# Patient Record
Sex: Male | Born: 1945 | Race: White | Hispanic: No | Marital: Married | Smoking: Former smoker
Health system: Southern US, Community
[De-identification: ages and names within clinical notes are randomized; demographics above are authoritative.]

## PROBLEM LIST (undated history)

## (undated) DIAGNOSIS — Z960 Presence of urogenital implants: Secondary | ICD-10-CM

## (undated) DIAGNOSIS — Z87442 Personal history of urinary calculi: Secondary | ICD-10-CM

## (undated) DIAGNOSIS — Z9289 Personal history of other medical treatment: Secondary | ICD-10-CM

## (undated) DIAGNOSIS — N320 Bladder-neck obstruction: Secondary | ICD-10-CM

## (undated) DIAGNOSIS — I2510 Atherosclerotic heart disease of native coronary artery without angina pectoris: Secondary | ICD-10-CM

## (undated) DIAGNOSIS — Z87448 Personal history of other diseases of urinary system: Secondary | ICD-10-CM

## (undated) DIAGNOSIS — G2581 Restless legs syndrome: Secondary | ICD-10-CM

## (undated) DIAGNOSIS — M1990 Unspecified osteoarthritis, unspecified site: Secondary | ICD-10-CM

## (undated) DIAGNOSIS — N183 Chronic kidney disease, stage 3 unspecified: Secondary | ICD-10-CM

## (undated) DIAGNOSIS — I341 Nonrheumatic mitral (valve) prolapse: Secondary | ICD-10-CM

## (undated) DIAGNOSIS — Z973 Presence of spectacles and contact lenses: Secondary | ICD-10-CM

## (undated) DIAGNOSIS — F32A Depression, unspecified: Secondary | ICD-10-CM

## (undated) DIAGNOSIS — Z87898 Personal history of other specified conditions: Secondary | ICD-10-CM

## (undated) DIAGNOSIS — I700 Atherosclerosis of aorta: Secondary | ICD-10-CM

## (undated) DIAGNOSIS — Z9889 Other specified postprocedural states: Secondary | ICD-10-CM

## (undated) DIAGNOSIS — G250 Essential tremor: Secondary | ICD-10-CM

## (undated) DIAGNOSIS — R911 Solitary pulmonary nodule: Secondary | ICD-10-CM

## (undated) DIAGNOSIS — K649 Unspecified hemorrhoids: Secondary | ICD-10-CM

## (undated) DIAGNOSIS — Z974 Presence of external hearing-aid: Secondary | ICD-10-CM

## (undated) DIAGNOSIS — N400 Enlarged prostate without lower urinary tract symptoms: Secondary | ICD-10-CM

## (undated) DIAGNOSIS — K219 Gastro-esophageal reflux disease without esophagitis: Secondary | ICD-10-CM

## (undated) DIAGNOSIS — I10 Essential (primary) hypertension: Secondary | ICD-10-CM

## (undated) DIAGNOSIS — I38 Endocarditis, valve unspecified: Secondary | ICD-10-CM

## (undated) DIAGNOSIS — Z978 Presence of other specified devices: Secondary | ICD-10-CM

## (undated) DIAGNOSIS — N1330 Unspecified hydronephrosis: Secondary | ICD-10-CM

## (undated) DIAGNOSIS — E559 Vitamin D deficiency, unspecified: Secondary | ICD-10-CM

## (undated) DIAGNOSIS — N529 Male erectile dysfunction, unspecified: Secondary | ICD-10-CM

## (undated) DIAGNOSIS — Z85820 Personal history of malignant melanoma of skin: Secondary | ICD-10-CM

## (undated) DIAGNOSIS — F329 Major depressive disorder, single episode, unspecified: Secondary | ICD-10-CM

## (undated) DIAGNOSIS — F419 Anxiety disorder, unspecified: Secondary | ICD-10-CM

## (undated) DIAGNOSIS — M5416 Radiculopathy, lumbar region: Secondary | ICD-10-CM

## (undated) DIAGNOSIS — R5383 Other fatigue: Secondary | ICD-10-CM

## (undated) HISTORY — DX: Essential (primary) hypertension: I10

## (undated) HISTORY — DX: Depression, unspecified: F32.A

## (undated) HISTORY — DX: Other fatigue: R53.83

## (undated) HISTORY — DX: Radiculopathy, lumbar region: M54.16

## (undated) HISTORY — DX: Vitamin D deficiency, unspecified: E55.9

## (undated) HISTORY — PX: TONSILLECTOMY: SUR1361

## (undated) HISTORY — DX: Endocarditis, valve unspecified: I38

## (undated) HISTORY — DX: Restless legs syndrome: G25.81

## (undated) HISTORY — DX: Major depressive disorder, single episode, unspecified: F32.9

## (undated) HISTORY — DX: Unspecified hemorrhoids: K64.9

## (undated) HISTORY — PX: INGUINAL HERNIA REPAIR: SUR1180

## (undated) HISTORY — PX: FINGER SURGERY: SHX640

## (undated) HISTORY — DX: Male erectile dysfunction, unspecified: N52.9

---

## 1995-04-06 DIAGNOSIS — Z87898 Personal history of other specified conditions: Secondary | ICD-10-CM

## 1995-04-06 HISTORY — DX: Personal history of other specified conditions: Z87.898

## 1995-04-06 HISTORY — PX: TRANSPHENOIDAL PITUITARY RESECTION: SHX2572

## 1997-07-10 ENCOUNTER — Ambulatory Visit (HOSPITAL_COMMUNITY): Admission: RE | Admit: 1997-07-10 | Discharge: 1997-07-10 | Payer: Self-pay | Admitting: Neurosurgery

## 1997-09-23 ENCOUNTER — Encounter: Admission: RE | Admit: 1997-09-23 | Discharge: 1997-09-23 | Payer: Self-pay | Admitting: *Deleted

## 1997-09-23 ENCOUNTER — Emergency Department (HOSPITAL_COMMUNITY): Admission: EM | Admit: 1997-09-23 | Discharge: 1997-09-23 | Payer: Self-pay

## 1998-12-10 ENCOUNTER — Ambulatory Visit (HOSPITAL_COMMUNITY): Admission: RE | Admit: 1998-12-10 | Discharge: 1998-12-10 | Payer: Self-pay | Admitting: Neurosurgery

## 1998-12-10 ENCOUNTER — Encounter: Payer: Self-pay | Admitting: Neurosurgery

## 2001-01-01 ENCOUNTER — Emergency Department (HOSPITAL_COMMUNITY): Admission: EM | Admit: 2001-01-01 | Discharge: 2001-01-01 | Payer: Self-pay | Admitting: Emergency Medicine

## 2001-01-01 ENCOUNTER — Encounter: Payer: Self-pay | Admitting: Emergency Medicine

## 2001-01-09 ENCOUNTER — Ambulatory Visit (HOSPITAL_BASED_OUTPATIENT_CLINIC_OR_DEPARTMENT_OTHER): Admission: RE | Admit: 2001-01-09 | Discharge: 2001-01-09 | Payer: Self-pay | Admitting: Orthopedic Surgery

## 2013-05-10 ENCOUNTER — Other Ambulatory Visit: Payer: Self-pay | Admitting: Nurse Practitioner

## 2013-05-10 ENCOUNTER — Ambulatory Visit
Admission: RE | Admit: 2013-05-10 | Discharge: 2013-05-10 | Disposition: A | Discharge status: Home / Self Care | Payer: PRIVATE HEALTH INSURANCE | Source: Ambulatory Visit | Attending: Nurse Practitioner | Admitting: Nurse Practitioner

## 2013-05-10 DIAGNOSIS — J069 Acute upper respiratory infection, unspecified: Secondary | ICD-10-CM

## 2013-05-11 ENCOUNTER — Other Ambulatory Visit: Payer: Self-pay | Admitting: Internal Medicine

## 2013-05-11 DIAGNOSIS — R911 Solitary pulmonary nodule: Secondary | ICD-10-CM

## 2013-05-12 ENCOUNTER — Encounter: Payer: Self-pay | Admitting: *Deleted

## 2013-05-12 DIAGNOSIS — G2581 Restless legs syndrome: Secondary | ICD-10-CM

## 2013-05-12 DIAGNOSIS — G4700 Insomnia, unspecified: Secondary | ICD-10-CM | POA: Insufficient documentation

## 2013-05-12 DIAGNOSIS — E782 Mixed hyperlipidemia: Secondary | ICD-10-CM | POA: Insufficient documentation

## 2013-05-12 DIAGNOSIS — N400 Enlarged prostate without lower urinary tract symptoms: Secondary | ICD-10-CM | POA: Insufficient documentation

## 2013-05-12 DIAGNOSIS — F32A Depression, unspecified: Secondary | ICD-10-CM | POA: Insufficient documentation

## 2013-05-12 DIAGNOSIS — F329 Major depressive disorder, single episode, unspecified: Secondary | ICD-10-CM | POA: Insufficient documentation

## 2013-05-17 ENCOUNTER — Ambulatory Visit
Admission: RE | Admit: 2013-05-17 | Discharge: 2013-05-17 | Disposition: A | Discharge status: Home / Self Care | Payer: PRIVATE HEALTH INSURANCE | Source: Ambulatory Visit | Attending: Internal Medicine | Admitting: Internal Medicine

## 2013-05-17 DIAGNOSIS — R911 Solitary pulmonary nodule: Secondary | ICD-10-CM

## 2013-05-17 MED ORDER — IOHEXOL 300 MG/ML  SOLN
75.0000 mL | Freq: Once | INTRAMUSCULAR | Status: AC | PRN
  Administered 2013-05-17: 75 mL via INTRAVENOUS

## 2015-04-22 DIAGNOSIS — L814 Other melanin hyperpigmentation: Secondary | ICD-10-CM | POA: Diagnosis not present

## 2015-04-22 DIAGNOSIS — L859 Epidermal thickening, unspecified: Secondary | ICD-10-CM | POA: Diagnosis not present

## 2015-04-22 DIAGNOSIS — D485 Neoplasm of uncertain behavior of skin: Secondary | ICD-10-CM | POA: Diagnosis not present

## 2015-04-22 DIAGNOSIS — L821 Other seborrheic keratosis: Secondary | ICD-10-CM | POA: Diagnosis not present

## 2015-04-22 DIAGNOSIS — T07 Unspecified multiple injuries: Secondary | ICD-10-CM | POA: Diagnosis not present

## 2015-04-22 DIAGNOSIS — Z85820 Personal history of malignant melanoma of skin: Secondary | ICD-10-CM | POA: Diagnosis not present

## 2015-05-27 DIAGNOSIS — L905 Scar conditions and fibrosis of skin: Secondary | ICD-10-CM | POA: Diagnosis not present

## 2015-05-27 DIAGNOSIS — D485 Neoplasm of uncertain behavior of skin: Secondary | ICD-10-CM | POA: Diagnosis not present

## 2015-06-20 DIAGNOSIS — M7022 Olecranon bursitis, left elbow: Secondary | ICD-10-CM | POA: Diagnosis not present

## 2015-06-20 DIAGNOSIS — K648 Other hemorrhoids: Secondary | ICD-10-CM | POA: Diagnosis not present

## 2015-06-20 DIAGNOSIS — K625 Hemorrhage of anus and rectum: Secondary | ICD-10-CM | POA: Diagnosis not present

## 2015-07-18 DIAGNOSIS — L980 Pyogenic granuloma: Secondary | ICD-10-CM | POA: Diagnosis not present

## 2015-07-18 DIAGNOSIS — D485 Neoplasm of uncertain behavior of skin: Secondary | ICD-10-CM | POA: Diagnosis not present

## 2015-08-21 DIAGNOSIS — M25522 Pain in left elbow: Secondary | ICD-10-CM | POA: Diagnosis not present

## 2015-10-06 DIAGNOSIS — I10 Essential (primary) hypertension: Secondary | ICD-10-CM | POA: Diagnosis not present

## 2015-10-06 DIAGNOSIS — E559 Vitamin D deficiency, unspecified: Secondary | ICD-10-CM | POA: Diagnosis not present

## 2015-10-06 DIAGNOSIS — F324 Major depressive disorder, single episode, in partial remission: Secondary | ICD-10-CM | POA: Diagnosis not present

## 2015-10-06 DIAGNOSIS — Z125 Encounter for screening for malignant neoplasm of prostate: Secondary | ICD-10-CM | POA: Diagnosis not present

## 2015-10-06 DIAGNOSIS — N400 Enlarged prostate without lower urinary tract symptoms: Secondary | ICD-10-CM | POA: Diagnosis not present

## 2015-10-06 DIAGNOSIS — Z79899 Other long term (current) drug therapy: Secondary | ICD-10-CM | POA: Diagnosis not present

## 2015-10-06 DIAGNOSIS — D81818 Other biotin-dependent carboxylase deficiency: Secondary | ICD-10-CM | POA: Diagnosis not present

## 2015-10-06 DIAGNOSIS — E782 Mixed hyperlipidemia: Secondary | ICD-10-CM | POA: Diagnosis not present

## 2015-10-06 DIAGNOSIS — Z0001 Encounter for general adult medical examination with abnormal findings: Secondary | ICD-10-CM | POA: Diagnosis not present

## 2015-10-06 DIAGNOSIS — N5201 Erectile dysfunction due to arterial insufficiency: Secondary | ICD-10-CM | POA: Diagnosis not present

## 2015-10-06 DIAGNOSIS — Z1389 Encounter for screening for other disorder: Secondary | ICD-10-CM | POA: Diagnosis not present

## 2015-10-16 DIAGNOSIS — H52223 Regular astigmatism, bilateral: Secondary | ICD-10-CM | POA: Diagnosis not present

## 2015-10-28 DIAGNOSIS — M7022 Olecranon bursitis, left elbow: Secondary | ICD-10-CM | POA: Diagnosis not present

## 2015-10-28 DIAGNOSIS — M659 Synovitis and tenosynovitis, unspecified: Secondary | ICD-10-CM | POA: Diagnosis not present

## 2015-10-28 DIAGNOSIS — D2112 Benign neoplasm of connective and other soft tissue of left upper limb, including shoulder: Secondary | ICD-10-CM | POA: Diagnosis not present

## 2015-10-28 DIAGNOSIS — M6588 Other synovitis and tenosynovitis, other site: Secondary | ICD-10-CM | POA: Diagnosis not present

## 2015-10-28 DIAGNOSIS — R2232 Localized swelling, mass and lump, left upper limb: Secondary | ICD-10-CM | POA: Diagnosis not present

## 2015-10-29 DIAGNOSIS — L905 Scar conditions and fibrosis of skin: Secondary | ICD-10-CM | POA: Diagnosis not present

## 2015-10-29 DIAGNOSIS — Z85820 Personal history of malignant melanoma of skin: Secondary | ICD-10-CM | POA: Diagnosis not present

## 2015-11-10 DIAGNOSIS — R2232 Localized swelling, mass and lump, left upper limb: Secondary | ICD-10-CM | POA: Diagnosis not present

## 2015-11-10 DIAGNOSIS — M7022 Olecranon bursitis, left elbow: Secondary | ICD-10-CM | POA: Diagnosis not present

## 2015-11-19 DIAGNOSIS — M7022 Olecranon bursitis, left elbow: Secondary | ICD-10-CM | POA: Diagnosis not present

## 2015-12-17 DIAGNOSIS — M7022 Olecranon bursitis, left elbow: Secondary | ICD-10-CM | POA: Diagnosis not present

## 2016-01-08 DIAGNOSIS — Z23 Encounter for immunization: Secondary | ICD-10-CM | POA: Diagnosis not present

## 2016-01-27 DIAGNOSIS — L281 Prurigo nodularis: Secondary | ICD-10-CM | POA: Diagnosis not present

## 2016-01-27 DIAGNOSIS — D1801 Hemangioma of skin and subcutaneous tissue: Secondary | ICD-10-CM | POA: Diagnosis not present

## 2016-01-27 DIAGNOSIS — L814 Other melanin hyperpigmentation: Secondary | ICD-10-CM | POA: Diagnosis not present

## 2016-01-27 DIAGNOSIS — D485 Neoplasm of uncertain behavior of skin: Secondary | ICD-10-CM | POA: Diagnosis not present

## 2016-01-27 DIAGNOSIS — L821 Other seborrheic keratosis: Secondary | ICD-10-CM | POA: Diagnosis not present

## 2016-01-27 DIAGNOSIS — L570 Actinic keratosis: Secondary | ICD-10-CM | POA: Diagnosis not present

## 2016-01-27 DIAGNOSIS — Z85820 Personal history of malignant melanoma of skin: Secondary | ICD-10-CM | POA: Diagnosis not present

## 2016-04-28 DIAGNOSIS — D1801 Hemangioma of skin and subcutaneous tissue: Secondary | ICD-10-CM | POA: Diagnosis not present

## 2016-04-28 DIAGNOSIS — L821 Other seborrheic keratosis: Secondary | ICD-10-CM | POA: Diagnosis not present

## 2016-04-28 DIAGNOSIS — L814 Other melanin hyperpigmentation: Secondary | ICD-10-CM | POA: Diagnosis not present

## 2016-04-28 DIAGNOSIS — Z85820 Personal history of malignant melanoma of skin: Secondary | ICD-10-CM | POA: Diagnosis not present

## 2016-05-26 ENCOUNTER — Other Ambulatory Visit: Payer: Self-pay | Admitting: Internal Medicine

## 2016-05-26 DIAGNOSIS — R911 Solitary pulmonary nodule: Secondary | ICD-10-CM

## 2016-05-26 DIAGNOSIS — R42 Dizziness and giddiness: Secondary | ICD-10-CM | POA: Diagnosis not present

## 2016-05-26 DIAGNOSIS — R739 Hyperglycemia, unspecified: Secondary | ICD-10-CM | POA: Diagnosis not present

## 2016-06-03 ENCOUNTER — Ambulatory Visit
Admission: RE | Admit: 2016-06-03 | Discharge: 2016-06-03 | Disposition: A | Discharge status: Home / Self Care | Payer: PPO | Source: Ambulatory Visit | Attending: Internal Medicine | Admitting: Internal Medicine

## 2016-06-03 DIAGNOSIS — R911 Solitary pulmonary nodule: Secondary | ICD-10-CM

## 2016-10-27 DIAGNOSIS — Z85820 Personal history of malignant melanoma of skin: Secondary | ICD-10-CM | POA: Diagnosis not present

## 2016-10-27 DIAGNOSIS — D1801 Hemangioma of skin and subcutaneous tissue: Secondary | ICD-10-CM | POA: Diagnosis not present

## 2016-10-27 DIAGNOSIS — D225 Melanocytic nevi of trunk: Secondary | ICD-10-CM | POA: Diagnosis not present

## 2016-10-27 DIAGNOSIS — L821 Other seborrheic keratosis: Secondary | ICD-10-CM | POA: Diagnosis not present

## 2016-10-27 DIAGNOSIS — L814 Other melanin hyperpigmentation: Secondary | ICD-10-CM | POA: Diagnosis not present

## 2016-11-03 DIAGNOSIS — E559 Vitamin D deficiency, unspecified: Secondary | ICD-10-CM | POA: Diagnosis not present

## 2016-11-03 DIAGNOSIS — R911 Solitary pulmonary nodule: Secondary | ICD-10-CM | POA: Diagnosis not present

## 2016-11-03 DIAGNOSIS — Z79899 Other long term (current) drug therapy: Secondary | ICD-10-CM | POA: Diagnosis not present

## 2016-11-03 DIAGNOSIS — E782 Mixed hyperlipidemia: Secondary | ICD-10-CM | POA: Diagnosis not present

## 2016-11-03 DIAGNOSIS — F329 Major depressive disorder, single episode, unspecified: Secondary | ICD-10-CM | POA: Diagnosis not present

## 2016-11-03 DIAGNOSIS — J309 Allergic rhinitis, unspecified: Secondary | ICD-10-CM | POA: Diagnosis not present

## 2016-11-03 DIAGNOSIS — G2581 Restless legs syndrome: Secondary | ICD-10-CM | POA: Diagnosis not present

## 2016-11-03 DIAGNOSIS — N400 Enlarged prostate without lower urinary tract symptoms: Secondary | ICD-10-CM | POA: Diagnosis not present

## 2016-11-03 DIAGNOSIS — D81818 Other biotin-dependent carboxylase deficiency: Secondary | ICD-10-CM | POA: Diagnosis not present

## 2016-11-03 DIAGNOSIS — I10 Essential (primary) hypertension: Secondary | ICD-10-CM | POA: Diagnosis not present

## 2016-11-03 DIAGNOSIS — F324 Major depressive disorder, single episode, in partial remission: Secondary | ICD-10-CM | POA: Diagnosis not present

## 2016-11-03 DIAGNOSIS — R739 Hyperglycemia, unspecified: Secondary | ICD-10-CM | POA: Diagnosis not present

## 2016-11-03 DIAGNOSIS — F419 Anxiety disorder, unspecified: Secondary | ICD-10-CM | POA: Diagnosis not present

## 2016-11-03 DIAGNOSIS — Z0000 Encounter for general adult medical examination without abnormal findings: Secondary | ICD-10-CM | POA: Diagnosis not present

## 2016-11-03 DIAGNOSIS — N5201 Erectile dysfunction due to arterial insufficiency: Secondary | ICD-10-CM | POA: Diagnosis not present

## 2016-11-03 DIAGNOSIS — Z1389 Encounter for screening for other disorder: Secondary | ICD-10-CM | POA: Diagnosis not present

## 2016-11-03 DIAGNOSIS — B009 Herpesviral infection, unspecified: Secondary | ICD-10-CM | POA: Diagnosis not present

## 2016-11-16 DIAGNOSIS — H903 Sensorineural hearing loss, bilateral: Secondary | ICD-10-CM | POA: Diagnosis not present

## 2016-11-30 DIAGNOSIS — H903 Sensorineural hearing loss, bilateral: Secondary | ICD-10-CM | POA: Diagnosis not present

## 2016-12-13 DIAGNOSIS — F324 Major depressive disorder, single episode, in partial remission: Secondary | ICD-10-CM | POA: Diagnosis not present

## 2016-12-13 DIAGNOSIS — F419 Anxiety disorder, unspecified: Secondary | ICD-10-CM | POA: Diagnosis not present

## 2016-12-16 DIAGNOSIS — Z23 Encounter for immunization: Secondary | ICD-10-CM | POA: Diagnosis not present

## 2017-02-19 DIAGNOSIS — R51 Headache: Secondary | ICD-10-CM | POA: Diagnosis not present

## 2017-02-19 DIAGNOSIS — R399 Unspecified symptoms and signs involving the genitourinary system: Secondary | ICD-10-CM | POA: Diagnosis not present

## 2017-02-19 DIAGNOSIS — R350 Frequency of micturition: Secondary | ICD-10-CM | POA: Diagnosis not present

## 2017-02-19 DIAGNOSIS — I10 Essential (primary) hypertension: Secondary | ICD-10-CM | POA: Diagnosis not present

## 2017-02-21 DIAGNOSIS — I38 Endocarditis, valve unspecified: Secondary | ICD-10-CM | POA: Diagnosis not present

## 2017-02-21 DIAGNOSIS — I10 Essential (primary) hypertension: Secondary | ICD-10-CM | POA: Diagnosis not present

## 2017-03-09 DIAGNOSIS — R011 Cardiac murmur, unspecified: Secondary | ICD-10-CM | POA: Diagnosis not present

## 2017-03-10 HISTORY — PX: TRANSTHORACIC ECHOCARDIOGRAM: SHX275

## 2017-03-21 DIAGNOSIS — N401 Enlarged prostate with lower urinary tract symptoms: Secondary | ICD-10-CM | POA: Diagnosis not present

## 2017-03-21 DIAGNOSIS — I10 Essential (primary) hypertension: Secondary | ICD-10-CM | POA: Diagnosis not present

## 2017-03-21 DIAGNOSIS — I38 Endocarditis, valve unspecified: Secondary | ICD-10-CM | POA: Diagnosis not present

## 2017-04-27 DIAGNOSIS — R140 Abdominal distension (gaseous): Secondary | ICD-10-CM | POA: Diagnosis not present

## 2017-04-27 DIAGNOSIS — I38 Endocarditis, valve unspecified: Secondary | ICD-10-CM | POA: Diagnosis not present

## 2017-04-27 DIAGNOSIS — N401 Enlarged prostate with lower urinary tract symptoms: Secondary | ICD-10-CM | POA: Diagnosis not present

## 2017-04-27 DIAGNOSIS — I10 Essential (primary) hypertension: Secondary | ICD-10-CM | POA: Diagnosis not present

## 2017-04-29 DIAGNOSIS — I10 Essential (primary) hypertension: Secondary | ICD-10-CM | POA: Diagnosis not present

## 2017-04-29 DIAGNOSIS — N183 Chronic kidney disease, stage 3 (moderate): Secondary | ICD-10-CM | POA: Diagnosis not present

## 2017-05-02 DIAGNOSIS — L814 Other melanin hyperpigmentation: Secondary | ICD-10-CM | POA: Diagnosis not present

## 2017-05-02 DIAGNOSIS — Z85820 Personal history of malignant melanoma of skin: Secondary | ICD-10-CM | POA: Diagnosis not present

## 2017-05-02 DIAGNOSIS — D1801 Hemangioma of skin and subcutaneous tissue: Secondary | ICD-10-CM | POA: Diagnosis not present

## 2017-05-02 DIAGNOSIS — L821 Other seborrheic keratosis: Secondary | ICD-10-CM | POA: Diagnosis not present

## 2017-05-12 ENCOUNTER — Ambulatory Visit: Payer: PPO | Admitting: Cardiology

## 2017-05-12 ENCOUNTER — Encounter: Payer: Self-pay | Admitting: Cardiology

## 2017-05-12 ENCOUNTER — Encounter (INDEPENDENT_AMBULATORY_CARE_PROVIDER_SITE_OTHER): Payer: Self-pay

## 2017-05-12 VITALS — BP 138/86 | HR 58 | Ht 69.0 in | Wt 194.2 lb

## 2017-05-12 DIAGNOSIS — I340 Nonrheumatic mitral (valve) insufficiency: Secondary | ICD-10-CM | POA: Diagnosis not present

## 2017-05-12 DIAGNOSIS — I700 Atherosclerosis of aorta: Secondary | ICD-10-CM

## 2017-05-12 DIAGNOSIS — N183 Chronic kidney disease, stage 3 unspecified: Secondary | ICD-10-CM

## 2017-05-12 DIAGNOSIS — R0602 Shortness of breath: Secondary | ICD-10-CM | POA: Diagnosis not present

## 2017-05-12 DIAGNOSIS — I341 Nonrheumatic mitral (valve) prolapse: Secondary | ICD-10-CM | POA: Diagnosis not present

## 2017-05-12 DIAGNOSIS — I2510 Atherosclerotic heart disease of native coronary artery without angina pectoris: Secondary | ICD-10-CM | POA: Diagnosis not present

## 2017-05-12 NOTE — Patient Instructions (Signed)
Medication Instructions:  The current medical regimen is effective;  continue present plan and medications.  Testing/Procedures: Your physician has requested that you have an exercise tolerance test. For further information please visit [email or web address]. Please also follow instruction sheet, as given.  Your physician has requested that you have an echocardiogram in 1 yr. Echocardiography is a painless test that uses sound waves to create images of your heart. It provides your doctor with information about the size and shape of your heart and how well your heart's chambers and valves are working. This procedure takes approximately one hour. There are no restrictions for this procedure.  Follow-Up: Follow up in 6 months with Truitt Merle, NP.  You will receive a letter in the mail 2 months before you are due.  Please call us when you receive this letter to schedule your follow up appointment.  Follow up in 1 year with Dr. Marlou Porch.  You will receive a letter in the mail 2 months before you are due.  Please call us when you receive this letter to schedule your follow up appointment.  If you need a refill on your cardiac medications before your next appointment, please call your pharmacy.  Thank you for choosing [facility]!!

## 2017-05-12 NOTE — Progress Notes (Signed)
Cardiology Office Note:    Date:  05/12/2017   ID:  Lucas Hardy, DOB 1945-09-06, MRN [id number]  PCP:  Josetta Huddle, MD  Cardiologist:  No primary care provider on file.   Referring MD: Josetta Huddle, MD     History of Present Illness:    Lucas Hardy is a 72 y.o. male here for the evaluation of abnormal echocardiogram at the request of Dr. Inda Merlin. CT scan from 06/03/16 demonstrated scattered coronary artery calcification, signs of atherosclerosis.  On physical exam by Dr. Inda Merlin, diastolic murmur was appreciated.  He had a transthoracic echocardiogram performed at [facility] cardiology which demonstrated normal left ventricular cavity size, mild concentric hypertrophy of the left ventricle, EF 60-65% with grade 1 diastolic dysfunction.  Left atrial cavity was mildly dilated, mild thickening of the mitral valve was noted with bileaflet prolapse, moderate.  Mild tricuspid regurgitation.  No evidence of pulmonary hypertension noted.  Unfortunately, since the echocardiogram was performed outside of our facility, I cannot see the images to review personally.  I was able to review the report as above.  His main symptoms seem to be fatigue and indigestion.  Sometimes when resting, he will awaken himself gasping but this is quite rare.  He does experience some dyspnea on exertion but is categorized as mild.  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  He states that he still gets up on ladders and I encouraged him to outsource this work.  He will occasionally have dizziness, when walking the dog for instance he came in one time holding onto the walls went to the bathroom and threw up, vertiginous-like symptoms.  He also smoked for about 10 years.  Recently creatinine increased on valsartan HCT and he was switched back to lisinopril HCT.  Creatinine increased to 1.  9 6.  Blood  pressure under good control.  His GFR dropped from the 70s down to the 30s.  New  Has a history of pituitary tumor resection 1997, depression, ADD, hypertension, BPH, mixed hyperlipidemia.  His father died of prostate cancer. He has had some statin intolerances with Lipitor causing myalgias.  He is now tolerating Crestor.  Past Medical History:  Diagnosis Date  . Arthritis of hand, degenerative   . Bursitis of elbow   . Cerumen impaction   . Depression   . Diastolic murmur   . Dysplastic nevus of trunk   . ED (erectile dysfunction)   . Enlarged prostate   . 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 Fatigue   . Hemorrhoids   . Herpes simplex with complication   . Hypercholesteremia   . Hyperlipidemia   . Hypertension   . Insomnia   . Lightheaded   . Lung nodule   . Memory loss   . Myalgia   . Myositis   . On long term drug therapy   . Pain in joint, hand   . Paresthesias   . Prostatism   . Radiculopathy of lumbar region   . Rectal bleeding   . Restless legs syndrome (RLS)   . Seborrheic keratoses, inflamed   . Sinusitis, acute   . Vitamin D deficiency     History reviewed. No pertinent surgical history.  Current Medications: Current Meds  Medication Sig  . AMLODIPINE BESYLATE PO Take 5 mg by mouth daily.  Marland Kitchen  aspirin 81 MG tablet Take 81 mg by mouth daily.  . Cholecalciferol (VITAMIN D3) 5000 units CAPS Take by mouth 2 (two) times a week.  . Coenzyme Q10 10 MG capsule Take 10 mg by mouth daily.  Marland Kitchen FLUoxetine (PROZAC) 20 MG capsule Take 20 mg by mouth daily.  Marland Kitchen lisinopril-hydrochlorothiazide (PRINZIDE,ZESTORETIC) 10-12.5 MG per tablet Take 1 tablet by mouth daily.  . rosuvastatin (CRESTOR) 20 MG tablet Take 20 mg by mouth daily.  . sildenafil (VIAGRA) 100 MG tablet Take 100 mg by mouth daily as needed for erectile dysfunction.  . tamsulosin (FLOMAX) 0.4 MG CAPS capsule Take 0.4 mg by mouth.     Allergies:   Effexor [venlafaxine]    Social History   Socioeconomic History  . Marital status: Divorced    Spouse name: None  . Number of children: None  . Years of education: None  . Highest education level: None  Social Needs  . Financial resource strain: None  . Food insecurity - worry: None  . Food insecurity - inability: None  . Transportation needs - medical: None  . Transportation needs - non-medical: None  Occupational History  . None  Tobacco Use  . Smoking status: Former Smoker    Last attempt to quit: 04/11/1978    Years since quitting: 39.1  . Smokeless tobacco: Never Used  Substance and Sexual Activity  . Alcohol use: None  . Drug use: None  . Sexual activity: None  Other Topics Concern  . None  Social History Narrative  . None     Family History: The patient's no early family history of CAD.  Interestingly, 1 of his granddaughters has been diagnosed with long QT syndrome.  ROS:   Please see the history of present illness.     All other systems reviewed and are negative.  EKGs/Labs/Other Studies Reviewed:    The following studies were reviewed today: Echo report noted.  March 10, 2017 showing mitral valve prolapse, normal EF, grade 1 diastolic dysfunction, moderate mitral regurgitation  EKG:  EKG is ordered today.  The ekg ordered today demonstrates 05/12/17 sinus bradycardia 54 otherwise unremarkable personally reviewed.  No evidence of prolonged QT.  QTC 398 ms.  Recent Labs: No results found for requested labs within last 8760 hours.  Recent Lipid Panel No results found for: CHOL, TRIG, HDL, CHOLHDL, VLDL, LDLCALC, LDLDIRECT   Total cholesterol 167, HDL 52, LDL 96, triglycerides 94, hemoglobin A1c 5.9, hemoglobin 13.5, creatinine 1.7 down from 1.96, potassium 4.6, ALT 21, TSH 1.6  Physical Exam:    VS:  BP 138/86   Pulse (!) 58   Ht 5\' 9"  (1.753 m)   Wt 194 lb 3.2 oz (88.1 kg)   SpO2 98%   BMI 28.68 kg/m     Wt Readings from Last 3 Encounters:  05/12/17 194 lb 3.2 oz (88.1  kg)     GEN:  Well nourished, well developed in no acute distress HEENT: Normal NECK: No JVD; No carotid bruits LYMPHATICS: No lymphadenopathy CARDIAC: RRR, 1/6 holosystolic murmur apex, no rubs, gallops RESPIRATORY:  Clear to auscultation without rales, wheezing or rhonchi  ABDOMEN: Soft, non-tender, non-distended MUSCULOSKELETAL:  No edema; No deformity  SKIN: Warm and dry NEUROLOGIC:  Alert and oriented x 3 PSYCHIATRIC:  Normal affect   ASSESSMENT:    1. Non-rheumatic mitral regurgitation   2. Aortic atherosclerosis ([place])   3. Coronary artery calcification seen on CT scan   4. Mitral valve prolapse   5. CKD (chronic kidney  disease) stage 3, GFR 30-59 ml/min (HCC)   6. Shortness of breath    PLAN:    In order of problems listed above:  Moderate mitral regurgitation with bileaflet prolapse - March 10, 2017 echocardiogram as above.  Performed at [facility] cardiology.  He is not having any significant symptoms.  Recommendation is to repeat echocardiogram in 1 year given the moderate severity.  We will set this up.  If he does begin to have any worsening shortness of breath, he will let me know.  Coronary artery calcifications -Scattered on CT scan from 06/03/2016.  Aggressive secondary prevention given his coronary atherosclerosis.  I will check an exercise treadmill test to make sure there were no high risk features suggestive of flow-limiting disease.  Aortic atherosclerosis -Aggressive secondary prevention.  Blood pressure control, diet, exercise, lipid management.  He has had myalgias with atorvastatin in the past.  Currently on Crestor 20 mg once a day.  GERD/indigestion -Has been prescribed omeprazole.  He could try holding his aspirin to see if this helps.  Occasional dizziness -Some symptoms sound vertiginous, inner ear like.  It is not constant.  Fatigue - Continue to encourage exercise.  He is a member of [employer].    Medication Adjustments/Labs and  Tests Ordered: Current medicines are reviewed at length with the patient today.  Concerns regarding medicines are outlined above.  Orders Placed This Encounter  Procedures  . Exercise Tolerance Test  . EKG 12-Lead  . ECHOCARDIOGRAM COMPLETE   No orders of the defined types were placed in this encounter.   Signed, Candee Furbish, MD  05/12/2017 10:04 AM    [facility]

## 2017-05-16 ENCOUNTER — Telehealth: Payer: Self-pay

## 2017-05-16 NOTE — Telephone Encounter (Signed)
Called patient to let him know his cardiac clearance will be delayed until he completes his stress test and we receive those results. He verbalized understanding.

## 2017-05-16 NOTE — Telephone Encounter (Signed)
   [facility] Pre-operative Risk Assessment    Request for surgical clearance:  1. What type of surgery is being performed? Removal of spinal lamina  2. When is this surgery scheduled? June 15, 2017   3. Are there any medications that need to be held prior to surgery and how long? Aspirin 63m 7 days prior  4. Practice name and name of physician performing surgery? [facility]  Lucas CroftMD   5. What is your office phone and fax number? Ph [phone] Fax [phone] [name]  Please fax most recent office note, lab work, ekg if available   6. Anesthesia type (None, local, MAC, general) ? General   MTod Persia2/02/2018, 1:10 PM  _________________________________________________________________   (provider comments below)

## 2017-05-16 NOTE — Telephone Encounter (Signed)
   Pt recently seen by Dr. Marlou Porch 05/12/17 and he was ordered to get a ETT to r/o coronary ischemia. This is scheduled for later this month 05/26/17. Cardiac Clearance will be delayed until stress test is completed. If normal/ low risk stress test, then he can be cleared.   Will route to Preop Callback Pool to notify pt that he will need to complete stress test first before he can be cleared.   Please notify pt.

## 2017-05-26 ENCOUNTER — Ambulatory Visit (INDEPENDENT_AMBULATORY_CARE_PROVIDER_SITE_OTHER): Payer: PPO

## 2017-05-26 ENCOUNTER — Encounter: Payer: Self-pay | Admitting: Cardiology

## 2017-05-26 ENCOUNTER — Ambulatory Visit (INDEPENDENT_AMBULATORY_CARE_PROVIDER_SITE_OTHER): Payer: PPO | Admitting: Cardiology

## 2017-05-26 VITALS — BP 120/86 | HR 64 | Ht 69.0 in | Wt 189.1 lb

## 2017-05-26 DIAGNOSIS — R0602 Shortness of breath: Secondary | ICD-10-CM

## 2017-05-26 DIAGNOSIS — R42 Dizziness and giddiness: Secondary | ICD-10-CM

## 2017-05-26 DIAGNOSIS — I2510 Atherosclerotic heart disease of native coronary artery without angina pectoris: Secondary | ICD-10-CM

## 2017-05-26 DIAGNOSIS — Z01812 Encounter for preprocedural laboratory examination: Secondary | ICD-10-CM | POA: Diagnosis not present

## 2017-05-26 DIAGNOSIS — I700 Atherosclerosis of aorta: Secondary | ICD-10-CM | POA: Diagnosis not present

## 2017-05-26 DIAGNOSIS — I341 Nonrheumatic mitral (valve) prolapse: Secondary | ICD-10-CM

## 2017-05-26 DIAGNOSIS — N184 Chronic kidney disease, stage 4 (severe): Secondary | ICD-10-CM

## 2017-05-26 DIAGNOSIS — R9439 Abnormal result of other cardiovascular function study: Secondary | ICD-10-CM | POA: Diagnosis not present

## 2017-05-26 LAB — BASIC METABOLIC PANEL
BUN/Creatinine Ratio: 13 (ref 10–24)
BUN: 32 mg/dL — ABNORMAL HIGH (ref 8–27)
CALCIUM: 9.8 mg/dL (ref 8.6–10.2)
CO2: 23 mmol/L (ref 20–29)
Chloride: 103 mmol/L (ref 96–106)
Creatinine, Ser: 2.48 mg/dL — ABNORMAL HIGH (ref 0.76–1.27)
GFR calc Af Amer: 29 mL/min/{1.73_m2} — ABNORMAL LOW (ref >59)
GFR, EST NON AFRICAN AMERICAN: 25 mL/min/{1.73_m2} — AB (ref >59)
Glucose: 98 mg/dL (ref 65–99)
POTASSIUM: 4.9 mmol/L (ref 3.5–5.2)
Sodium: 141 mmol/L (ref 134–144)

## 2017-05-26 LAB — CBC
HEMATOCRIT: 31.2 % — AB (ref 37.5–51.0)
Hemoglobin: 10.9 g/dL — ABNORMAL LOW (ref 13.0–17.7)
MCH: 30.7 pg (ref 26.6–33.0)
MCHC: 34.9 g/dL (ref 31.5–35.7)
MCV: 88 fL (ref 79–97)
PLATELETS: 232 10*3/uL (ref 150–379)
RBC: 3.55 x10E6/uL — ABNORMAL LOW (ref 4.14–5.80)
RDW: 13.7 % (ref 12.3–15.4)
WBC: 7.2 10*3/uL (ref 3.4–10.8)

## 2017-05-26 LAB — EXERCISE TOLERANCE TEST
CHL CUP MPHR: 150 {beats}/min
CHL CUP RESTING HR STRESS: 56 {beats}/min
Estimated workload: 9.1 METS
Exercise duration (min): 7 min
Exercise duration (sec): 23 s
Peak HR: 133 {beats}/min
Percent HR: 88 %
RPE: 15

## 2017-05-26 LAB — PROTIME-INR
INR: 1.1 (ref 0.8–1.2)
Prothrombin Time: 11 s (ref 9.1–12.0)

## 2017-05-26 MED ORDER — METOPROLOL SUCCINATE ER 25 MG PO TB24: 25.0000 mg | ORAL_TABLET | Freq: Every day | ORAL | 11 refills | Status: DC

## 2017-05-26 NOTE — Progress Notes (Addendum)
Cardiology Office Note:    Date:  05/26/2017   ID:  Lucas Hardy, DOB December 11, 1945, MRN [id number]  PCP:  Josetta Huddle, MD  Cardiologist:  No primary care provider on file.   Referring MD: Josetta Huddle, MD     History of Present Illness:    Lucas Hardy is a 72 y.o. male here for the evaluation of abnormal echocardiogram at the request of Dr. Inda Merlin. CT scan from 06/03/16 demonstrated scattered coronary artery calcification, signs of atherosclerosis.  On physical exam by Dr. Inda Merlin, diastolic murmur was appreciated.  He had a transthoracic echocardiogram performed at [facility] cardiology which demonstrated normal left ventricular cavity size, mild concentric hypertrophy of the left ventricle, EF 60-65% with grade 1 diastolic dysfunction.  Left atrial cavity was mildly dilated, mild thickening of the mitral valve was noted with bileaflet prolapse, moderate.  Mild tricuspid regurgitation.  No evidence of pulmonary hypertension noted.  Unfortunately, since the echocardiogram was performed outside of our facility, I cannot see the images to review personally.  I was able to review the report as above.  His main symptoms seem to be fatigue and indigestion.  Sometimes when resting, he will awaken himself gasping but this is quite rare.  He does experience some dyspnea on exertion but is categorized as mild.  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  He states that he still gets up on ladders and I encouraged him to outsource this work.  He will occasionally have dizziness, when walking the dog for instance he came in one time holding onto the walls went to the bathroom and threw up, vertiginous-like symptoms.  He also smoked for about 10 years.  Recently creatinine increased on valsartan HCT and he was switched back to lisinopril HCT.  Creatinine increased to 1.  9 6.  Blood  pressure under good control.  His GFR dropped from the 70s down to the 30s.  New  Has a history of pituitary tumor resection 1997, depression, ADD, hypertension, BPH, mixed hyperlipidemia.  His father died of prostate cancer. He has had some statin intolerances with Lipitor causing myalgias.  He is now tolerating Crestor.  05/26/17 - Abnormal ETT, ST depression noted. No chest pain. We discussed with his wife and set up cath.   Past Medical History:  Diagnosis Date  . Arthritis of hand, degenerative   . Bursitis of elbow   . Cerumen impaction   . Depression   . Diastolic murmur   . Dysplastic nevus of trunk   . ED (erectile dysfunction)   . Enlarged prostate   . 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 Fatigue   . Hemorrhoids   . Herpes simplex with complication   . Hypercholesteremia   . Hyperlipidemia   . Hypertension   . Insomnia   . Lightheaded   . Lung nodule   . Memory loss   . Myalgia   . Myositis   . On long term drug therapy   . Pain in joint, hand   . Paresthesias   . Prostatism   . Radiculopathy of lumbar region   . Rectal bleeding   . Restless legs syndrome (RLS)   . Seborrheic keratoses, inflamed   . Sinusitis, acute   . Vitamin D deficiency     No past surgical history on file.  Current Medications: No outpatient medications have been marked as taking for the 05/26/17 encounter (Office Visit) with Jerline Pain, MD.     Allergies:   Effexor [venlafaxine]   Social History   Socioeconomic History  . Marital status: Divorced    Spouse name: None  . Number of children: None  . Years of education: None  . Highest education level: None  Social Needs  . Financial resource strain: None  . Food insecurity - worry: None  . Food insecurity - inability: None  . Transportation needs - medical: None  . Transportation needs - non-medical: None  Occupational History  . None  Tobacco Use  . Smoking status: Former Smoker      Last attempt to quit: 04/11/1978    Years since quitting: 39.1  . Smokeless tobacco: Never Used  Substance and Sexual Activity  . Alcohol use: None  . Drug use: None  . Sexual activity: None  Other Topics Concern  . None  Social History Narrative  . None     Family History: The patient's no early family history of CAD.  Interestingly, 1 of his granddaughters has been diagnosed with long QT syndrome.  ROS:   Please see the history of present illness.     All other systems reviewed and are negative.  EKGs/Labs/Other Studies Reviewed:    The following studies were reviewed today: Echo report noted.  March 10, 2017 showing mitral valve prolapse, normal EF, grade 1 diastolic dysfunction, moderate mitral regurgitation  EKG:  EKG is ordered today.  The ekg ordered today demonstrates 05/12/17 sinus bradycardia 54 otherwise unremarkable personally reviewed.  No evidence of prolonged QT.  QTC 398 ms.  Recent Labs: No results found for requested labs within last 8760 hours.  Recent Lipid Panel No results found for: CHOL, TRIG, HDL, CHOLHDL, VLDL, LDLCALC, LDLDIRECT   Total cholesterol 167, HDL 52, LDL 96, triglycerides 94, hemoglobin A1c 5.9, hemoglobin 13.5, creatinine 1.7 down from 1.96, potassium 4.6, ALT 21, TSH 1.6  Physical Exam:    VS:  BP 120/86   Pulse 64   Ht 5\' 9"  (1.753 m)   Wt 189 lb 1.9 oz (85.8 kg)   BMI 27.93 kg/m     Wt Readings from Last 3 Encounters:  05/26/17 189 lb 1.9 oz (85.8 kg)  05/12/17 194 lb 3.2 oz (88.1 kg)     GEN: Well nourished, well developed, in no acute distress  HEENT: normal  Neck: no JVD, carotid bruits, or masses Cardiac: RRR; 1/6 S murmurs, no rubs, or gallops,no edema  Respiratory:  clear to auscultation bilaterally, normal work of breathing GI: soft, nontender, nondistended, + BS MS: no deformity or atrophy  Skin: warm and dry, no rash Neuro:  Alert and Oriented x 3, Strength and sensation are intact Psych: euthymic mood, full  affect   ASSESSMENT:    1. Pre-operative laboratory examination   2. Abnormal cardiovascular stress test   3. Dizziness   4. Aortic atherosclerosis ([place])   5. Coronary artery calcification seen on CT scan   6. Mitral valve prolapse    PLAN:    In order of problems listed above:  Moderate mitral regurgitation with bileaflet prolapse - March 10, 2017 echocardiogram as above.  Performed at [facility] cardiology.  He is not having any significant symptoms.  Recommendation is to repeat echocardiogram in 1 year given the moderate severity.  We will set this up.  If he does begin to have any worsening shortness of breath,  he will let me know. Awaiting results.   Coronary artery calcifications/abnormal exercise treadmill test -Scattered on CT scan from 06/03/2016.  Aggressive secondary prevention given his coronary atherosclerosis.  Exercise treadmill test abnormal ST depression. Checking cath.  Risks and benefits discussed including stroke, heart attack, renal impairment, bleeding.  He is willing to proceed.  Radial artery approach.  CKD 4 -Creatinine seems to continue to remain elevated, currently 2.4-1.9.  After receiving these labs, I have decided to discontinue his lisinopril hydrochlorothiazide.  We have added Toprol 25 mg once a day.  He will continue with his amlodipine.  Other agents to consider include hydralazine.  We will go ahead and admit him the day prior to cardiac catheterization for hydration.  Aortic atherosclerosis -Aggressive secondary prevention.  Blood pressure control, diet, exercise, lipid management.  He has had myalgias with atorvastatin in the past.  Currently on Crestor 20 mg once a day.  No changes  GERD/indigestion -Has been prescribed omeprazole.  He could try holding his aspirin to see if this helps.  No changes.  Interestingly he has had some morning nausea, sometimes ends up vomiting.  Occasional dizziness -Some symptoms sound vertiginous, inner ear like.  It  is not constant.   Medication Adjustments/Labs and Tests Ordered: Current medicines are reviewed at length with the patient today.  Concerns regarding medicines are outlined above.  Orders Placed This Encounter  Procedures  . CBC  . Basic metabolic panel  . Protime-INR   No orders of the defined types were placed in this encounter.   Signed, Candee Furbish, MD  05/26/2017 12:11 PM    [facility]

## 2017-05-26 NOTE — Patient Instructions (Addendum)
Medication Instructions:  The current medical regimen is effective;  continue present plan and medications.  Labwork: Please have blood work today (CBC, BMP and PT/INR)  Testing/Procedures: Your physician has requested that you have a cardiac catheterization. Cardiac catheterization is used to diagnose and/or treat various heart conditions. Doctors may recommend this procedure for a number of different reasons. The most common reason is to evaluate chest pain. Chest pain can be a symptom of coronary artery disease (CAD), and cardiac catheterization can show whether plaque is narrowing or blocking your heart's arteries. This procedure is also used to evaluate the valves, as well as measure the blood flow and oxygen levels in different parts of your heart. For further information please visit HugeFiesta.tn. Please follow instruction sheet, as given.  Follow-Up: Follow up after your cardiac cath.  If you need a refill on your cardiac medications before your next appointment, please call your pharmacy.  Thank you for choosing [facility]!!      [place] OFFICE [address] [place] [address] Dept: [phone] Loc: [phone]  Lucas Hardy  05/26/2017  You are scheduled for a cardiac cath on Tuesday, February 26. 2019 with Dr. Peter Martinique.  1. Please arrive at the [facility] (Main Entrance A) at [facility]: [address] [place], [place] [address] at 10 AM (two hours before your procedure to ensure your preparation). Free valet parking service is available.   Special note: Every effort is made to have your procedure done on time. Please understand that emergencies sometimes delay scheduled procedures.  2. Diet: Nothing to eat or drink after midnight other than sips of water with your medications.  3. Labs: Please have blood work today.  4. Medication  instructions in preparation for your procedure:     You may take your medications as listed with sips of water.  Please make sure to take your ASA. 5. Plan for one night stay--bring personal belongings. 6. Bring a current list of your medications and current insurance cards. 7. You MUST have a responsible person to drive you home. 8. Someone MUST be with you the first 24 hours after you arrive home or your discharge will be delayed. 9. Please wear clothes that are easy to get on and off and wear slip-on shoes.  Thank you for allowing [place] to care for you!   -- [facility]

## 2017-05-26 NOTE — Addendum Note (Signed)
Addended by: Shellia Cleverly on: 05/26/2017 05:52 PM   Modules accepted: Orders

## 2017-05-27 ENCOUNTER — Telehealth: Payer: Self-pay | Admitting: Cardiovascular Disease

## 2017-05-27 NOTE — Telephone Encounter (Signed)
Lucas Hardy is calling because Lucas Hardy States he did not hear the times you told him for his appt  For the procedure . Please call

## 2017-05-27 NOTE — Telephone Encounter (Signed)
Called and spoke with wife.   Pt is very HOH, [place] and was unsure who called him to give instructions about his upcoming admission prior to cardiac cath.  I called and spoke with Robin in Pt Placement and she has not called.   Called back and spoke with wife who is aware they will received a call on Monday when the bed assignment has been made and is ready.  She is aware to pack a bag to stay over night and he will need to report to admitting when called.  They will c/b if further questions.  She thanked me for the call.

## 2017-05-27 NOTE — Telephone Encounter (Signed)
   Pt had stress test yesterday which was abnormal. He has been set up for [facility] with Dr. Martinique on 05/31/17. Clearance remains on hold at this time. We will make final decision post cath. Obviously if he requires coronary PCI + stenting, surgery will need to be delayed until he has completed recommended duration of DAPT. If no indication for PCI / significant CAD findings, then we can clear for surgery.  Lyda Jester, PA-C

## 2017-05-30 ENCOUNTER — Other Ambulatory Visit: Payer: Self-pay

## 2017-05-30 ENCOUNTER — Observation Stay (HOSPITAL_COMMUNITY)
Admission: RE | Admit: 2017-05-30 | Discharge: 2017-05-31 | Disposition: A | Discharge status: Home / Self Care | Payer: PPO | Source: Ambulatory Visit | Attending: Interventional Cardiology | Admitting: Interventional Cardiology

## 2017-05-30 ENCOUNTER — Encounter (HOSPITAL_COMMUNITY): Payer: Self-pay | Admitting: General Practice

## 2017-05-30 DIAGNOSIS — Z87891 Personal history of nicotine dependence: Secondary | ICD-10-CM | POA: Insufficient documentation

## 2017-05-30 DIAGNOSIS — R079 Chest pain, unspecified: Principal | ICD-10-CM | POA: Insufficient documentation

## 2017-05-30 DIAGNOSIS — I700 Atherosclerosis of aorta: Secondary | ICD-10-CM | POA: Insufficient documentation

## 2017-05-30 DIAGNOSIS — I129 Hypertensive chronic kidney disease with stage 1 through stage 4 chronic kidney disease, or unspecified chronic kidney disease: Secondary | ICD-10-CM | POA: Diagnosis not present

## 2017-05-30 DIAGNOSIS — N400 Enlarged prostate without lower urinary tract symptoms: Secondary | ICD-10-CM | POA: Insufficient documentation

## 2017-05-30 DIAGNOSIS — I2510 Atherosclerotic heart disease of native coronary artery without angina pectoris: Secondary | ICD-10-CM | POA: Insufficient documentation

## 2017-05-30 DIAGNOSIS — R9431 Abnormal electrocardiogram [ECG] [EKG]: Secondary | ICD-10-CM | POA: Diagnosis present

## 2017-05-30 DIAGNOSIS — I340 Nonrheumatic mitral (valve) insufficiency: Secondary | ICD-10-CM | POA: Insufficient documentation

## 2017-05-30 DIAGNOSIS — Z7982 Long term (current) use of aspirin: Secondary | ICD-10-CM | POA: Diagnosis not present

## 2017-05-30 DIAGNOSIS — N183 Chronic kidney disease, stage 3 (moderate): Secondary | ICD-10-CM | POA: Diagnosis not present

## 2017-05-30 DIAGNOSIS — R9439 Abnormal result of other cardiovascular function study: Secondary | ICD-10-CM | POA: Diagnosis present

## 2017-05-30 DIAGNOSIS — Z8042 Family history of malignant neoplasm of prostate: Secondary | ICD-10-CM | POA: Diagnosis not present

## 2017-05-30 DIAGNOSIS — N184 Chronic kidney disease, stage 4 (severe): Secondary | ICD-10-CM | POA: Insufficient documentation

## 2017-05-30 DIAGNOSIS — I341 Nonrheumatic mitral (valve) prolapse: Secondary | ICD-10-CM | POA: Insufficient documentation

## 2017-05-30 DIAGNOSIS — Z79899 Other long term (current) drug therapy: Secondary | ICD-10-CM | POA: Insufficient documentation

## 2017-05-30 DIAGNOSIS — I2584 Coronary atherosclerosis due to calcified coronary lesion: Secondary | ICD-10-CM | POA: Diagnosis not present

## 2017-05-30 DIAGNOSIS — F329 Major depressive disorder, single episode, unspecified: Secondary | ICD-10-CM | POA: Insufficient documentation

## 2017-05-30 DIAGNOSIS — E782 Mixed hyperlipidemia: Secondary | ICD-10-CM | POA: Insufficient documentation

## 2017-05-30 HISTORY — DX: Anxiety disorder, unspecified: F41.9

## 2017-05-30 HISTORY — DX: Gastro-esophageal reflux disease without esophagitis: K21.9

## 2017-05-30 LAB — CBC
HCT: 29 % — ABNORMAL LOW (ref 39.0–52.0)
HEMOGLOBIN: 9.9 g/dL — AB (ref 13.0–17.0)
MCH: 31 pg (ref 26.0–34.0)
MCHC: 34.1 g/dL (ref 30.0–36.0)
MCV: 90.9 fL (ref 78.0–100.0)
Platelets: 203 10*3/uL (ref 150–400)
RBC: 3.19 MIL/uL — ABNORMAL LOW (ref 4.22–5.81)
RDW: 13.4 % (ref 11.5–15.5)
WBC: 7.5 10*3/uL (ref 4.0–10.5)

## 2017-05-30 LAB — BASIC METABOLIC PANEL
ANION GAP: 12 (ref 5–15)
BUN: 36 mg/dL — ABNORMAL HIGH (ref 6–20)
CALCIUM: 8.8 mg/dL — AB (ref 8.9–10.3)
CHLORIDE: 103 mmol/L (ref 101–111)
CO2: 23 mmol/L (ref 22–32)
Creatinine, Ser: 2.33 mg/dL — ABNORMAL HIGH (ref 0.61–1.24)
GFR calc non Af Amer: 27 mL/min — ABNORMAL LOW (ref >60)
GFR, EST AFRICAN AMERICAN: 31 mL/min — AB (ref >60)
Glucose, Bld: 98 mg/dL (ref 65–99)
Potassium: 4.3 mmol/L (ref 3.5–5.1)
SODIUM: 138 mmol/L (ref 135–145)

## 2017-05-30 MED ORDER — HEPARIN SODIUM (PORCINE) 5000 UNIT/ML IJ SOLN
5000.0000 [IU] | Freq: Three times a day (TID) | INTRAMUSCULAR | Status: DC
  Administered 2017-05-30 – 2017-05-31 (×3): 5000 [IU] via SUBCUTANEOUS
  Filled 2017-05-30 (×3): qty 1

## 2017-05-30 MED ORDER — ACETAMINOPHEN 325 MG PO TABS: 650.0000 mg | ORAL_TABLET | ORAL | Status: DC | PRN

## 2017-05-30 MED ORDER — ASPIRIN EC 81 MG PO TBEC
81.0000 mg | DELAYED_RELEASE_TABLET | Freq: Every day | ORAL | Status: DC
  Administered 2017-05-30: 81 mg via ORAL
  Filled 2017-05-30: qty 1

## 2017-05-30 MED ORDER — ROSUVASTATIN CALCIUM 20 MG PO TABS
20.0000 mg | ORAL_TABLET | Freq: Every day | ORAL | Status: DC
  Administered 2017-05-31: 20 mg via ORAL
  Filled 2017-05-30 (×2): qty 1

## 2017-05-30 MED ORDER — ASPIRIN 81 MG PO CHEW
81.0000 mg | CHEWABLE_TABLET | ORAL | Status: AC
  Administered 2017-05-31: 81 mg via ORAL
  Filled 2017-05-30: qty 1

## 2017-05-30 MED ORDER — AMLODIPINE BESYLATE 5 MG PO TABS
5.0000 mg | ORAL_TABLET | Freq: Every day | ORAL | Status: DC
  Administered 2017-05-31: 5 mg via ORAL
  Filled 2017-05-30: qty 1

## 2017-05-30 MED ORDER — NITROGLYCERIN 0.4 MG SL SUBL: 0.4000 mg | SUBLINGUAL_TABLET | SUBLINGUAL | Status: DC | PRN

## 2017-05-30 MED ORDER — METOPROLOL SUCCINATE ER 25 MG PO TB24
25.0000 mg | ORAL_TABLET | Freq: Every day | ORAL | Status: DC
  Filled 2017-05-30: qty 1

## 2017-05-30 MED ORDER — ZOLPIDEM TARTRATE 5 MG PO TABS
5.0000 mg | ORAL_TABLET | Freq: Once | ORAL | Status: AC
  Administered 2017-05-31: 5 mg via ORAL
  Filled 2017-05-30: qty 1

## 2017-05-30 MED ORDER — SODIUM CHLORIDE 0.9 % IV SOLN: 250.0000 mL | INTRAVENOUS | Status: DC | PRN

## 2017-05-30 MED ORDER — TAMSULOSIN HCL 0.4 MG PO CAPS
0.4000 mg | ORAL_CAPSULE | Freq: Every day | ORAL | Status: DC
  Administered 2017-05-30 – 2017-05-31 (×2): 0.4 mg via ORAL
  Filled 2017-05-30 (×2): qty 1

## 2017-05-30 MED ORDER — ONDANSETRON HCL 4 MG/2ML IJ SOLN: 4.0000 mg | Freq: Four times a day (QID) | INTRAMUSCULAR | Status: DC | PRN

## 2017-05-30 MED ORDER — SODIUM CHLORIDE 0.9% FLUSH: 3.0000 mL | Freq: Two times a day (BID) | INTRAVENOUS | Status: DC

## 2017-05-30 MED ORDER — SODIUM CHLORIDE 0.9 % IV SOLN
INTRAVENOUS | Status: DC
  Administered 2017-05-30: 75 mL/h via INTRAVENOUS
  Administered 2017-05-31: 02:00:00 via INTRAVENOUS

## 2017-05-30 MED ORDER — SODIUM CHLORIDE 0.9% FLUSH: 3.0000 mL | INTRAVENOUS | Status: DC | PRN

## 2017-05-30 MED ORDER — FLUOXETINE HCL 20 MG PO CAPS
20.0000 mg | ORAL_CAPSULE | Freq: Every day | ORAL | Status: DC
  Administered 2017-05-31: 20 mg via ORAL
  Filled 2017-05-30: qty 1

## 2017-05-30 NOTE — H&P (Addendum)
H&P:   Date:  05/30/2017   ID:  Lucas Hardy, DOB 05/01/1945, MRN [id number]  PCP:  Josetta Huddle, MD  Cardiologist:  No primary care provider on file.   Referring MD: No ref. provider found     History of Present Illness:    Lucas Hardy is a 72 y.o. male here for the evaluation of abnormal echocardiogram at the request of Dr. Inda Merlin. CT scan from 06/03/16 demonstrated scattered coronary artery calcification, signs of atherosclerosis.  On physical exam by Dr. Inda Merlin, diastolic murmur was appreciated.  He had a transthoracic echocardiogram performed at [facility] cardiology which demonstrated normal left ventricular cavity size, mild concentric hypertrophy of the left ventricle, EF 60-65% with grade 1 diastolic dysfunction.  Left atrial cavity was mildly dilated, mild thickening of the mitral valve was noted with bileaflet prolapse, moderate.  Mild tricuspid regurgitation.  No evidence of pulmonary hypertension noted.  Unfortunately, since the echocardiogram was performed outside of our facility, I cannot see the images to review personally.  I was able to review the report as above.  His main symptoms seem to be fatigue and indigestion.  Sometimes when resting, he will awaken himself gasping but this is quite rare.  He does experience some dyspnea on exertion but is categorized as mild.  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  He states that he still gets up on ladders and I encouraged him to outsource this work.  He will occasionally have dizziness, when walking the dog for instance he came in one time holding onto the walls went to the bathroom and threw up, vertiginous-like symptoms.  He also smoked for about 10 years.  Recently creatinine increased on valsartan HCT and he was switched back to lisinopril HCT.  Creatinine increased to 1.96.  Blood pressure under  good control.  His GFR dropped from the 70s down to the 30s.   Has a history of pituitary tumor resection 1997, depression, ADD, hypertension, BPH, mixed hyperlipidemia.  His father died of prostate cancer. He has had some statin intolerances with Lipitor causing myalgias.  He is now tolerating Crestor.  05/26/17 - Abnormal ETT, ST depression noted. No chest pain. We discussed with his wife and set up cath.   Past Medical History:  Diagnosis Date  . Arthritis of hand, degenerative   . Bursitis of elbow   . Cerumen impaction   . Depression   . Diastolic murmur   . Dysplastic nevus of trunk   . ED (erectile dysfunction)   . Enlarged prostate   . 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 Fatigue   . Hemorrhoids   . Herpes simplex with complication   . Hypercholesteremia   . Hyperlipidemia   . Hypertension   . Insomnia   . Lightheaded   . Lung nodule   . Memory loss   . Myalgia   . Myositis   . On long term drug therapy   . Pain in joint, hand   . Paresthesias   . Prostatism   . Radiculopathy of lumbar region   . Rectal bleeding   . Restless legs syndrome (RLS)   . Seborrheic keratoses, inflamed   . Sinusitis, acute   . Vitamin D deficiency     No past surgical history on file.  Current Medications: Current Meds  Medication Sig  . amLODipine (NORVASC) 5 MG tablet Take 5 mg by mouth daily.  Marland Kitchen aspirin 81 MG tablet Take 81 mg by mouth daily.  . Cholecalciferol 2000 units CAPS Take 2,000 Units by mouth daily.  . Coenzyme Q10 10 MG capsule Take 10 mg by mouth daily.  Marland Kitchen FLUoxetine (PROZAC) 20 MG capsule Take 20 mg by mouth daily.  . Multiple Vitamin (MULTIVITAMIN WITH MINERALS) TABS tablet Take 1 tablet by mouth daily.  . rosuvastatin (CRESTOR) 20 MG tablet Take 20 mg by mouth daily.  . sildenafil (VIAGRA) 100 MG tablet Take 100 mg by mouth daily as needed for erectile dysfunction.  . tamsulosin (FLOMAX) 0.4 MG CAPS capsule Take 0.4 mg by  mouth daily.   . [DISCONTINUED] lisinopril-hydrochlorothiazide (PRINZIDE,ZESTORETIC) 10-12.5 MG per tablet Take 1 tablet by mouth daily.     Allergies:   Effexor [venlafaxine]   Social History   Socioeconomic History  . Marital status: Married    Spouse name: Not on file  . Number of children: Not on file  . Years of education: Not on file  . Highest education level: Not on file  Social Needs  . Financial resource strain: Not on file  . Food insecurity - worry: Not on file  . Food insecurity - inability: Not on file  . Transportation needs - medical: Not on file  . Transportation needs - non-medical: Not on file  Occupational History  . Not on file  Tobacco Use  . Smoking status: Former Smoker    Last attempt to quit: 04/11/1978    Years since quitting: 39.1  . Smokeless tobacco: Never Used  Substance and Sexual Activity  . Alcohol use: Not on file  . Drug use: Not on file  . Sexual activity: Not on file  Other Topics Concern  . Not on file  Social History Narrative  . Not on file     Family History: The patient's no early family history of CAD.  Interestingly, 1 of his granddaughters has been diagnosed with long QT syndrome.  ROS:   Please see the history of present illness.     All other systems reviewed and are negative.  EKGs/Labs/Other Studies Reviewed:    The following studies were reviewed today: Echo report noted.  March 10, 2017 showing mitral valve prolapse, normal EF, grade 1 diastolic dysfunction, moderate mitral regurgitation  EKG:  EKG 05/26/17.  The ekg ordered today demonstrates 05/12/17 sinus bradycardia 54 otherwise unremarkable personally reviewed.  No evidence of prolonged QT.  QTC 398 ms.  Recent Labs: 05/26/2017: BUN 32; Creatinine, Ser 2.48; Hemoglobin 10.9; Platelets 232; Potassium 4.9; Sodium 141  Recent Lipid Panel No results found for: CHOL, TRIG, HDL, CHOLHDL, VLDL, LDLCALC, LDLDIRECT   Total cholesterol 167, HDL 52, LDL 96,  triglycerides 94, hemoglobin A1c 5.9, hemoglobin 13.5, creatinine 1.7 down from 1.96, potassium 4.6, ALT 21, TSH 1.6  Physical Exam:    VS:  BP 136/69 (BP Location: Left Arm)   Pulse (!) 54   Temp 98.7 F (37.1 C) (Oral)   Resp 18   Ht 5\' 10"  (1.778 m)   Wt 190 lb 12.8 oz (86.5 kg)   SpO2 98%   BMI 27.38 kg/m     Wt Readings from Last 3 Encounters:  05/30/17 190 lb 12.8 oz (86.5 kg)  05/26/17 189 lb 1.9 oz (85.8 kg)  05/12/17 194 lb 3.2 oz (88.1 kg)     GEN: Well nourished, well developed, in no acute distress  HEENT: normal  Neck: no JVD, carotid bruits, or masses Cardiac: RRR; 1/6 S murmurs, no rubs, or gallops,no edema  Respiratory:  clear to auscultation bilaterally, normal work of breathing GI: soft, nontender, nondistended, + BS MS: no deformity or atrophy  Skin: warm and dry, no rash Neuro:  Alert and Oriented x 3, Strength and sensation are intact Psych: euthymic mood, full affect   ASSESSMENT:    1. Pre-operative laboratory examination   2. Abnormal cardiovascular stress test   3. Dizziness   4. Aortic atherosclerosis ([place])   5. Coronary artery calcification seen on CT scan   6. Mitral valve prolapse     PLAN:    In order of problems listed above:  Moderate mitral regurgitation with bileaflet prolapse - March 10, 2017 echocardiogram as above.  Performed at [facility].  He is not having any significant symptoms.  Recommendation is to repeat echocardiogram in 1 year given the moderate severity.  We will set this up.  If he does begin to have any worsening shortness of breath, he will let me know. Awaiting results.   Coronary artery calcifications/abnormal exercise treadmill test -Scattered on CT scan from 06/03/2016.  Aggressive secondary prevention given his coronary atherosclerosis.  Exercise treadmill test abnormal ST depression. Checking cath.  Risks and benefits discussed including stroke, heart attack, renal impairment, bleeding.  He is willing  to proceed.  Radial artery approach.  CKD 4 -Creatinine seems to continue to remain elevated, currently 2.4-1.9.  After receiving these labs, I have decided to discontinue his lisinopril hydrochlorothiazide.  We have added Toprol 25 mg once a day.  He will continue with his amlodipine.  Other agents to consider include hydralazine.  We will go ahead and admit him the day prior to cardiac catheterization for hydration.  Aortic atherosclerosis -Aggressive secondary prevention.  Blood pressure control, diet, exercise, lipid management.  He has had myalgias with atorvastatin in the past.  Currently on Crestor 20 mg once a day.  No changes  GERD/indigestion -Has been prescribed omeprazole.  He could try holding his aspirin to see if this helps.  No changes.  Interestingly he has had some morning nausea, sometimes ends up vomiting.  Occasional dizziness -Some symptoms sound vertiginous, inner ear like.  It is not constant.  Signed, Candee Furbish, MD 05/26/17 12:11pm [facility]  Addendum:  Mr. Catoe has been direct admitted for pre-hydration IVFs for cath in the am. No chest pain admission. Labs on 05/26/17 showed Cr 2.48. He was instructed to stop his ACEi and placed on BB therapy instead.  -- check BMET now and in the am -- start IVFs 75cc/hr now. Cath orders placed. Normal EF on echo from 12/18.  -- The patient understands that risks included but are not limited to stroke (1 in 1000), death (1 in 1000), kidney failure [usually temporary] (1 in 500), bleeding (1 in 200), allergic reaction [possibly serious] (1 in 200).   SignedReino Bellis, NP-C 05/30/2017, 3:45 PM Pager: [phone]  I have examined the patient and reviewed assessment and plan and discussed with patient.  Agree with above as stated.  Patient with CRI here for pre-hydration in the setting of cath.  Check labs in AM.  All questions about cath answered.   Larae Grooms

## 2017-05-31 ENCOUNTER — Encounter (HOSPITAL_COMMUNITY): Payer: Self-pay | Admitting: Cardiology

## 2017-05-31 ENCOUNTER — Ambulatory Visit (HOSPITAL_COMMUNITY)
Admission: RE | Disposition: A | Discharge status: Home / Self Care | Payer: Self-pay | Source: Ambulatory Visit | Attending: Interventional Cardiology

## 2017-05-31 DIAGNOSIS — R079 Chest pain, unspecified: Secondary | ICD-10-CM

## 2017-05-31 HISTORY — PX: LEFT HEART CATH AND CORONARY ANGIOGRAPHY: CATH118249

## 2017-05-31 LAB — CBC
HCT: 28.9 % — ABNORMAL LOW (ref 39.0–52.0)
Hemoglobin: 9.7 g/dL — ABNORMAL LOW (ref 13.0–17.0)
MCH: 30.2 pg (ref 26.0–34.0)
MCHC: 33.6 g/dL (ref 30.0–36.0)
MCV: 90 fL (ref 78.0–100.0)
PLATELETS: 200 10*3/uL (ref 150–400)
RBC: 3.21 MIL/uL — ABNORMAL LOW (ref 4.22–5.81)
RDW: 13.1 % (ref 11.5–15.5)
WBC: 6.9 10*3/uL (ref 4.0–10.5)

## 2017-05-31 LAB — BASIC METABOLIC PANEL
Anion gap: 10 (ref 5–15)
BUN: 37 mg/dL — ABNORMAL HIGH (ref 6–20)
CALCIUM: 8.8 mg/dL — AB (ref 8.9–10.3)
CO2: 21 mmol/L — AB (ref 22–32)
CREATININE: 2.24 mg/dL — AB (ref 0.61–1.24)
Chloride: 110 mmol/L (ref 101–111)
GFR calc Af Amer: 32 mL/min — ABNORMAL LOW (ref >60)
GFR calc non Af Amer: 28 mL/min — ABNORMAL LOW (ref >60)
GLUCOSE: 96 mg/dL (ref 65–99)
Potassium: 4 mmol/L (ref 3.5–5.1)
Sodium: 141 mmol/L (ref 135–145)

## 2017-05-31 SURGERY — LEFT HEART CATH AND CORONARY ANGIOGRAPHY
Anesthesia: LOCAL

## 2017-05-31 MED ORDER — IOPAMIDOL (ISOVUE-370) INJECTION 76%
INTRAVENOUS | Status: DC | PRN
  Administered 2017-05-31: 30 mL via INTRA_ARTERIAL

## 2017-05-31 MED ORDER — MIDAZOLAM HCL 2 MG/2ML IJ SOLN
INTRAMUSCULAR | Status: AC
  Filled 2017-05-31: qty 2

## 2017-05-31 MED ORDER — MIDAZOLAM HCL 2 MG/2ML IJ SOLN
INTRAMUSCULAR | Status: DC | PRN
  Administered 2017-05-31: 1 mg via INTRAVENOUS

## 2017-05-31 MED ORDER — SODIUM CHLORIDE 0.9% FLUSH: 3.0000 mL | Freq: Two times a day (BID) | INTRAVENOUS | Status: DC

## 2017-05-31 MED ORDER — SODIUM CHLORIDE 0.9 % IV SOLN: 250.0000 mL | INTRAVENOUS | Status: DC | PRN

## 2017-05-31 MED ORDER — IOPAMIDOL (ISOVUE-370) INJECTION 76%
INTRAVENOUS | Status: AC
  Filled 2017-05-31: qty 100

## 2017-05-31 MED ORDER — LIDOCAINE HCL 1 % IJ SOLN
INTRAMUSCULAR | Status: AC
  Filled 2017-05-31: qty 20

## 2017-05-31 MED ORDER — LIDOCAINE HCL (PF) 1 % IJ SOLN
INTRAMUSCULAR | Status: DC | PRN
  Administered 2017-05-31: 2 mL via SUBCUTANEOUS

## 2017-05-31 MED ORDER — FENTANYL CITRATE (PF) 100 MCG/2ML IJ SOLN
INTRAMUSCULAR | Status: AC
  Filled 2017-05-31: qty 2

## 2017-05-31 MED ORDER — SODIUM CHLORIDE 0.9% FLUSH: 3.0000 mL | INTRAVENOUS | Status: DC | PRN

## 2017-05-31 MED ORDER — HEPARIN SODIUM (PORCINE) 1000 UNIT/ML IJ SOLN
INTRAMUSCULAR | Status: DC | PRN
  Administered 2017-05-31: 4500 [IU] via INTRAVENOUS

## 2017-05-31 MED ORDER — HEPARIN (PORCINE) IN NACL 2-0.9 UNIT/ML-% IJ SOLN
INTRAMUSCULAR | Status: AC | PRN
  Administered 2017-05-31 (×2): 500 mL

## 2017-05-31 MED ORDER — HEPARIN (PORCINE) IN NACL 2-0.9 UNIT/ML-% IJ SOLN
INTRAMUSCULAR | Status: AC
  Filled 2017-05-31: qty 1000

## 2017-05-31 MED ORDER — FENTANYL CITRATE (PF) 100 MCG/2ML IJ SOLN
INTRAMUSCULAR | Status: DC | PRN
  Administered 2017-05-31: 25 ug via INTRAVENOUS

## 2017-05-31 MED ORDER — HEPARIN SODIUM (PORCINE) 1000 UNIT/ML IJ SOLN
INTRAMUSCULAR | Status: AC
  Filled 2017-05-31: qty 1

## 2017-05-31 MED ORDER — SODIUM CHLORIDE 0.9 % IV SOLN
INTRAVENOUS | Status: AC
  Administered 2017-05-31: 125 mL/h via INTRAVENOUS

## 2017-05-31 MED ORDER — HEPARIN (PORCINE) IN NACL 2-0.9 UNIT/ML-% IJ SOLN
INTRAMUSCULAR | Status: DC | PRN
  Administered 2017-05-31: 10 mL via INTRA_ARTERIAL

## 2017-05-31 MED ORDER — VERAPAMIL HCL 2.5 MG/ML IV SOLN
INTRAVENOUS | Status: AC
  Filled 2017-05-31: qty 2

## 2017-05-31 SURGICAL SUPPLY — 9 items
CATH 5FR JL3.5 JR4 ANG PIG MP (CATHETERS) ×2 IMPLANT
DEVICE RAD COMP TR BAND LRG (VASCULAR PRODUCTS) ×2 IMPLANT
GLIDESHEATH SLEND SS 6F .021 (SHEATH) ×2 IMPLANT
GUIDEWIRE INQWIRE 1.5J.035X260 (WIRE) ×1 IMPLANT
INQWIRE 1.5J .035X260CM (WIRE) ×2
KIT HEART LEFT (KITS) ×2 IMPLANT
PACK CARDIAC CATHETERIZATION (CUSTOM PROCEDURE TRAY) ×2 IMPLANT
TRANSDUCER W/STOPCOCK (MISCELLANEOUS) ×2 IMPLANT
TUBING CIL FLEX 10 FLL-RA (TUBING) ×2 IMPLANT

## 2017-05-31 NOTE — Progress Notes (Signed)
Pt was asking to make him sleep tonight. MD was notified and ordered Ambien 5mg  PO one time. Will continue to monitor pt.

## 2017-05-31 NOTE — Telephone Encounter (Signed)
   Primary Cardiologist:Mark Marlou Porch, MD  Patient underwent stress testing and subsequently, Cardiac Catheterization.   Please address perioperative risk recommendations at follow up Jun 15, 2017.  Richardson Dopp, PA-C  05/31/2017, 2:12 PM

## 2017-05-31 NOTE — H&P (View-Only) (Signed)
Progress Note  Patient Name: Lucas Hardy Date of Encounter: 05/31/2017  Primary Cardiologist: No primary care provider on file.   Subjective   No chest pain.  Awaiting cath.  Inpatient Medications    Scheduled Meds: . amLODipine  5 mg Oral Daily  . aspirin EC  81 mg Oral Daily  . FLUoxetine  20 mg Oral Daily  . heparin  5,000 Units Subcutaneous Q8H  . metoprolol succinate  25 mg Oral Daily  . rosuvastatin  20 mg Oral q1800  . sodium chloride flush  3 mL Intravenous Q12H  . tamsulosin  0.4 mg Oral Daily   Continuous Infusions: . sodium chloride    . sodium chloride 75 mL/hr at 05/31/17 0147   PRN Meds: sodium chloride, acetaminophen, nitroGLYCERIN, ondansetron (ZOFRAN) IV, sodium chloride flush   Vital Signs    Vitals:   05/31/17 0411 05/31/17 0849 05/31/17 0850 05/31/17 1158  BP: (!) 177/74 (!) 162/71  (!) 160/79  Pulse: (!) 53  (!) 51 (!) 53  Resp: 18   16  Temp: 97.9 F (36.6 C)   98.6 F (37 C)  TempSrc: Oral   Oral  SpO2: 98%   100%  Weight: 190 lb 3.2 oz (86.3 kg)     Height:        Intake/Output Summary (Last 24 hours) at 05/31/2017 1241 Last data filed at 05/31/2017 0600 Gross per 24 hour  Intake 1260 ml  Output -  Net 1260 ml   Filed Weights   05/30/17 1454 05/31/17 0411  Weight: 190 lb 12.8 oz (86.5 kg) 190 lb 3.2 oz (86.3 kg)    Telemetry    NSR - Personally Reviewed  ECG      Physical Exam   GEN: No acute distress.   Neck: No JVD Cardiac: RRR, no murmurs, rubs, or gallops.  Respiratory: Clear to auscultation bilaterally. GI: Soft, nontender, non-distended  MS: No edema; No deformity. Neuro:  Nonfocal  Psych: Normal affect   Labs    Chemistry Recent Labs  Lab 05/26/17 1146 05/30/17 1602 05/31/17 0500  NA 141 138 141  K 4.9 4.3 4.0  CL 103 103 110  CO2 23 23 21*  GLUCOSE 98 98 96  BUN 32* 36* 37*  CREATININE 2.48* 2.33* 2.24*  CALCIUM 9.8 8.8* 8.8*  GFRNONAA 25* 27* 28*  GFRAA 29* 31* 32*  ANIONGAP  --  12 10      Hematology Recent Labs  Lab 05/26/17 1146 05/30/17 1602 05/31/17 0500  WBC 7.2 7.5 6.9  RBC 3.55* 3.19* 3.21*  HGB 10.9* 9.9* 9.7*  HCT 31.2* 29.0* 28.9*  MCV 88 90.9 90.0  MCH 30.7 31.0 30.2  MCHC 34.9 34.1 33.6  RDW 13.7 13.4 13.1  PLT 232 203 200    Cardiac EnzymesNo results for input(s): TROPONINI in the last 168 hours. No results for input(s): TROPIPOC in the last 168 hours.   BNPNo results for input(s): BNP, PROBNP in the last 168 hours.   DDimer No results for input(s): DDIMER in the last 168 hours.   Radiology    No results found.  Cardiac Studies     Patient Profile     72 y.o. male with mitral regurgitation and coronary calcium, CRI  Assessment & Plan    1) Being hydrated.  Cath today.    2) CRI: Cr slightly better today.  3) Mitral regurg: No CHF  For questions or updates, please contact [place] Please consult [email or web address] for contact info  under Cardiology/STEMI.      Signed, Larae Grooms, MD  05/31/2017, 12:41 PM

## 2017-05-31 NOTE — Progress Notes (Signed)
Received post heart cath, Right radial TR band with 13cc air per report, level 0. Will monitor accordingly.

## 2017-05-31 NOTE — Progress Notes (Signed)
Discharge to home , wife at bedside. Discharge instructions and follow up appointments, care post radial cath discussed with wife. Right radial site, dressing CDI, level 0.

## 2017-05-31 NOTE — Progress Notes (Signed)
Progress Note  Patient Name: Lucas Hardy Date of Encounter: 05/31/2017  Primary Cardiologist: No primary care provider on file.   Subjective   No chest pain.  Awaiting cath.  Inpatient Medications    Scheduled Meds: . amLODipine  5 mg Oral Daily  . aspirin EC  81 mg Oral Daily  . FLUoxetine  20 mg Oral Daily  . heparin  5,000 Units Subcutaneous Q8H  . metoprolol succinate  25 mg Oral Daily  . rosuvastatin  20 mg Oral q1800  . sodium chloride flush  3 mL Intravenous Q12H  . tamsulosin  0.4 mg Oral Daily   Continuous Infusions: . sodium chloride    . sodium chloride 75 mL/hr at 05/31/17 0147   PRN Meds: sodium chloride, acetaminophen, nitroGLYCERIN, ondansetron (ZOFRAN) IV, sodium chloride flush   Vital Signs    Vitals:   05/31/17 0411 05/31/17 0849 05/31/17 0850 05/31/17 1158  BP: (!) 177/74 (!) 162/71  (!) 160/79  Pulse: (!) 53  (!) 51 (!) 53  Resp: 18   16  Temp: 97.9 F (36.6 C)   98.6 F (37 C)  TempSrc: Oral   Oral  SpO2: 98%   100%  Weight: 190 lb 3.2 oz (86.3 kg)     Height:        Intake/Output Summary (Last 24 hours) at 05/31/2017 1241 Last data filed at 05/31/2017 0600 Gross per 24 hour  Intake 1260 ml  Output -  Net 1260 ml   Filed Weights   05/30/17 1454 05/31/17 0411  Weight: 190 lb 12.8 oz (86.5 kg) 190 lb 3.2 oz (86.3 kg)    Telemetry    NSR - Personally Reviewed  ECG      Physical Exam   GEN: No acute distress.   Neck: No JVD Cardiac: RRR, no murmurs, rubs, or gallops.  Respiratory: Clear to auscultation bilaterally. GI: Soft, nontender, non-distended  MS: No edema; No deformity. Neuro:  Nonfocal  Psych: Normal affect   Labs    Chemistry Recent Labs  Lab 05/26/17 1146 05/30/17 1602 05/31/17 0500  NA 141 138 141  K 4.9 4.3 4.0  CL 103 103 110  CO2 23 23 21*  GLUCOSE 98 98 96  BUN 32* 36* 37*  CREATININE 2.48* 2.33* 2.24*  CALCIUM 9.8 8.8* 8.8*  GFRNONAA 25* 27* 28*  GFRAA 29* 31* 32*  ANIONGAP  --  12 10      Hematology Recent Labs  Lab 05/26/17 1146 05/30/17 1602 05/31/17 0500  WBC 7.2 7.5 6.9  RBC 3.55* 3.19* 3.21*  HGB 10.9* 9.9* 9.7*  HCT 31.2* 29.0* 28.9*  MCV 88 90.9 90.0  MCH 30.7 31.0 30.2  MCHC 34.9 34.1 33.6  RDW 13.7 13.4 13.1  PLT 232 203 200    Cardiac EnzymesNo results for input(s): TROPONINI in the last 168 hours. No results for input(s): TROPIPOC in the last 168 hours.   BNPNo results for input(s): BNP, PROBNP in the last 168 hours.   DDimer No results for input(s): DDIMER in the last 168 hours.   Radiology    No results found.  Cardiac Studies     Patient Profile     72 y.o. male with mitral regurgitation and coronary calcium, CRI  Assessment & Plan    1) Being hydrated.  Cath today.    2) CRI: Cr slightly better today.  3) Mitral regurg: No CHF  For questions or updates, please contact [place] Please consult [email or web address] for contact info  under Cardiology/STEMI.      Signed, Larae Grooms, MD  05/31/2017, 12:41 PM

## 2017-05-31 NOTE — Interval H&P Note (Signed)
History and Physical Interval Note:  05/31/2017 1:28 PM  Lucas Hardy  has presented today for surgery, with the diagnosis of Abnormal stress test  The various methods of treatment have been discussed with the patient and family. After consideration of risks, benefits and other options for treatment, the patient has consented to  Procedure(s): LEFT HEART CATH AND CORONARY ANGIOGRAPHY (N/A) as a surgical intervention .  The patient's history has been reviewed, patient examined, no change in status, stable for surgery.  I have reviewed the patient's chart and labs.  Questions were answered to the patient's satisfaction.    Cath Lab Visit (complete for each Cath Lab visit)  Clinical Evaluation Leading to the Procedure:   ACS: No.  Non-ACS:    Anginal Classification: CCS II  Anti-ischemic medical therapy: Maximal Therapy (2 or more classes of medications)  Non-Invasive Test Results: Intermediate-risk stress test findings: cardiac mortality 1-3%/year  Prior CABG: No previous CABG       Collier [facility]ina [facility] 05/31/2017 1:28 PM

## 2017-05-31 NOTE — Discharge Summary (Addendum)
Discharge Summary    Patient ID: Lucas Hardy,  MRN: [id number], DOB/AGE: January 12, 1946 72 y.o.  Admit date: 05/30/2017 Discharge date: 06/01/2017  Primary Care Provider: Josetta Huddle Primary Cardiologist: Dr. Marlou Porch   Discharge Diagnoses    Active Problems:   Chest pain   Allergies Allergies  Allergen Reactions  . Effexor [Venlafaxine]     Anxiety     Diagnostic Studies/Procedures    Cath: 05/31/17  Conclusion     Dist RCA lesion is 25% stenosed.  Post Atrio lesion is 30% stenosed.  LV end diastolic pressure is normal.   1. Minor nonobstructive CAD 2. Low LVEDP  Plan: medical management. Will hydrate for 4 hours and plan DC this evening.   _____________   History of Present Illness     72 yo male with PMH of HTN, HL, CKD III, aortic atherosclerosis and GERD who presented to the office on 05/26/17 with symptoms of fatigue and indigestion. Sometimes when resting, he would awaken himself gasping but this is quite rare.  He did experience some dyspnea on exertion but is categorized as mild.  He will occasionally have dizziness, when walking the dog for instance he came in one time holding onto the walls went to the bathroom and threw up, vertiginous-like symptoms.  He also smoked for about 10 years. Given his ongoing symptoms he was sent for a ETT which was abnormal. Ultimately planned for cardiac cath. Cr was noted at 2.5 and he was admitted for pre-hydration.  Hospital Course     Underwent cardiac cath noted above with mild nonobstructive CAD and low LVEDP. No complications noted post cath. He was hydrated for 4 hours post cath. Of noted Cr 2.2 prior to cath and only 30cc of contrast used. Radial cath site stable. Instructions/Precautions given prior to discharge.   Jenne Pane Clarin was seen by Dr. Irish Lack and determined stable for discharge home. Follow up in the office has been arranged. Medications are listed below.  _____________  Discharge Vitals Blood  pressure 134/69, pulse (!) 51, temperature 98.6 F (37 C), temperature source Oral, resp. rate 18, height 5\' 10"  (1.778 m), weight 190 lb 3.2 oz (86.3 kg), SpO2 100 %.  Filed Weights   05/30/17 1454 05/31/17 0411  Weight: 190 lb 12.8 oz (86.5 kg) 190 lb 3.2 oz (86.3 kg)    Labs & Radiologic Studies    CBC Recent Labs    05/30/17 1602 05/31/17 0500  WBC 7.5 6.9  HGB 9.9* 9.7*  HCT 29.0* 28.9*  MCV 90.9 90.0  PLT 203 976   Basic Metabolic Panel Recent Labs    05/30/17 1602 05/31/17 0500  NA 138 141  K 4.3 4.0  CL 103 110  CO2 23 21*  GLUCOSE 98 96  BUN 36* 37*  CREATININE 2.33* 2.24*  CALCIUM 8.8* 8.8*   Liver Function Tests No results for input(s): AST, ALT, ALKPHOS, BILITOT, PROT, ALBUMIN in the last 72 hours. No results for input(s): LIPASE, AMYLASE in the last 72 hours. Cardiac Enzymes No results for input(s): CKTOTAL, CKMB, CKMBINDEX, TROPONINI in the last 72 hours. BNP Invalid input(s): POCBNP D-Dimer No results for input(s): DDIMER in the last 72 hours. Hemoglobin A1C No results for input(s): HGBA1C in the last 72 hours. Fasting Lipid Panel No results for input(s): CHOL, HDL, LDLCALC, TRIG, CHOLHDL, LDLDIRECT in the last 72 hours. Thyroid Function Tests No results for input(s): TSH, T4TOTAL, T3FREE, THYROIDAB in the last 72 hours.  Invalid input(s): FREET3 _____________  No results found. Disposition   Pt is being discharged home today in good condition.  Follow-up Plans & Appointments    Follow-up Information    Isaiah Serge, NP Follow up on 06/15/2017.   Specialties:  Cardiology, Radiology Why:  at 3:30pm for your follow up appt.  Contact information: [address] [place] [address] [phone]        Josetta Huddle, MD. Go on 06/07/2017.   Specialty:  Internal Medicine Why:  @10 :45am Contact information: [address] [place] [address] [phone]          Discharge Instructions    Call MD  for:  redness, tenderness, or signs of infection (pain, swelling, redness, odor or green/yellow discharge around incision site)   Complete by:  As directed    Diet - low sodium heart healthy   Complete by:  As directed    Discharge instructions   Complete by:  As directed    Radial Site Care Refer to this sheet in the next few weeks. These instructions provide you with information on caring for yourself after your procedure. Your caregiver may also give you more specific instructions. Your treatment has been planned according to current medical practices, but problems sometimes occur. Call your caregiver if you have any problems or questions after your procedure. HOME CARE INSTRUCTIONS You may shower the day after the procedure.Remove the bandage (dressing) and gently wash the site with plain soap and water.Gently pat the site dry.  Do not apply powder or lotion to the site.  Do not submerge the affected site in water for 3 to 5 days.  Inspect the site at least twice daily.  Do not flex or bend the affected arm for 24 hours.  No lifting over 5 pounds (2.3 kg) for 5 days after your procedure.  Do not drive home if you are discharged the same day of the procedure. Have someone else drive you.  You may drive 24 hours after the procedure unless otherwise instructed by your caregiver.  What to expect: Any bruising will usually fade within 1 to 2 weeks.  Blood that collects in the tissue (hematoma) may be painful to the touch. It should usually decrease in size and tenderness within 1 to 2 weeks.  SEEK IMMEDIATE MEDICAL CARE IF: You have unusual pain at the radial site.  You have redness, warmth, swelling, or pain at the radial site.  You have drainage (other than a small amount of blood on the dressing).  You have chills.  You have a fever or persistent symptoms for more than 72 hours.  You have a fever and your symptoms suddenly get worse.  Your arm becomes pale, cool, tingly, or numb.  You  have heavy bleeding from the site. Hold pressure on the site.   Increase activity slowly   Complete by:  As directed       Discharge Medications     Medication List    TAKE these medications   amLODipine 5 MG tablet Commonly known as:  NORVASC Take 5 mg by mouth daily.   aspirin 81 MG tablet Take 81 mg by mouth daily.   Cholecalciferol 2000 units Caps Take 2,000 Units by mouth daily.   Coenzyme Q10 10 MG capsule Take 10 mg by mouth daily.   FLUoxetine 20 MG capsule Commonly known as:  PROZAC Take 20 mg by mouth daily.   metoprolol succinate 25 MG 24 hr tablet Commonly known as:  TOPROL-XL Take 1 tablet (25 mg total) by mouth daily.   multivitamin with minerals Tabs tablet Take 1 tablet by mouth daily.   rosuvastatin 20 MG tablet Commonly known as:  CRESTOR Take 20 mg by mouth daily.   sildenafil 100 MG tablet Commonly known as:  VIAGRA Take 100 mg by mouth daily as needed for erectile dysfunction.   tamsulosin 0.4 MG Caps capsule Commonly known as:  FLOMAX Take 0.4 mg by mouth daily.       Outstanding Labs/Studies   N/a   Duration of Discharge Encounter   Greater than 30 minutes including physician time.  Signed, Reino Bellis NP-C 06/01/2017, 7:28 AM   I have examined the patient and reviewed assessment and plan and discussed with patient.  Agree with above as stated.  S/p cath.  Minimal CAD. Low amount of contrast used in the setting of renal insufficiency.  Patient hydrated post procedure.  F/u in the office.   Larae Grooms

## 2017-05-31 NOTE — Care Management Obs Status (Signed)
[facility] NOTIFICATION   Patient Details  Name: Lucas Hardy MRN: [id number] Date of Birth: 1946/02/09   Medicare Observation Status Notification Given:  Yes    Carles Collet, RN 05/31/2017, 9:16 AM

## 2017-05-31 NOTE — Progress Notes (Signed)
Right radial Cath site, level 0.

## 2017-06-01 MED FILL — Lidocaine HCl Local Inj 1%: INTRAMUSCULAR | Qty: 20 | Status: AC

## 2017-06-01 MED FILL — Heparin Sodium (Porcine) 2 Unit/ML in Sodium Chloride 0.9%: INTRAMUSCULAR | Qty: 1000 | Status: AC

## 2017-06-01 NOTE — Telephone Encounter (Signed)
Reassuring cath.  Lucas Furbish, MD

## 2017-06-02 ENCOUNTER — Other Ambulatory Visit: Payer: Self-pay

## 2017-06-02 NOTE — Patient Outreach (Signed)
[facility]) Care Management  06/02/2017  Lucas Hardy 1945-04-26 [id number]   Referral received. No outreach warranted at this time. Transition of Care  will be completed by primary care provider office who will refer to [facility] care management if needed.  Plan: RN CM will close case and notify care management assistant of case status.  Jone Baseman, RN, MSN [facility] Care Management Care Management Coordinator Direct Line [phone] Toll Free: [phone]  Fax: [phone]

## 2017-06-07 ENCOUNTER — Other Ambulatory Visit: Payer: Self-pay | Admitting: Internal Medicine

## 2017-06-07 DIAGNOSIS — N289 Disorder of kidney and ureter, unspecified: Secondary | ICD-10-CM | POA: Diagnosis not present

## 2017-06-07 DIAGNOSIS — D649 Anemia, unspecified: Secondary | ICD-10-CM | POA: Diagnosis not present

## 2017-06-07 DIAGNOSIS — R1013 Epigastric pain: Secondary | ICD-10-CM | POA: Diagnosis not present

## 2017-06-07 DIAGNOSIS — N183 Chronic kidney disease, stage 3 (moderate): Secondary | ICD-10-CM | POA: Diagnosis not present

## 2017-06-09 ENCOUNTER — Ambulatory Visit
Admission: RE | Admit: 2017-06-09 | Discharge: 2017-06-09 | Disposition: A | Discharge status: Home / Self Care | Payer: PPO | Source: Ambulatory Visit | Attending: Internal Medicine | Admitting: Internal Medicine

## 2017-06-09 DIAGNOSIS — N1330 Unspecified hydronephrosis: Secondary | ICD-10-CM | POA: Diagnosis not present

## 2017-06-09 DIAGNOSIS — N289 Disorder of kidney and ureter, unspecified: Secondary | ICD-10-CM

## 2017-06-10 DIAGNOSIS — R338 Other retention of urine: Secondary | ICD-10-CM | POA: Diagnosis not present

## 2017-06-10 DIAGNOSIS — N401 Enlarged prostate with lower urinary tract symptoms: Secondary | ICD-10-CM | POA: Diagnosis not present

## 2017-06-10 DIAGNOSIS — N130 Hydronephrosis with ureteropelvic junction obstruction: Secondary | ICD-10-CM | POA: Diagnosis not present

## 2017-06-10 DIAGNOSIS — N138 Other obstructive and reflux uropathy: Secondary | ICD-10-CM | POA: Diagnosis not present

## 2017-06-10 DIAGNOSIS — N170 Acute kidney failure with tubular necrosis: Secondary | ICD-10-CM | POA: Diagnosis not present

## 2017-06-15 ENCOUNTER — Encounter: Payer: Self-pay | Admitting: Physician Assistant

## 2017-06-15 ENCOUNTER — Ambulatory Visit (INDEPENDENT_AMBULATORY_CARE_PROVIDER_SITE_OTHER): Payer: PPO | Admitting: Physician Assistant

## 2017-06-15 VITALS — BP 120/62 | HR 64 | Ht 70.0 in | Wt 187.0 lb

## 2017-06-15 DIAGNOSIS — R42 Dizziness and giddiness: Secondary | ICD-10-CM

## 2017-06-15 DIAGNOSIS — I10 Essential (primary) hypertension: Secondary | ICD-10-CM

## 2017-06-15 DIAGNOSIS — E782 Mixed hyperlipidemia: Secondary | ICD-10-CM

## 2017-06-15 DIAGNOSIS — I2510 Atherosclerotic heart disease of native coronary artery without angina pectoris: Secondary | ICD-10-CM

## 2017-06-15 MED ORDER — METOPROLOL SUCCINATE ER 25 MG PO TB24: 12.5000 mg | ORAL_TABLET | Freq: Every day | ORAL | 5 refills | Status: DC

## 2017-06-15 NOTE — Progress Notes (Addendum)
Cardiology Office Note:    Date:  06/15/2017   ID:  Lucas Hardy, DOB [birth year], MRN [id number]  PCP:  Josetta Huddle, MD  Cardiologist:  Candee Furbish, MD   Referring MD: Josetta Huddle, MD   Chief Complaint  Patient presents with  . Hospitalization Follow-up    nonobstructive CAD    History of Present Illness:    Lucas Hardy is a 72 y.o. male with a hx of HTN, HLD, CKD stage III, aortic atherosclerosis, and GERD. He presented to the office on 05/26/17 with symptoms of fatigue and indigestion. He was sent for an ETT, which was abnormal. Heart catheterization was planned. Given his CKD, he was admitted for hydration prior to procedure.   Lucas Hardy was recently hospitalized 05/30/17-06/01/17 for IV hydration prior to planned heart catheterization. LHC showed mild nonobstructive disease and normal LVEDP. There were no complications. He was hydrated following cath and discharged. He was discharged on norvasc 5 mg, torpol 25 mg, and ASA 81 mg. He also takes crestor 20 mg.   Since his heart catheterization, he has done well. He describes some urinary dysfunction and is following with urology. Of note, he was found to be retaining 2.5 L of fluid in his bladder following hydration after cath and is now with chronic foley. He describes feeling lightheaded and dizzy that are not new since the cath.  He denies chest pain, SOB, orthopnea, lower extremity swelling, and syncope.  Orthostatic vitals were checked:  126/70 lying 112/70 sitting 110/62 standing - no symptoms  Of note, echo obtained 03/09/17 following a diastolic murmur on exam showed moderate mitral regurgitation noted. Dr. Marlou Porch advised him to have a follow up echo in one year.    Past Medical History:  Diagnosis Date  . Anxiety   . Arthritis of hand, degenerative   . Bursitis of elbow   . Cerumen impaction   . CKD (chronic kidney disease), stage IV ([place])    Lucas Hardy 05/30/2017  . Depression   . Diastolic murmur   .  Dysplastic nevus of trunk   . ED (erectile dysfunction)   . Enlarged prostate   . 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 Fatigue   . GERD (gastroesophageal reflux disease)   . Hemorrhoids   . Herpes simplex with complication   . Hypercholesteremia   . Hyperlipidemia   . Hypertension   . Insomnia   . Leaky heart valve   . Lightheaded   . Lung nodule   . Melanoma ([place])    LLE  . Memory loss   . Myalgia   . Myositis   . On long term drug therapy   . Pain in joint, hand   . Paresthesias   . Pneumonia    "when I was a child" (05/30/2017)  . Prostatism   . Radiculopathy of lumbar region   . Rectal bleeding   . Restless legs syndrome (RLS)   . Seborrheic keratoses, inflamed   . Sinusitis, acute   . Vitamin D deficiency     Past Surgical History:  Procedure Laterality Date  . FINGER SURGERY Left    "cut end off my pointer finger; had it reattached"  . INGUINAL HERNIA REPAIR Left ?1998  . LEFT HEART CATH AND CORONARY ANGIOGRAPHY N/A 05/31/2017   Procedure: LEFT HEART CATH AND CORONARY ANGIOGRAPHY;  Surgeon: Martinique, Peter M, MD;  Location: [facility];  Service: Cardiovascular;  Laterality: N/A;  . MELANOMA EXCISION  LLE  . TONSILLECTOMY  ~ 1963  . TRANSPHENOIDAL PITUITARY RESECTION  1997    Current Medications: Current Meds  Medication Sig  . amLODipine (NORVASC) 5 MG tablet Take 5 mg by mouth daily.  Marland Kitchen aspirin 81 MG tablet Take 81 mg by mouth daily.  . Cholecalciferol 2000 units CAPS Take 2,000 Units by mouth daily.  . Coenzyme Q10 10 MG capsule Take 10 mg by mouth daily.  Marland Kitchen FLUoxetine (PROZAC) 20 MG capsule Take 20 mg by mouth daily.  . Multiple Vitamin (MULTIVITAMIN WITH MINERALS) TABS tablet Take 1 tablet by mouth daily.  . rosuvastatin (CRESTOR) 20 MG tablet Take 20 mg by mouth daily.  . sildenafil (VIAGRA) 100 MG tablet Take 100 mg by mouth daily as needed for erectile dysfunction.  . tamsulosin (FLOMAX) 0.4 MG CAPS  capsule Take 0.4 mg by mouth daily.   . [DISCONTINUED] metoprolol succinate (TOPROL-XL) 25 MG 24 hr tablet Take 1 tablet (25 mg total) by mouth daily.     Allergies:   Effexor [venlafaxine]   Social History   Socioeconomic History  . Marital status: Married    Spouse name: None  . Number of children: None  . Years of education: None  . Highest education level: None  Social Needs  . Financial resource strain: None  . Food insecurity - worry: None  . Food insecurity - inability: None  . Transportation needs - medical: None  . Transportation needs - non-medical: None  Occupational History  . None  Tobacco Use  . Smoking status: Former Smoker    Packs/day: 1.00    Years: 15.00    Pack years: 15.00    Types: Cigarettes    Last attempt to quit: 04/11/1978    Years since quitting: 39.2  . Smokeless tobacco: Former Systems developer    Types: Chew  Substance and Sexual Activity  . Alcohol use: No    Frequency: Never  . Drug use: No  . Sexual activity: Not Currently  Other Topics Concern  . None  Social History Narrative  . None     Family History: The patient's family history includes Hypertension in his father.  ROS:   Please see the history of present illness.    All other systems reviewed and are negative.  EKGs/Labs/Other Studies Reviewed:    The following studies were reviewed today:  Echo 03/09/17  LHC 06/28/17:  Dist RCA lesion is 25% stenosed.  Post Atrio lesion is 30% stenosed.  LV end diastolic pressure is normal.   1. Minor nonobstructive CAD 2. Low LVEDP  Plan: medical management. Will hydrate for 4 hours and plan DC this evening.    EKG:  EKG is not ordered today.    Recent Labs: 05/31/2017: BUN 37; Creatinine, Ser 2.24; Hemoglobin 9.7; Platelets 200; Potassium 4.0; Sodium 141  Recent Lipid Panel No results found for: CHOL, TRIG, HDL, CHOLHDL, VLDL, LDLCALC, LDLDIRECT  Physical Exam:    VS:  BP 120/62   Pulse 64   Ht 5\' 10"  (1.778 m)   Wt 187 lb  (84.8 kg)   BMI 26.83 kg/m     Wt Readings from Last 3 Encounters:  06/15/17 187 lb (84.8 kg)  05/31/17 190 lb 3.2 oz (86.3 kg)  05/26/17 189 lb 1.9 oz (85.8 kg)     GEN: Well nourished, well developed in no acute distress HEENT: Normal NECK: No JVD; No carotid bruits LYMPHATICS: No lymphadenopathy CARDIAC: RRR, + murmurs, no rubs, gallops RESPIRATORY:  Clear to auscultation without  rales, wheezing or rhonchi  ABDOMEN: Soft, non-tender, non-distended MUSCULOSKELETAL:  No edema; No deformity  SKIN: Warm and dry, right radial cath site C/D/I without bleeding or signs of infection NEUROLOGIC:  Alert and oriented x 3 PSYCHIATRIC:  Normal affect   ASSESSMENT:    1. Coronary artery disease involving native coronary artery of native heart without angina pectoris   2. Mixed hyperlipidemia   3. Essential hypertension   4. Lightheadedness    PLAN:    In order of problems listed above:  Coronary artery disease involving native coronary artery of native heart without angina pectoris No chest pain or shortness of breath. Right radial site C/D/I.  Mixed hyperlipidemia Continue on crestor 20 mg. Lipids are followed yearly by his PCP. Given his non-osbstructive CAD, his new LDL goal is less than 70. Consider increasing crestor to 40 mg at next physical if LDL not at goal.  Essential hypertension BP has been well-controlled. Continue norvasc and toprol (as below).  Lightheadedness Orthostatic vitals negative for hypotension. I suggested he could decrease his toprol to 12.5 mg and to take it at night. He will keep a BP and HR log daily. If he has no improvement in symptoms, he should return to 25 mg toprol, as this is a good medication for his heart. He expressed understanding of the plan.   Given his recent heart cath without intervention, he is cleared for spinal surgery from a cardiology perspective.  He will follow up with Dr. Marlou Porch in about 1 year with an echocardiogram. Sooner  PRN.   Medication Adjustments/Labs and Tests Ordered: Current medicines are reviewed at length with the patient today.  Concerns regarding medicines are outlined above.  No orders of the defined types were placed in this encounter.  Meds ordered this encounter  Medications  . metoprolol succinate (TOPROL XL) 25 MG 24 hr tablet    Sig: Take 0.5 tablets (12.5 mg total) by mouth daily.    Dispense:  15 tablet    Refill:  5    Signed, Ledora Bottcher, [place]  06/15/2017 4:57 PM    [facility]

## 2017-06-15 NOTE — Patient Instructions (Signed)
Medication Instructions: Your physician has recommended you make the following change in your medication:  -1) TAKE 0.5 tablet 12.5 mg of Metoprolol for 1 week if blood pressure is unchanged you may go back to 1 full tablet   Labwork: None Ordered  Procedures/Testing: None Ordered  Follow-Up: Your physician wants you to follow-up in: 1 year with Dr. Marlou Porch. You will receive a reminder letter in the mail two months in advance. If you don't receive a letter, please call our office to schedule the follow-up appointment.     If you need a refill on your cardiac medications before your next appointment, please call your pharmacy.

## 2017-06-16 DIAGNOSIS — N130 Hydronephrosis with ureteropelvic junction obstruction: Secondary | ICD-10-CM | POA: Diagnosis not present

## 2017-06-16 DIAGNOSIS — N131 Hydronephrosis with ureteral stricture, not elsewhere classified: Secondary | ICD-10-CM | POA: Diagnosis not present

## 2017-06-16 NOTE — Telephone Encounter (Signed)
   Primary Cardiologist: Candee Furbish, MD  Chart reviewed as part of pre-operative protocol coverage. Given past medical history and time since last visit, based on ACC/AHA guidelines, LENNEX PIETILA would be at acceptable risk for the planned procedure without further cardiovascular testing.  Cardiac cath was done with non obstructive disease.    I will route this recommendation to the requesting party via Epic fax function and remove from pre-op pool.  Please call with questions.  Cecilie Kicks, NP 06/16/2017, 8:26 AM

## 2017-06-20 DIAGNOSIS — N1330 Unspecified hydronephrosis: Secondary | ICD-10-CM | POA: Diagnosis not present

## 2017-06-20 DIAGNOSIS — N3000 Acute cystitis without hematuria: Secondary | ICD-10-CM | POA: Diagnosis not present

## 2017-06-20 DIAGNOSIS — N10 Acute tubulo-interstitial nephritis: Secondary | ICD-10-CM | POA: Diagnosis not present

## 2017-06-20 DIAGNOSIS — R1084 Generalized abdominal pain: Secondary | ICD-10-CM | POA: Diagnosis not present

## 2017-06-21 DIAGNOSIS — A419 Sepsis, unspecified organism: Secondary | ICD-10-CM | POA: Diagnosis not present

## 2017-06-21 DIAGNOSIS — R339 Retention of urine, unspecified: Secondary | ICD-10-CM | POA: Diagnosis not present

## 2017-06-21 DIAGNOSIS — N10 Acute tubulo-interstitial nephritis: Secondary | ICD-10-CM | POA: Diagnosis not present

## 2017-06-21 DIAGNOSIS — B349 Viral infection, unspecified: Secondary | ICD-10-CM | POA: Diagnosis not present

## 2017-06-22 ENCOUNTER — Emergency Department (HOSPITAL_COMMUNITY): Payer: PPO

## 2017-06-22 ENCOUNTER — Encounter (HOSPITAL_COMMUNITY)
Admission: EM | Disposition: A | Discharge status: Home / Self Care | Payer: Self-pay | Source: Home / Self Care | Attending: Urology

## 2017-06-22 ENCOUNTER — Inpatient Hospital Stay (HOSPITAL_COMMUNITY)
Admission: EM | Admit: 2017-06-22 | Discharge: 2017-06-24 | DRG: 660 | Disposition: A | Discharge status: Home / Self Care | Payer: PPO | Attending: Urology | Admitting: Urology

## 2017-06-22 ENCOUNTER — Emergency Department (HOSPITAL_COMMUNITY): Payer: PPO | Admitting: Anesthesiology

## 2017-06-22 ENCOUNTER — Encounter (HOSPITAL_COMMUNITY): Payer: Self-pay | Admitting: Emergency Medicine

## 2017-06-22 ENCOUNTER — Other Ambulatory Visit: Payer: Self-pay

## 2017-06-22 DIAGNOSIS — R402252 Coma scale, best verbal response, oriented, at arrival to emergency department: Secondary | ICD-10-CM | POA: Diagnosis present

## 2017-06-22 DIAGNOSIS — N390 Urinary tract infection, site not specified: Secondary | ICD-10-CM | POA: Diagnosis not present

## 2017-06-22 DIAGNOSIS — N184 Chronic kidney disease, stage 4 (severe): Secondary | ICD-10-CM | POA: Diagnosis present

## 2017-06-22 DIAGNOSIS — E785 Hyperlipidemia, unspecified: Secondary | ICD-10-CM | POA: Diagnosis not present

## 2017-06-22 DIAGNOSIS — R339 Retention of urine, unspecified: Secondary | ICD-10-CM | POA: Diagnosis present

## 2017-06-22 DIAGNOSIS — R402362 Coma scale, best motor response, obeys commands, at arrival to emergency department: Secondary | ICD-10-CM | POA: Diagnosis present

## 2017-06-22 DIAGNOSIS — R1031 Right lower quadrant pain: Secondary | ICD-10-CM | POA: Diagnosis not present

## 2017-06-22 DIAGNOSIS — M19049 Primary osteoarthritis, unspecified hand: Secondary | ICD-10-CM | POA: Diagnosis present

## 2017-06-22 DIAGNOSIS — R402142 Coma scale, eyes open, spontaneous, at arrival to emergency department: Secondary | ICD-10-CM | POA: Diagnosis present

## 2017-06-22 DIAGNOSIS — N200 Calculus of kidney: Secondary | ICD-10-CM

## 2017-06-22 DIAGNOSIS — N1330 Unspecified hydronephrosis: Secondary | ICD-10-CM | POA: Diagnosis not present

## 2017-06-22 DIAGNOSIS — E7800 Pure hypercholesterolemia, unspecified: Secondary | ICD-10-CM | POA: Diagnosis not present

## 2017-06-22 DIAGNOSIS — Z888 Allergy status to other drugs, medicaments and biological substances status: Secondary | ICD-10-CM

## 2017-06-22 DIAGNOSIS — R319 Hematuria, unspecified: Secondary | ICD-10-CM | POA: Diagnosis not present

## 2017-06-22 DIAGNOSIS — B9620 Unspecified Escherichia coli [E. coli] as the cause of diseases classified elsewhere: Secondary | ICD-10-CM | POA: Diagnosis present

## 2017-06-22 DIAGNOSIS — K219 Gastro-esophageal reflux disease without esophagitis: Secondary | ICD-10-CM | POA: Diagnosis present

## 2017-06-22 DIAGNOSIS — Z87891 Personal history of nicotine dependence: Secondary | ICD-10-CM | POA: Diagnosis not present

## 2017-06-22 DIAGNOSIS — G2581 Restless legs syndrome: Secondary | ICD-10-CM | POA: Diagnosis not present

## 2017-06-22 DIAGNOSIS — R509 Fever, unspecified: Secondary | ICD-10-CM | POA: Diagnosis not present

## 2017-06-22 DIAGNOSIS — I10 Essential (primary) hypertension: Secondary | ICD-10-CM | POA: Diagnosis not present

## 2017-06-22 DIAGNOSIS — Z8249 Family history of ischemic heart disease and other diseases of the circulatory system: Secondary | ICD-10-CM | POA: Diagnosis not present

## 2017-06-22 DIAGNOSIS — Z7982 Long term (current) use of aspirin: Secondary | ICD-10-CM

## 2017-06-22 DIAGNOSIS — I1310 Hypertensive heart and chronic kidney disease without heart failure, with stage 1 through stage 4 chronic kidney disease, or unspecified chronic kidney disease: Secondary | ICD-10-CM | POA: Diagnosis not present

## 2017-06-22 DIAGNOSIS — Z85820 Personal history of malignant melanoma of skin: Secondary | ICD-10-CM | POA: Diagnosis not present

## 2017-06-22 DIAGNOSIS — N136 Pyonephrosis: Secondary | ICD-10-CM | POA: Diagnosis not present

## 2017-06-22 DIAGNOSIS — N132 Hydronephrosis with renal and ureteral calculous obstruction: Secondary | ICD-10-CM | POA: Diagnosis not present

## 2017-06-22 DIAGNOSIS — R7881 Bacteremia: Secondary | ICD-10-CM | POA: Diagnosis not present

## 2017-06-22 DIAGNOSIS — F419 Anxiety disorder, unspecified: Secondary | ICD-10-CM | POA: Diagnosis not present

## 2017-06-22 HISTORY — PX: CYSTOSCOPY WITH STENT PLACEMENT: SHX5790

## 2017-06-22 LAB — CBC WITH DIFFERENTIAL/PLATELET
BASOS ABS: 0 10*3/uL (ref 0.0–0.1)
Basophils Relative: 0 %
Eosinophils Absolute: 0 10*3/uL (ref 0.0–0.7)
Eosinophils Relative: 0 %
HEMATOCRIT: 29.1 % — AB (ref 39.0–52.0)
Hemoglobin: 10 g/dL — ABNORMAL LOW (ref 13.0–17.0)
LYMPHS ABS: 0.4 10*3/uL — AB (ref 0.7–4.0)
Lymphocytes Relative: 2 %
MCH: 30.3 pg (ref 26.0–34.0)
MCHC: 34.4 g/dL (ref 30.0–36.0)
MCV: 88.2 fL (ref 78.0–100.0)
MONO ABS: 1 10*3/uL (ref 0.1–1.0)
Monocytes Relative: 6 %
NEUTROS ABS: 16 10*3/uL — AB (ref 1.7–7.7)
Neutrophils Relative %: 92 %
Platelets: 222 10*3/uL (ref 150–400)
RBC: 3.3 MIL/uL — ABNORMAL LOW (ref 4.22–5.81)
RDW: 12.6 % (ref 11.5–15.5)
WBC: 17.3 10*3/uL — ABNORMAL HIGH (ref 4.0–10.5)

## 2017-06-22 LAB — COMPREHENSIVE METABOLIC PANEL
ALBUMIN: 3.2 g/dL — AB (ref 3.5–5.0)
ALT: 20 U/L (ref 17–63)
ANION GAP: 11 (ref 5–15)
AST: 22 U/L (ref 15–41)
Alkaline Phosphatase: 72 U/L (ref 38–126)
BILIRUBIN TOTAL: 0.4 mg/dL (ref 0.3–1.2)
BUN: 42 mg/dL — ABNORMAL HIGH (ref 6–20)
CHLORIDE: 95 mmol/L — AB (ref 101–111)
CO2: 23 mmol/L (ref 22–32)
Calcium: 8.8 mg/dL — ABNORMAL LOW (ref 8.9–10.3)
Creatinine, Ser: 3.21 mg/dL — ABNORMAL HIGH (ref 0.61–1.24)
GFR calc Af Amer: 21 mL/min — ABNORMAL LOW (ref >60)
GFR calc non Af Amer: 18 mL/min — ABNORMAL LOW (ref >60)
GLUCOSE: 150 mg/dL — AB (ref 65–99)
POTASSIUM: 3.8 mmol/L (ref 3.5–5.1)
SODIUM: 129 mmol/L — AB (ref 135–145)
TOTAL PROTEIN: 6.6 g/dL (ref 6.5–8.1)

## 2017-06-22 LAB — URINALYSIS, ROUTINE W REFLEX MICROSCOPIC
BILIRUBIN URINE: NEGATIVE
Glucose, UA: NEGATIVE mg/dL
Ketones, ur: NEGATIVE mg/dL
Nitrite: NEGATIVE
PH: 5 (ref 5.0–8.0)
Protein, ur: 100 mg/dL — AB
SPECIFIC GRAVITY, URINE: 1.014 (ref 1.005–1.030)

## 2017-06-22 LAB — I-STAT CG4 LACTIC ACID, ED: LACTIC ACID, VENOUS: 1.25 mmol/L (ref 0.5–1.9)

## 2017-06-22 SURGERY — CYSTOSCOPY, WITH STENT INSERTION
Anesthesia: General | Site: Ureter | Laterality: Right

## 2017-06-22 MED ORDER — ACETAMINOPHEN 500 MG PO TABS
1000.0000 mg | ORAL_TABLET | Freq: Four times a day (QID) | ORAL | Status: DC
  Administered 2017-06-23 – 2017-06-24 (×5): 1000 mg via ORAL
  Filled 2017-06-22 (×6): qty 2

## 2017-06-22 MED ORDER — PHENYLEPHRINE 40 MCG/ML (10ML) SYRINGE FOR IV PUSH (FOR BLOOD PRESSURE SUPPORT)
PREFILLED_SYRINGE | INTRAVENOUS | Status: AC
  Filled 2017-06-22: qty 10

## 2017-06-22 MED ORDER — FLUOXETINE HCL 20 MG PO CAPS
20.0000 mg | ORAL_CAPSULE | Freq: Every day | ORAL | Status: DC
  Administered 2017-06-23 – 2017-06-24 (×2): 20 mg via ORAL
  Filled 2017-06-22 (×2): qty 1

## 2017-06-22 MED ORDER — SUCCINYLCHOLINE CHLORIDE 200 MG/10ML IV SOSY
PREFILLED_SYRINGE | INTRAVENOUS | Status: AC
  Filled 2017-06-22: qty 10

## 2017-06-22 MED ORDER — AMLODIPINE BESYLATE 5 MG PO TABS
5.0000 mg | ORAL_TABLET | Freq: Every day | ORAL | Status: DC
  Administered 2017-06-23 – 2017-06-24 (×2): 5 mg via ORAL
  Filled 2017-06-22 (×2): qty 1

## 2017-06-22 MED ORDER — ONDANSETRON HCL 4 MG/2ML IJ SOLN
4.0000 mg | INTRAMUSCULAR | Status: DC | PRN
  Administered 2017-06-23: 4 mg via INTRAVENOUS
  Filled 2017-06-22: qty 2

## 2017-06-22 MED ORDER — ACETAMINOPHEN 10 MG/ML IV SOLN
INTRAVENOUS | Status: AC
  Filled 2017-06-22: qty 100

## 2017-06-22 MED ORDER — LIDOCAINE 2% (20 MG/ML) 5 ML SYRINGE
INTRAMUSCULAR | Status: AC
  Filled 2017-06-22: qty 5

## 2017-06-22 MED ORDER — PIPERACILLIN-TAZOBACTAM 3.375 G IVPB
3.3750 g | Freq: Three times a day (TID) | INTRAVENOUS | Status: DC
  Administered 2017-06-23 (×2): 3.375 g via INTRAVENOUS
  Filled 2017-06-22 (×2): qty 50

## 2017-06-22 MED ORDER — ZOLPIDEM TARTRATE 5 MG PO TABS
5.0000 mg | ORAL_TABLET | Freq: Every evening | ORAL | Status: DC | PRN
  Administered 2017-06-22 – 2017-06-24 (×2): 5 mg via ORAL
  Filled 2017-06-22 (×2): qty 1

## 2017-06-22 MED ORDER — DOCUSATE SODIUM 100 MG PO CAPS
100.0000 mg | ORAL_CAPSULE | Freq: Two times a day (BID) | ORAL | Status: DC
  Administered 2017-06-22 – 2017-06-24 (×4): 100 mg via ORAL
  Filled 2017-06-22 (×4): qty 1

## 2017-06-22 MED ORDER — ROSUVASTATIN CALCIUM 20 MG PO TABS
20.0000 mg | ORAL_TABLET | Freq: Every day | ORAL | Status: DC
  Administered 2017-06-23 – 2017-06-24 (×2): 20 mg via ORAL
  Filled 2017-06-22 (×2): qty 1

## 2017-06-22 MED ORDER — FENTANYL CITRATE (PF) 100 MCG/2ML IJ SOLN
INTRAMUSCULAR | Status: DC | PRN
  Administered 2017-06-22: 50 ug via INTRAVENOUS
  Administered 2017-06-22 (×2): 25 ug via INTRAVENOUS

## 2017-06-22 MED ORDER — SODIUM CHLORIDE 0.9 % IV SOLN
1.0000 g | INTRAVENOUS | Status: DC
  Administered 2017-06-22: 1 g via INTRAVENOUS
  Filled 2017-06-22: qty 10

## 2017-06-22 MED ORDER — PIPERACILLIN-TAZOBACTAM 3.375 G IVPB 30 MIN
3.3750 g | Freq: Once | INTRAVENOUS | Status: AC
  Administered 2017-06-22: 3.375 g via INTRAVENOUS
  Filled 2017-06-22: qty 50

## 2017-06-22 MED ORDER — PROMETHAZINE HCL 25 MG/ML IJ SOLN: 6.2500 mg | INTRAMUSCULAR | Status: DC | PRN

## 2017-06-22 MED ORDER — ASPIRIN EC 81 MG PO TBEC
81.0000 mg | DELAYED_RELEASE_TABLET | Freq: Every day | ORAL | Status: DC
  Administered 2017-06-22 – 2017-06-24 (×3): 81 mg via ORAL
  Filled 2017-06-22 (×3): qty 1

## 2017-06-22 MED ORDER — ACETAMINOPHEN 10 MG/ML IV SOLN
1000.0000 mg | Freq: Once | INTRAVENOUS | Status: AC
  Administered 2017-06-22: 1000 mg via INTRAVENOUS

## 2017-06-22 MED ORDER — SODIUM CHLORIDE 0.9 % IR SOLN
Status: DC | PRN
  Administered 2017-06-22: 3000 mL

## 2017-06-22 MED ORDER — SUCCINYLCHOLINE CHLORIDE 200 MG/10ML IV SOSY
PREFILLED_SYRINGE | INTRAVENOUS | Status: DC | PRN
  Administered 2017-06-22: 120 mg via INTRAVENOUS

## 2017-06-22 MED ORDER — FENTANYL CITRATE (PF) 100 MCG/2ML IJ SOLN: 25.0000 ug | INTRAMUSCULAR | Status: DC | PRN

## 2017-06-22 MED ORDER — METOPROLOL SUCCINATE ER 25 MG PO TB24
25.0000 mg | ORAL_TABLET | Freq: Every day | ORAL | Status: DC
  Administered 2017-06-23 – 2017-06-24 (×2): 25 mg via ORAL
  Filled 2017-06-22 (×2): qty 1

## 2017-06-22 MED ORDER — ONDANSETRON HCL 4 MG/2ML IJ SOLN
4.0000 mg | Freq: Once | INTRAMUSCULAR | Status: AC
  Administered 2017-06-22: 4 mg via INTRAVENOUS
  Filled 2017-06-22: qty 2

## 2017-06-22 MED ORDER — FENTANYL CITRATE (PF) 100 MCG/2ML IJ SOLN
INTRAMUSCULAR | Status: AC
  Filled 2017-06-22: qty 2

## 2017-06-22 MED ORDER — TAMSULOSIN HCL 0.4 MG PO CAPS
0.4000 mg | ORAL_CAPSULE | Freq: Every day | ORAL | Status: DC
  Administered 2017-06-22 – 2017-06-23 (×2): 0.4 mg via ORAL
  Filled 2017-06-22 (×2): qty 1

## 2017-06-22 MED ORDER — ONDANSETRON HCL 4 MG/2ML IJ SOLN
INTRAMUSCULAR | Status: DC | PRN
  Administered 2017-06-22: 4 mg via INTRAVENOUS

## 2017-06-22 MED ORDER — LIDOCAINE 2% (20 MG/ML) 5 ML SYRINGE
INTRAMUSCULAR | Status: DC | PRN
  Administered 2017-06-22: 60 mg via INTRAVENOUS

## 2017-06-22 MED ORDER — DEXAMETHASONE SODIUM PHOSPHATE 10 MG/ML IJ SOLN
INTRAMUSCULAR | Status: DC | PRN
  Administered 2017-06-22: 10 mg via INTRAVENOUS

## 2017-06-22 MED ORDER — SODIUM CHLORIDE 0.9 % IV BOLUS (SEPSIS)
1000.0000 mL | Freq: Once | INTRAVENOUS | Status: AC
  Administered 2017-06-22: 1000 mL via INTRAVENOUS

## 2017-06-22 MED ORDER — PROPOFOL 10 MG/ML IV BOLUS
INTRAVENOUS | Status: DC | PRN
  Administered 2017-06-22: 150 mg via INTRAVENOUS

## 2017-06-22 MED ORDER — PHENYLEPHRINE HCL 10 MG/ML IJ SOLN
INTRAMUSCULAR | Status: DC | PRN
  Administered 2017-06-22 (×2): 80 ug via INTRAVENOUS

## 2017-06-22 MED ORDER — OXYCODONE HCL 5 MG PO TABS: 5.0000 mg | ORAL_TABLET | ORAL | Status: DC | PRN

## 2017-06-22 MED ORDER — PROPOFOL 10 MG/ML IV BOLUS
INTRAVENOUS | Status: AC
  Filled 2017-06-22: qty 20

## 2017-06-22 MED ORDER — MORPHINE SULFATE (PF) 4 MG/ML IV SOLN: 2.0000 mg | INTRAVENOUS | Status: DC | PRN

## 2017-06-22 MED ORDER — SENNA 8.6 MG PO TABS
1.0000 | ORAL_TABLET | Freq: Two times a day (BID) | ORAL | Status: DC
  Administered 2017-06-22 – 2017-06-24 (×4): 8.6 mg via ORAL
  Filled 2017-06-22 (×4): qty 1

## 2017-06-22 MED ORDER — SODIUM CHLORIDE 0.9 % IV SOLN
INTRAVENOUS | Status: DC
  Administered 2017-06-22 – 2017-06-24 (×7): via INTRAVENOUS

## 2017-06-22 MED ORDER — 0.9 % SODIUM CHLORIDE (POUR BTL) OPTIME
TOPICAL | Status: DC | PRN
  Administered 2017-06-22: 1000 mL

## 2017-06-22 SURGICAL SUPPLY — 15 items
BAG URO CATCHER STRL LF (MISCELLANEOUS) ×3 IMPLANT
CATH FOLEY 2WAY SLVR  5CC 16FR (CATHETERS) ×2
CATH FOLEY 2WAY SLVR 5CC 16FR (CATHETERS) ×1 IMPLANT
CATH INTERMIT  6FR 70CM (CATHETERS) ×3 IMPLANT
CLOTH BEACON ORANGE TIMEOUT ST (SAFETY) ×3 IMPLANT
COVER FOOTSWITCH UNIV (MISCELLANEOUS) IMPLANT
COVER SURGICAL LIGHT HANDLE (MISCELLANEOUS) ×3 IMPLANT
GLOVE BIOGEL M STRL SZ7.5 (GLOVE) ×3 IMPLANT
GOWN STRL REUS W/TWL LRG LVL3 (GOWN DISPOSABLE) ×6 IMPLANT
GUIDEWIRE STR DUAL SENSOR (WIRE) ×3 IMPLANT
MANIFOLD NEPTUNE II (INSTRUMENTS) ×3 IMPLANT
PACK CYSTO (CUSTOM PROCEDURE TRAY) ×3 IMPLANT
STENT URET 6FRX26 CONTOUR (STENTS) ×3 IMPLANT
TUBING CONNECTING 10 (TUBING) ×2 IMPLANT
TUBING CONNECTING 10' (TUBING) ×1

## 2017-06-22 NOTE — Consult Note (Signed)
Urology Consult   Physician requesting consult: Dr. Zenia Resides  Reason for consult: Febrile UTI and possible right ureteral stone  History of Present Illness: Lucas Hardy is a 72 y.o. with a 5 day history of recurrent fever as high as 104 F.  He has had shaking chills and mild right flank pain.  He had a culture from Monday indicating ESBL that returned today.  He tested negative for influenza.  He presented to the ED today and a CT scan indicated a possible 5 mm right ureteral stone with right hydronephrosis noted although it was felt, after review with radiology, that the proposed stone might be a vascular calcification although it could not be determined definitively.  He has had nausea and vomiting.  He had no history of urolithiasis.  Past Medical History:  Diagnosis Date  . Anxiety   . Arthritis of hand, degenerative   . Bursitis of elbow   . Cerumen impaction   . CKD (chronic kidney disease), stage IV ([place])    Lucas Hardy 05/30/2017  . Depression   . Diastolic murmur   . Dysplastic nevus of trunk   . ED (erectile dysfunction)   . Enlarged prostate   . 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 Fatigue   . GERD (gastroesophageal reflux disease)   . Hemorrhoids   . Herpes simplex with complication   . Hypercholesteremia   . Hyperlipidemia   . Hypertension   . Insomnia   . Leaky heart valve   . Lightheaded   . Lung nodule   . Melanoma ([place])    LLE  . Memory loss   . Myalgia   . Myositis   . On long term drug therapy   . Pain in joint, hand   . Paresthesias   . Pneumonia    "when I was a child" (05/30/2017)  . Prostatism   . Radiculopathy of lumbar region   . Rectal bleeding   . Restless legs syndrome (RLS)   . Seborrheic keratoses, inflamed   . Sinusitis, acute   . Vitamin D deficiency     Past Surgical History:  Procedure Laterality Date  . FINGER SURGERY Left    "cut end off my pointer finger; had it reattached"  . INGUINAL  HERNIA REPAIR Left ?1998  . LEFT HEART CATH AND CORONARY ANGIOGRAPHY N/A 05/31/2017   Procedure: LEFT HEART CATH AND CORONARY ANGIOGRAPHY;  Surgeon: Martinique, Peter M, MD;  Location: [facility];  Service: Cardiovascular;  Laterality: N/A;  . MELANOMA EXCISION     LLE  . TONSILLECTOMY  ~ 1963  . TRANSPHENOIDAL PITUITARY RESECTION  1997    Current Hospital Medications:  Home Meds:  Current Meds  Medication Sig  . acetaminophen (TYLENOL) 500 MG tablet Take 1,000 mg by mouth daily as needed for fever.  Marland Kitchen amLODipine (NORVASC) 5 MG tablet Take 5 mg by mouth daily.  Marland Kitchen aspirin 81 MG tablet Take 81 mg by mouth daily.  . Cholecalciferol 2000 units CAPS Take 2,000 Units by mouth daily.  Marland Kitchen FLUoxetine (PROZAC) 20 MG capsule Take 20 mg by mouth daily.  Marland Kitchen ibuprofen (ADVIL,MOTRIN) 200 MG tablet Take 400 mg by mouth daily as needed for fever.  . metoprolol succinate (TOPROL XL) 25 MG 24 hr tablet Take 0.5 tablets (12.5 mg total) by mouth daily.  . Multiple Vitamin (MULTIVITAMIN WITH MINERALS) TABS tablet Take 1 tablet by mouth daily.  Marland Kitchen neomycin-polymyxin-pramoxine (NEOSPORIN PLUS) 1 % cream Apply 1 application  topically daily. APPLY TO UPPER LIP AND CATHETER  . ondansetron (ZOFRAN) 4 MG tablet Take 4 mg by mouth every 8 (eight) hours as needed.  . rosuvastatin (CRESTOR) 20 MG tablet Take 20 mg by mouth daily.  Marland Kitchen sulfamethoxazole-trimethoprim (BACTRIM DS,SEPTRA DS) 800-160 MG tablet Take 1 tablet by mouth every 12 (twelve) hours.  . tamsulosin (FLOMAX) 0.4 MG CAPS capsule Take 0.4 mg by mouth daily.     Scheduled Meds: Continuous Infusions: . sodium chloride    . piperacillin-tazobactam     PRN Meds:.  Allergies:  Allergies  Allergen Reactions  . Effexor [Venlafaxine]     Anxiety     Family History  Problem Relation Age of Onset  . Hypertension Father     Social History:  reports that he quit smoking about 39 years ago. His smoking use included cigarettes. He has a 15.00  pack-year smoking history. He has quit using smokeless tobacco. His smokeless tobacco use included chew. He reports that he does not drink alcohol or use drugs.  ROS: A complete review of systems was performed.  All systems are negative except for pertinent findings as noted.  Physical Exam:  Vital signs in last 24 hours: Temp:  [98 F (36.7 C)] 98 F (36.7 C) (03/20 1154) Pulse Rate:  [65-95] 95 (03/20 1600) Resp:  [16-17] 16 (03/20 1600) BP: (107-146)/(65-78) 146/78 (03/20 1600) SpO2:  [99 %-100 %] 100 % (03/20 1600) Weight:  [81.6 kg (180 lb)] 81.6 kg (180 lb) (03/20 1152) Constitutional:  Alert and oriented, No acute distress Cardiovascular: Regular rate and rhythm, No JVD Respiratory: Normal respiratory effort, Lungs clear bilaterally GI: Abdomen is soft, right CVAT, mild epigastric tenderness Lymphatic: No lymphadenopathy Neurologic: Grossly intact, no focal deficits Psychiatric: Normal mood and affect  Laboratory Data:  Recent Labs    06/22/17 1204  WBC 17.3*  HGB 10.0*  HCT 29.1*  PLT 222    Recent Labs    06/22/17 1204  NA 129*  K 3.8  CL 95*  GLUCOSE 150*  BUN 42*  CALCIUM 8.8*  CREATININE 3.21*     Results for orders placed or performed during the hospital encounter of 06/22/17 (from the past 24 hour(s))  Comprehensive metabolic panel     Status: Abnormal   Collection Time: 06/22/17 12:04 PM  Result Value Ref Range   Sodium 129 (L) 135 - 145 mmol/L   Potassium 3.8 3.5 - 5.1 mmol/L   Chloride 95 (L) 101 - 111 mmol/L   CO2 23 22 - 32 mmol/L   Glucose, Bld 150 (H) 65 - 99 mg/dL   BUN 42 (H) 6 - 20 mg/dL   Creatinine, Ser 3.21 (H) 0.61 - 1.24 mg/dL   Calcium 8.8 (L) 8.9 - 10.3 mg/dL   Total Protein 6.6 6.5 - 8.1 g/dL   Albumin 3.2 (L) 3.5 - 5.0 g/dL   AST 22 15 - 41 U/L   ALT 20 17 - 63 U/L   Alkaline Phosphatase 72 38 - 126 U/L   Total Bilirubin 0.4 0.3 - 1.2 mg/dL   GFR calc non Af Amer 18 (L) >60 mL/min   GFR calc Af Amer 21 (L) >60 mL/min    Anion gap 11 5 - 15  CBC with Differential     Status: Abnormal   Collection Time: 06/22/17 12:04 PM  Result Value Ref Range   WBC 17.3 (H) 4.0 - 10.5 K/uL   RBC 3.30 (L) 4.22 - 5.81 MIL/uL   Hemoglobin 10.0 (L) 13.0 -  17.0 g/dL   HCT 29.1 (L) 39.0 - 52.0 %   MCV 88.2 78.0 - 100.0 fL   MCH 30.3 26.0 - 34.0 pg   MCHC 34.4 30.0 - 36.0 g/dL   RDW 12.6 11.5 - 15.5 %   Platelets 222 150 - 400 K/uL   Neutrophils Relative % 92 %   Neutro Abs 16.0 (H) 1.7 - 7.7 K/uL   Lymphocytes Relative 2 %   Lymphs Abs 0.4 (L) 0.7 - 4.0 K/uL   Monocytes Relative 6 %   Monocytes Absolute 1.0 0.1 - 1.0 K/uL   Eosinophils Relative 0 %   Eosinophils Absolute 0.0 0.0 - 0.7 K/uL   Basophils Relative 0 %   Basophils Absolute 0.0 0.0 - 0.1 K/uL  I-Stat CG4 Lactic Acid, ED     Status: None   Collection Time: 06/22/17 12:10 PM  Result Value Ref Range   Lactic Acid, Venous 1.25 0.5 - 1.9 mmol/L  Urinalysis, Routine w reflex microscopic     Status: Abnormal   Collection Time: 06/22/17  2:19 PM  Result Value Ref Range   Color, Urine YELLOW YELLOW   APPearance CLOUDY (A) CLEAR   Specific Gravity, Urine 1.014 1.005 - 1.030   pH 5.0 5.0 - 8.0   Glucose, UA NEGATIVE NEGATIVE mg/dL   Hgb urine dipstick MODERATE (A) NEGATIVE   Bilirubin Urine NEGATIVE NEGATIVE   Ketones, ur NEGATIVE NEGATIVE mg/dL   Protein, ur 100 (A) NEGATIVE mg/dL   Nitrite NEGATIVE NEGATIVE   Leukocytes, UA LARGE (A) NEGATIVE   RBC / HPF 6-30 0 - 5 RBC/hpf   WBC, UA TOO NUMEROUS TO COUNT 0 - 5 WBC/hpf   Bacteria, UA FEW (A) NONE SEEN   Squamous Epithelial / LPF 0-5 (A) NONE SEEN   WBC Clumps PRESENT    Mucus PRESENT    Hyaline Casts, UA PRESENT    No results found for this or any previous visit (from the past 240 hour(s)).  Renal Function: Recent Labs    06/22/17 1204  CREATININE 3.21*   Estimated Creatinine Clearance: 22.8 mL/min (A) (by C-G formula based on SCr of 3.21 mg/dL (H)).  Radiologic Imaging: Ct Renal Stone  Study  Result Date: 06/22/2017 CLINICAL DATA:  Flank pain EXAM: CT ABDOMEN AND PELVIS WITHOUT CONTRAST TECHNIQUE: Multidetector CT imaging of the abdomen and pelvis was performed following the standard protocol without IV contrast. COMPARISON:  None. FINDINGS: Lower chest: No basilar pulmonary nodules or pleural effusion. No apical pericardial effusion. Hepatobiliary: Normal hepatic contours and density. No visible biliary dilatation. Normal gallbladder. Pancreas: Normal parenchymal contours without ductal dilatation. No peripancreatic fluid collection. Spleen: Normal. Adrenals/Urinary Tract: --Adrenal glands: Normal. --Right kidney/ureter: Large right renal cyst measures 8.2 cm. There is mild right hydroureteronephrosis with moderate perinephric stranding. There is a stone within the distal ureter measuring 5 mm. No other right nephroureterolithiasis. --Left kidney/ureter: Multiple renal cysts measuring up to 6.2 cm. Mild left hydroureteronephrosis without obstructing lesion identified. --Urinary bladder: Hyperdense appearance of the bladder. There is a Foley catheter within. Stomach/Bowel: --Stomach/Duodenum: No hiatal hernia or other gastric abnormality. Normal duodenal course. --Small bowel: No dilatation or inflammation. --Colon: No focal abnormality. --Appendix: Normal. Vascular/Lymphatic: Atherosclerotic calcification is present within the non-aneurysmal abdominal aorta, without hemodynamically significant stenosis. No abdominal or pelvic lymphadenopathy. Reproductive: Prostate is enlarged measuring 6.0 cm in transverse dimension. Musculoskeletal. No bony spinal canal stenosis or focal osseous abnormality. Other: None. IMPRESSION: 1. Right-sided obstructive uropathy with 5 mm distal ureteral stone causing mild hydroureteronephrosis  and moderate perinephric stranding. 2. Mild left hydronephrosis without obstructing ureteral lesion. 3. Hyperdense appearance of the urinary bladder contents may indicate blood  products or neoplasm. Consider correlation with cystoscopy. 4. Enlarged prostate. Electronically Signed   By: Ulyses Jarred M.D.   On: 06/22/2017 15:07    I independently reviewed the above imaging studies.  I think the finding of a right ureteral stone is equivocal and maybe a vascular calcification.  Reviewed films with radiology.  Impression/Recommendation 1) Febrile UTI with possible right ureteral stone: After considering the possibilities, in light of not being able to definitively determine if he has a true right ureteral stone (I am doubtful), I think it will be best to proceed with right ureteral stent placement.  His culture from our office on Monday was ESBL sensitive to Zosyn which will be started.  He will be admitted for IV hydration and IV antibiotics.  Lucas Hardy,LES 06/22/2017, 4:25 PM    Pryor Curia MD   CC: Dr. Lacretia Leigh Dr. Burman Nieves

## 2017-06-22 NOTE — Progress Notes (Signed)
Pharmacy Antibiotic Note  Lucas Hardy is a 72 y.o. male admitted on 06/22/2017 with UTI.  He is s/p R ureteral stent today.  Pharmacy has been consulted for Zosyn dosing.  Noted (per Dr Alinda Money note from today) that cx in urology office was ESBL+, sensitive to Zosyn.    Plan: Zosyn 3.375gm IV Q8h to be infused over 4hrs Monitor renal function & adjust dose as appropriate  Height: 5\' 11"  (180.3 cm) Weight: 180 lb (81.6 kg) IBW/kg (Calculated) : 75.3  Temp (24hrs), Avg:100.2 F (37.9 C), Min:98 F (36.7 C), Max:102.3 F (39.1 C)  Recent Labs  Lab 06/22/17 1204 06/22/17 1210  WBC 17.3*  --   CREATININE 3.21*  --   LATICACIDVEN  --  1.25    Estimated Creatinine Clearance: 22.8 mL/min (A) (by C-G formula based on SCr of 3.21 mg/dL (H)).    Allergies  Allergen Reactions  . Effexor [Venlafaxine]     Anxiety     Antimicrobials this admission: 3/20 Rocephin x1 in ED 3/20 Zosyn >>   Dose adjustments this admission:  Microbiology results: 3/20 BCx:   Thank you for allowing pharmacy to be a part of this patient's care.  Biagio Borg 06/22/2017 5:57 PM

## 2017-06-22 NOTE — ED Notes (Signed)
Pt had drawn for labs:  Red Gold Blue Lavender Lt green Dark green x2

## 2017-06-22 NOTE — Anesthesia Postprocedure Evaluation (Signed)
Anesthesia Post Note  Patient: Lucas Hardy  Procedure(s) Performed: CYSTOSCOPY WITH RIGHT URETERAL STENT PLACEMENT (Right Ureter)     Patient location during evaluation: PACU Anesthesia Type: General Level of consciousness: awake and alert Pain management: pain level controlled Vital Signs Assessment: post-procedure vital signs reviewed and stable Respiratory status: spontaneous breathing, nonlabored ventilation, respiratory function stable and patient connected to nasal cannula oxygen Cardiovascular status: blood pressure returned to baseline and stable Postop Assessment: no apparent nausea or vomiting Anesthetic complications: no    Last Vitals:  Vitals:   06/22/17 1845 06/22/17 1900  BP: 113/63 118/64  Pulse: 74 74  Resp: (!) 26 (!) 22  Temp:  (!) 38.1 C  SpO2: 94% 94%    Last Pain:  Vitals:   06/22/17 1845  TempSrc:   PainSc: 0-No pain                 Tiajuana Amass

## 2017-06-22 NOTE — Transfer of Care (Signed)
Immediate Anesthesia Transfer of Care Note  Patient: Lucas Hardy  Procedure(s) Performed: Procedure(s): CYSTOSCOPY WITH RIGHT URETERAL STENT PLACEMENT (Right)  Patient Location: PACU  Anesthesia Type:General  Level of Consciousness:  sedated, patient cooperative and responds to stimulation  Airway & Oxygen Therapy:Patient Spontanous Breathing and Patient connected to face mask oxgen  Post-op Assessment:  Report given to PACU RN and Post -op Vital signs reviewed and stable  Post vital signs:  Reviewed and stable  Last Vitals:  Vitals:   06/22/17 1758 06/22/17 1800  BP: 95/60   Pulse: 79 78  Resp: 16 20  Temp: (!) 38.8 C   SpO2: 07% 61%    Complications: No apparent anesthesia complications

## 2017-06-22 NOTE — Anesthesia Preprocedure Evaluation (Addendum)
Anesthesia Evaluation  Patient identified by MRN, date of birth, ID band Patient awake    Reviewed: Allergy & Precautions, NPO status , Patient's Chart, lab work & pertinent test results, reviewed documented beta blocker date and time   Airway Mallampati: II  TM Distance: >3 FB Neck ROM: Full    Dental  (+) Dental Advisory Given   Pulmonary former smoker,    breath sounds clear to auscultation       Cardiovascular hypertension, Pt. on medications and Pt. on home beta blockers  Rhythm:Regular Rate:Normal  05/31/17 LHC. Non-obstructive CAD.   Neuro/Psych Anxiety Depression  Neuromuscular disease    GI/Hepatic Neg liver ROS, GERD  ,  Endo/Other  negative endocrine ROS  Renal/GU Renal InsufficiencyRenal disease     Musculoskeletal  (+) Arthritis ,   Abdominal   Peds  Hematology  (+) anemia ,   Anesthesia Other Findings   Reproductive/Obstetrics                            Lab Results  Component Value Date   WBC 17.3 (H) 06/22/2017   HGB 10.0 (L) 06/22/2017   HCT 29.1 (L) 06/22/2017   MCV 88.2 06/22/2017   PLT 222 06/22/2017   Lab Results  Component Value Date   CREATININE 3.21 (H) 06/22/2017   BUN 42 (H) 06/22/2017   NA 129 (L) 06/22/2017   K 3.8 06/22/2017   CL 95 (L) 06/22/2017   CO2 23 06/22/2017    Anesthesia Physical Anesthesia Plan  ASA: III and emergent  Anesthesia Plan: General   Post-op Pain Management:    Induction: Intravenous and Rapid sequence  PONV Risk Score and Plan: 2 and Ondansetron, Dexamethasone and Treatment may vary due to age or medical condition  Airway Management Planned: Oral ETT  Additional Equipment:   Intra-op Plan:   Post-operative Plan: Extubation in OR  Informed Consent: I have reviewed the patients History and Physical, chart, labs and discussed the procedure including the risks, benefits and alternatives for the proposed anesthesia with  the patient or authorized representative who has indicated his/her understanding and acceptance.   Dental advisory given  Plan Discussed with: CRNA  Anesthesia Plan Comments:        Anesthesia Quick Evaluation

## 2017-06-22 NOTE — ED Notes (Signed)
Patient transported to CT 

## 2017-06-22 NOTE — Anesthesia Procedure Notes (Signed)
Procedure Name: Intubation Date/Time: 06/22/2017 5:25 PM Performed by: Anne Fu, CRNA Pre-anesthesia Checklist: Patient identified, Emergency Drugs available, Suction available, Patient being monitored and Timeout performed Patient Re-evaluated:Patient Re-evaluated prior to induction Oxygen Delivery Method: Circle system utilized Preoxygenation: Pre-oxygenation with 100% oxygen Induction Type: IV induction, Rapid sequence and Cricoid Pressure applied Laryngoscope Size: Mac and 4 Tube type: Oral Tube size: 7.5 mm Number of attempts: 1 Airway Equipment and Method: Stylet Placement Confirmation: ETT inserted through vocal cords under direct vision,  positive ETCO2 and breath sounds checked- equal and bilateral Secured at: 22 cm Tube secured with: Tape Dental Injury: Teeth and Oropharynx as per pre-operative assessment

## 2017-06-22 NOTE — ED Triage Notes (Signed)
Pt been to Urology and PCP on Monday, Tuesday and today due to fevers, chills, and on antibiotics for urinary infection. Pt has catheter for past couple weeks due to blockage in bladder. Sent to ED to rule out sepsis.

## 2017-06-22 NOTE — Op Note (Signed)
Preoperative diagnosis: Febrile urinary tract infection with right hydronephrosis and possible right ureteral calculus  Postoperative diagnosis: Febrile urinary tract infection with right hydronephrosis and possible right ureteral calculus  Procedures: 1.  Cystoscopy 2.  Right ureteral stent placement (6 x 26 - no string)  Surgeon: Pryor Curia MD  Resident: Dr. Jeralyn Ruths  Anesthesia: General  Complications: None  EBL: Minimal  Intraoperative findings: The patient was noted to have significant, coapting bilateral lobe hypertrophy of the lateral prostatic lobes.  He was noted to have a high bladder neck but without an intravesical component to his prostate.  The bladder was noted to be severely trabeculated throughout with edematous changes consistent with an indwelling catheter.  Indication: Lucas Hardy is a 72 year old gentleman who has had 5 days of intermittent fever up to 104 F last night with associated nausea and vomiting.  He presented to the emergency department today and a CT scan did demonstrate right-sided hydronephrosis with a questionable 5 mm ureteral stone at the level of the iliac vessels although this was equivocal and it may have been related to a possible vascular calcification after review with radiology.  Considering the questionable findings of a right ureteral stone and considering his febrile infection, it was recommended that he undergo ureteral stent placement.  The potential risks, complications, and alternative options were discussed in detail and informed consent was obtained.  Description of procedure: The patient was taken to the operating room and a general anesthetic was administered.  He was given preoperative antibiotics that were culture sensitive and prepped and draped in the usual sterile fashion.  He was placed in the dorsolithotomy position.  A preoperative timeout was performed.  Cystourethroscopy was performed with a 22 French cystoscope  sheath.  Findings are as described above.  The right ureteral orifice was identified and intubated with a 0.38 Sensor guidewire.  This was advanced up into the renal pelvis under fluoroscopic guidance.  No resistance was met.  A 6 x 26 double-J ureteral stent was then advanced over the wire using Seldinger technique and positioned appropriately in the renal pelvis as well as in the bladder.  The wire was removed with a good curl noted in the renal pelvis and the bladder.  A new Foley catheter was then inserted into the bladder due to his history of chronic urinary retention.

## 2017-06-22 NOTE — ED Provider Notes (Signed)
[facility] DEPT Provider Note   CSN: [id number] Arrival date & time: 06/22/17  1136     History   Chief Complaint Chief Complaint  Patient presents with  . Fever    HPI Lucas Hardy is a 72 y.o. male.  72 year old male presents with fever times 5 days.  No cough or congestion.  Patient does have a history of chronic indwelling Foley catheter.  Was seen by urology and placed on Bactrim for presumptive pyelonephritis.  Fever continues up to as much is 104.  Has had nausea but no vomiting.  Also had a negative flu test.  No cough or congestion.  No neck pain or photophobia or headache.  Slight bilateral flank pain.  Patient seen in the office today and sent here for further management.  Has been compliant with the Bactrim therapy.      Past Medical History:  Diagnosis Date  . Anxiety   . Arthritis of hand, degenerative   . Bursitis of elbow   . Cerumen impaction   . CKD (chronic kidney disease), stage IV ([place])    Lucas Hardy 05/30/2017  . Depression   . Diastolic murmur   . Dysplastic nevus of trunk   . ED (erectile dysfunction)   . Enlarged prostate   . 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 Fatigue   . GERD (gastroesophageal reflux disease)   . Hemorrhoids   . Herpes simplex with complication   . Hypercholesteremia   . Hyperlipidemia   . Hypertension   . Insomnia   . Leaky heart valve   . Lightheaded   . Lung nodule   . Melanoma ([place])    LLE  . Memory loss   . Myalgia   . Myositis   . On long term drug therapy   . Pain in joint, hand   . Paresthesias   . Pneumonia    "when I was a child" (05/30/2017)  . Prostatism   . Radiculopathy of lumbar region   . Rectal bleeding   . Restless legs syndrome (RLS)   . Seborrheic keratoses, inflamed   . Sinusitis, acute   . Vitamin D deficiency     Patient Active Problem List   Diagnosis Date Noted  . Chest pain 05/30/2017  . Depression 05/12/2013    . Insomnia 05/12/2013  . RLS (restless legs syndrome) 05/12/2013  . BPH (benign prostatic hyperplasia) 05/12/2013  . Mixed hyperlipidemia 05/12/2013    Past Surgical History:  Procedure Laterality Date  . FINGER SURGERY Left    "cut end off my pointer finger; had it reattached"  . INGUINAL HERNIA REPAIR Left ?1998  . LEFT HEART CATH AND CORONARY ANGIOGRAPHY N/A 05/31/2017   Procedure: LEFT HEART CATH AND CORONARY ANGIOGRAPHY;  Surgeon: Martinique, Peter M, MD;  Location: [facility];  Service: Cardiovascular;  Laterality: N/A;  . MELANOMA EXCISION     LLE  . TONSILLECTOMY  ~ 1963  . TRANSPHENOIDAL PITUITARY RESECTION  1997       Home Medications    Prior to Admission medications   Medication Sig Start Date End Date Taking? Authorizing Provider  amLODipine (NORVASC) 5 MG tablet Take 5 mg by mouth daily.    [provider]  aspirin 81 MG tablet Take 81 mg by mouth daily.    [provider]  Cholecalciferol 2000 units CAPS Take 2,000 Units by mouth daily.    [provider]  Coenzyme Q10 10 MG capsule  Take 10 mg by mouth daily.    [provider]  FLUoxetine (PROZAC) 20 MG capsule Take 20 mg by mouth daily.    [provider]  metoprolol succinate (TOPROL XL) 25 MG 24 hr tablet Take 0.5 tablets (12.5 mg total) by mouth daily. 06/15/17   [facility], Tami Lin, PA  Multiple Vitamin (MULTIVITAMIN WITH MINERALS) TABS tablet Take 1 tablet by mouth daily.    [provider]  rosuvastatin (CRESTOR) 20 MG tablet Take 20 mg by mouth daily.    [provider]  sildenafil (VIAGRA) 100 MG tablet Take 100 mg by mouth daily as needed for erectile dysfunction.    [provider]  tamsulosin (FLOMAX) 0.4 MG CAPS capsule Take 0.4 mg by mouth daily.     [provider]    Family History Family History  Problem Relation Age of Onset  . Hypertension Father     Social History Social History   Tobacco Use  .  Smoking status: Former Smoker    Packs/day: 1.00    Years: 15.00    Pack years: 15.00    Types: Cigarettes    Last attempt to quit: 04/11/1978    Years since quitting: 39.2  . Smokeless tobacco: Former Systems developer    Types: Chew  Substance Use Topics  . Alcohol use: No    Frequency: Never  . Drug use: No     Allergies   Effexor [venlafaxine]   Review of Systems Review of Systems  All other systems reviewed and are negative.    Physical Exam Updated Vital Signs BP 107/65 (BP Location: Left Arm)   Pulse 65   Temp 98 F (36.7 C) (Oral)   Resp 17   Ht 1.803 m (5\' 11" )   Wt 81.6 kg (180 lb)   SpO2 99%   BMI 25.10 kg/m   Physical Exam  Constitutional: He is oriented to person, place, and time. He appears well-developed and well-nourished.  Non-toxic appearance. No distress.  HENT:  Head: Normocephalic and atraumatic.  Eyes: Conjunctivae, EOM and lids are normal. Pupils are equal, round, and reactive to light.  Neck: Normal range of motion. Neck supple. No tracheal deviation present. No thyroid mass present.  Cardiovascular: Normal rate, regular rhythm and normal heart sounds. Exam reveals no gallop.  No murmur heard. Pulmonary/Chest: Effort normal and breath sounds normal. No stridor. No respiratory distress. He has no decreased breath sounds. He has no wheezes. He has no rhonchi. He has no rales.  Abdominal: Soft. Normal appearance and bowel sounds are normal. He exhibits no distension. There is no tenderness. There is no rebound and no CVA tenderness.  Musculoskeletal: Normal range of motion. He exhibits no edema or tenderness.  Neurological: He is alert and oriented to person, place, and time. He has normal strength. No cranial nerve deficit or sensory deficit. GCS eye subscore is 4. GCS verbal subscore is 5. GCS motor subscore is 6.  Skin: Skin is warm and dry. No abrasion and no rash noted.  Psychiatric: He has a normal mood and affect. His speech is normal and behavior is  normal.  Nursing note and vitals reviewed.    ED Treatments / Results  Labs (all labs ordered are listed, but only abnormal results are displayed) Labs Reviewed  COMPREHENSIVE METABOLIC PANEL - Abnormal; Notable for the following components:      Result Value   Sodium 129 (*)    Chloride 95 (*)    Glucose, Bld 150 (*)  BUN 42 (*)    Creatinine, Ser 3.21 (*)    Calcium 8.8 (*)    Albumin 3.2 (*)    GFR calc non Af Amer 18 (*)    GFR calc Af Amer 21 (*)    All other components within normal limits  CBC WITH DIFFERENTIAL/PLATELET - Abnormal; Notable for the following components:   WBC 17.3 (*)    RBC 3.30 (*)    Hemoglobin 10.0 (*)    HCT 29.1 (*)    Neutro Abs 16.0 (*)    Lymphs Abs 0.4 (*)    All other components within normal limits  URINALYSIS, ROUTINE W REFLEX MICROSCOPIC  I-STAT CG4 LACTIC ACID, ED  I-STAT CG4 LACTIC ACID, ED    EKG  EKG Interpretation None       Radiology No results found.  Procedures Procedures (including critical care time)  Medications Ordered in ED Medications  0.9 %  sodium chloride infusion (not administered)  sodium chloride 0.9 % bolus 1,000 mL (not administered)     Initial Impression / Assessment and Plan / ED Course  I have reviewed the triage vital signs and the nursing notes.  Pertinent labs & imaging results that were available during my care of the patient were reviewed by me and considered in my medical decision making (see chart for details).     Patient is urinalysis positive for infection.  He also has evidence of worsening renal function 2.  Patient's renal CT consistent with right-sided obstructive uropathy caused by 5 mm distal stone.  Patient started on Rocephin and will consult urology for stent placement Final Clinical Impressions(s) / ED Diagnoses   Final diagnoses:  None    ED Discharge Orders    None       Lacretia Leigh, MD 06/22/17 [phone]

## 2017-06-23 ENCOUNTER — Encounter (HOSPITAL_COMMUNITY): Payer: Self-pay | Admitting: Urology

## 2017-06-23 ENCOUNTER — Inpatient Hospital Stay: Payer: Self-pay

## 2017-06-23 LAB — BLOOD CULTURE ID PANEL (REFLEXED)
ACINETOBACTER BAUMANNII: NOT DETECTED
CANDIDA ALBICANS: NOT DETECTED
CANDIDA GLABRATA: NOT DETECTED
CANDIDA KRUSEI: NOT DETECTED
Candida parapsilosis: NOT DETECTED
Candida tropicalis: NOT DETECTED
Carbapenem resistance: NOT DETECTED
ENTEROBACTER CLOACAE COMPLEX: NOT DETECTED
ESCHERICHIA COLI: DETECTED — AB
Enterobacteriaceae species: DETECTED — AB
Enterococcus species: NOT DETECTED
Haemophilus influenzae: NOT DETECTED
KLEBSIELLA OXYTOCA: NOT DETECTED
Klebsiella pneumoniae: NOT DETECTED
Listeria monocytogenes: NOT DETECTED
Neisseria meningitidis: NOT DETECTED
PROTEUS SPECIES: NOT DETECTED
Pseudomonas aeruginosa: NOT DETECTED
SERRATIA MARCESCENS: NOT DETECTED
STAPHYLOCOCCUS SPECIES: NOT DETECTED
Staphylococcus aureus (BCID): NOT DETECTED
Streptococcus agalactiae: NOT DETECTED
Streptococcus pneumoniae: NOT DETECTED
Streptococcus pyogenes: NOT DETECTED
Streptococcus species: NOT DETECTED

## 2017-06-23 LAB — CBC
HCT: 28.8 % — ABNORMAL LOW (ref 39.0–52.0)
HEMOGLOBIN: 9.8 g/dL — AB (ref 13.0–17.0)
MCH: 30.2 pg (ref 26.0–34.0)
MCHC: 34 g/dL (ref 30.0–36.0)
MCV: 88.6 fL (ref 78.0–100.0)
Platelets: 197 10*3/uL (ref 150–400)
RBC: 3.25 MIL/uL — AB (ref 4.22–5.81)
RDW: 12.8 % (ref 11.5–15.5)
WBC: 14.8 10*3/uL — AB (ref 4.0–10.5)

## 2017-06-23 LAB — BASIC METABOLIC PANEL
ANION GAP: 13 (ref 5–15)
BUN: 44 mg/dL — ABNORMAL HIGH (ref 6–20)
CHLORIDE: 102 mmol/L (ref 101–111)
CO2: 20 mmol/L — AB (ref 22–32)
Calcium: 8.5 mg/dL — ABNORMAL LOW (ref 8.9–10.3)
Creatinine, Ser: 2.54 mg/dL — ABNORMAL HIGH (ref 0.61–1.24)
GFR calc non Af Amer: 24 mL/min — ABNORMAL LOW (ref >60)
GFR, EST AFRICAN AMERICAN: 28 mL/min — AB (ref >60)
Glucose, Bld: 150 mg/dL — ABNORMAL HIGH (ref 65–99)
POTASSIUM: 4.2 mmol/L (ref 3.5–5.1)
Sodium: 135 mmol/L (ref 135–145)

## 2017-06-23 MED ORDER — PANTOPRAZOLE SODIUM 40 MG IV SOLR
40.0000 mg | INTRAVENOUS | Status: DC
  Administered 2017-06-23: 40 mg via INTRAVENOUS
  Filled 2017-06-23: qty 40

## 2017-06-23 MED ORDER — TRAMADOL HCL 50 MG PO TABS
50.0000 mg | ORAL_TABLET | Freq: Four times a day (QID) | ORAL | Status: DC | PRN
  Filled 2017-06-23: qty 1

## 2017-06-23 MED ORDER — SODIUM CHLORIDE 0.9 % IV SOLN
1.0000 g | INTRAVENOUS | Status: DC
  Administered 2017-06-23 – 2017-06-24 (×2): 1 g via INTRAVENOUS
  Filled 2017-06-23 (×2): qty 1

## 2017-06-23 MED ORDER — GI COCKTAIL ~~LOC~~
30.0000 mL | Freq: Two times a day (BID) | ORAL | Status: DC | PRN
  Administered 2017-06-23: 30 mL via ORAL
  Filled 2017-06-23: qty 30

## 2017-06-23 MED ORDER — GI COCKTAIL ~~LOC~~
30.0000 mL | Freq: Once | ORAL | Status: AC
  Administered 2017-06-23: 30 mL via ORAL
  Filled 2017-06-23: qty 30

## 2017-06-23 MED ORDER — PANTOPRAZOLE SODIUM 40 MG IV SOLR
40.0000 mg | Freq: Two times a day (BID) | INTRAVENOUS | Status: DC
  Administered 2017-06-23 – 2017-06-24 (×2): 40 mg via INTRAVENOUS
  Filled 2017-06-23 (×2): qty 40

## 2017-06-23 NOTE — Progress Notes (Signed)
Urology Inpatient Progress Report  Renal stone [N20.0] Urinary tract infection with hematuria, site unspecified [N39.0, R31.9]  Procedure(s): CYSTOSCOPY WITH RIGHT URETERAL STENT PLACEMENT  1 Day Post-Op   Intv/Subj: Tolerated the stent yesterday, febrile in the PACU No acute events overnight. Sleeping this AM    Active Problems:   UTI (urinary tract infection)  Current Facility-Administered Medications  Medication Dose Route Frequency Provider Last Rate Last Dose  . 0.9 %  sodium chloride infusion   Intravenous Continuous Lacretia Leigh, MD 125 mL/hr at 06/23/17 0315    . acetaminophen (TYLENOL) tablet 1,000 mg  1,000 mg Oral Q6H Stasia Cavalier, MD   1,000 mg at 06/23/17 1610  . amLODipine (NORVASC) tablet 5 mg  5 mg Oral Daily Stasia Cavalier, MD      . aspirin EC tablet 81 mg  81 mg Oral Daily Stasia Cavalier, MD   81 mg at 06/22/17 2047  . docusate sodium (COLACE) capsule 100 mg  100 mg Oral BID Stasia Cavalier, MD   100 mg at 06/22/17 2256  . FLUoxetine (PROZAC) capsule 20 mg  20 mg Oral Daily Stasia Cavalier, MD      . gi cocktail (Maalox,Lidocaine,Donnatal)  30 mL Oral BID PRN Ardis Hughs, MD      . metoprolol succinate (TOPROL-XL) 24 hr tablet 25 mg  25 mg Oral Daily Stasia Cavalier, MD      . morphine 4 MG/ML injection 2 mg  2 mg Intravenous Q3H PRN Stasia Cavalier, MD      . ondansetron [facility]) injection 4 mg  4 mg Intravenous Q4H PRN Stasia Cavalier, MD   4 mg at 06/23/17 0610  . pantoprazole (PROTONIX) injection 40 mg  40 mg Intravenous Q24H Stasia Cavalier, MD      . piperacillin-tazobactam (ZOSYN) IVPB 3.375 g  3.375 g Intravenous Q8H Lacretia Leigh, MD   Stopped at 06/23/17 0451  . rosuvastatin (CRESTOR) tablet 20 mg  20 mg Oral Daily Stasia Cavalier, MD      . senna [facility]) tablet 8.6 mg  1 tablet Oral BID Stasia Cavalier, MD   8.6 mg at 06/22/17 2256  . tamsulosin (FLOMAX) capsule 0.4  mg  0.4 mg Oral QHS Stasia Cavalier, MD   0.4 mg at 06/22/17 2256  . traMADol (ULTRAM) tablet 50-100 mg  50-100 mg Oral Q6H PRN Ardis Hughs, MD      . zolpidem [facility]) tablet 5 mg  5 mg Oral QHS PRN Ardis Hughs, MD   5 mg at 06/22/17 2256     Objective: Vital: Vitals:   06/22/17 1915 06/22/17 1930 06/22/17 1946 06/23/17 0529  BP: 111/74 (!) 111/57  129/77  Pulse: 71 72  66  Resp: 17 16  18   Temp:  97.6 F (36.4 C) 99.3 F (37.4 C) 98 F (36.7 C)  TempSrc:   Oral Oral  SpO2: 94% 96%  97%  Weight:      Height:       I/Os: I/O last 3 completed shifts: In: 2200 [I.V.:1000; IV Piggyback:1200] Out: 2055 [Urine:2050; Blood:5]  Physical Exam:  General: Patient is in no apparent distress Lungs: Normal respiratory effort, chest expands symmetrically. Foley: clear  Ext: lower extremities symmetric  Lab Results: Recent Labs    06/22/17 1204 06/23/17 0521  WBC 17.3* 14.8*  HGB 10.0* 9.8*  HCT 29.1* 28.8*   Recent Labs    06/22/17 1204 06/23/17 0521  NA 129*  135  K 3.8 4.2  CL 95* 102  CO2 23 20*  GLUCOSE 150* 150*  BUN 42* 44*  CREATININE 3.21* 2.54*  CALCIUM 8.8* 8.5*   No results for input(s): LABPT, INR in the last 72 hours. No results for input(s): LABURIN in the last 72 hours. No results found for this or any previous visit.  Studies/Results: Dg C-arm 1-60 Min-no Report  Result Date: 06/22/2017 Fluoroscopy was utilized by the requesting physician.  No radiographic interpretation.   Ct Renal Stone Study  Result Date: 06/22/2017 CLINICAL DATA:  Flank pain EXAM: CT ABDOMEN AND PELVIS WITHOUT CONTRAST TECHNIQUE: Multidetector CT imaging of the abdomen and pelvis was performed following the standard protocol without IV contrast. COMPARISON:  None. FINDINGS: Lower chest: No basilar pulmonary nodules or pleural effusion. No apical pericardial effusion. Hepatobiliary: Normal hepatic contours and density. No visible biliary dilatation. Normal  gallbladder. Pancreas: Normal parenchymal contours without ductal dilatation. No peripancreatic fluid collection. Spleen: Normal. Adrenals/Urinary Tract: --Adrenal glands: Normal. --Right kidney/ureter: Large right renal cyst measures 8.2 cm. There is mild right hydroureteronephrosis with moderate perinephric stranding. There is a stone within the distal ureter measuring 5 mm. No other right nephroureterolithiasis. --Left kidney/ureter: Multiple renal cysts measuring up to 6.2 cm. Mild left hydroureteronephrosis without obstructing lesion identified. --Urinary bladder: Hyperdense appearance of the bladder. There is a Foley catheter within. Stomach/Bowel: --Stomach/Duodenum: No hiatal hernia or other gastric abnormality. Normal duodenal course. --Small bowel: No dilatation or inflammation. --Colon: No focal abnormality. --Appendix: Normal. Vascular/Lymphatic: Atherosclerotic calcification is present within the non-aneurysmal abdominal aorta, without hemodynamically significant stenosis. No abdominal or pelvic lymphadenopathy. Reproductive: Prostate is enlarged measuring 6.0 cm in transverse dimension. Musculoskeletal. No bony spinal canal stenosis or focal osseous abnormality. Other: None. IMPRESSION: 1. Right-sided obstructive uropathy with 5 mm distal ureteral stone causing mild hydroureteronephrosis and moderate perinephric stranding. 2. Mild left hydronephrosis without obstructing ureteral lesion. 3. Hyperdense appearance of the urinary bladder contents may indicate blood products or neoplasm. Consider correlation with cystoscopy. 4. Enlarged prostate. Electronically Signed   By: Ulyses Jarred M.D.   On: 06/22/2017 15:07    Assessment: Procedure(s): CYSTOSCOPY WITH RIGHT URETERAL STENT PLACEMENT, 1 Day Post-Op  doing well. Pyelonephritis - Blood cultures are pending.  ESBL e.coli  Plan: Continue with Zosyn with plans to transition to oral abx this PM Regular diet MIVF Sepsis protocol with  lactates.  Hopeful for discharge tomorrow.   Louis Meckel, MD Urology 06/23/2017, 7:46 AM

## 2017-06-23 NOTE — Progress Notes (Signed)
Arrived to discuss PICC placement with patient and wife. Both stated that the MD and HHN had spoke of placing a midline instead of a PICC.No midline order present. Primary RN attempted to page MD for clarification. PICC not placed tonight. Will follow up in AM to see if midline is ordered. If not, PICC will be placed.

## 2017-06-23 NOTE — Progress Notes (Signed)
PHARMACY - PHYSICIAN COMMUNICATION CRITICAL VALUE ALERT - BLOOD CULTURE IDENTIFICATION (BCID)  Lucas Hardy is an 72 y.o. male who presented to [facility] on 06/22/2017 with fever and right ureteral stone. Patient underwent cystoscopy with ureteral stent placement on 3/31.  Outpatient ucx on 3/18 had ESBL Ecoli.  1/2 BCx from 3/20 had GNR with Ecoli on BCID.  Name of physician (or Provider) Contacted: Louis Meckel  Current antibiotics: zosyn  Changes to prescribed antibiotics recommended:  Change abx to [facility] with plan to discharge patient on this   Results for orders placed or performed during the hospital encounter of 06/22/17  Blood Culture ID Panel (Reflexed) (Collected: 06/22/2017  3:25 PM)  Result Value Ref Range   Enterococcus species NOT DETECTED NOT DETECTED   Listeria monocytogenes NOT DETECTED NOT DETECTED   Staphylococcus species NOT DETECTED NOT DETECTED   Staphylococcus aureus NOT DETECTED NOT DETECTED   Streptococcus species NOT DETECTED NOT DETECTED   Streptococcus agalactiae NOT DETECTED NOT DETECTED   Streptococcus pneumoniae NOT DETECTED NOT DETECTED   Streptococcus pyogenes NOT DETECTED NOT DETECTED   Acinetobacter baumannii NOT DETECTED NOT DETECTED   Enterobacteriaceae species DETECTED (A) NOT DETECTED   Enterobacter cloacae complex NOT DETECTED NOT DETECTED   Escherichia coli DETECTED (A) NOT DETECTED   Klebsiella oxytoca NOT DETECTED NOT DETECTED   Klebsiella pneumoniae NOT DETECTED NOT DETECTED   Proteus species NOT DETECTED NOT DETECTED   Serratia marcescens NOT DETECTED NOT DETECTED   Carbapenem resistance NOT DETECTED NOT DETECTED   Haemophilus influenzae NOT DETECTED NOT DETECTED   Neisseria meningitidis NOT DETECTED NOT DETECTED   Pseudomonas aeruginosa NOT DETECTED NOT DETECTED   Candida albicans NOT DETECTED NOT DETECTED   Candida glabrata NOT DETECTED NOT DETECTED   Candida krusei NOT DETECTED NOT DETECTED   Candida parapsilosis NOT DETECTED NOT  DETECTED   Candida tropicalis NOT DETECTED NOT DETECTED    Dia Sitter P 06/23/2017  1:41 PM

## 2017-06-23 NOTE — Progress Notes (Signed)
PHARMACY CONSULT NOTE FOR:  OUTPATIENT  PARENTERAL ANTIBIOTIC THERAPY (OPAT)  Indication: Ecoli urosepsis Regimen: Invanz 1gm IV q24h  End date: 06/27/17  IV antibiotic discharge orders are pended. To discharging provider:  please sign these orders via discharge navigator,  Select New Orders & click on the button choice - Manage This Unsigned Work.     Thank you for allowing pharmacy to be a part of this patient's care.  Biagio Borg 06/23/2017, 7:08 PM

## 2017-06-23 NOTE — Progress Notes (Signed)
[facility] Infusion Coordinator will follow pt for to support Home Infusion Pharm[facility] services for IV ABX if needed at home upon DC.  If patient discharges after hours, please call [phone].   Larry Sierras 06/23/2017, 3:20 PM

## 2017-06-24 LAB — CBC
HEMATOCRIT: 29.9 % — AB (ref 39.0–52.0)
HEMOGLOBIN: 10.2 g/dL — AB (ref 13.0–17.0)
MCH: 30.3 pg (ref 26.0–34.0)
MCHC: 34.1 g/dL (ref 30.0–36.0)
MCV: 88.7 fL (ref 78.0–100.0)
Platelets: 190 10*3/uL (ref 150–400)
RBC: 3.37 MIL/uL — ABNORMAL LOW (ref 4.22–5.81)
RDW: 13 % (ref 11.5–15.5)
WBC: 17.3 10*3/uL — ABNORMAL HIGH (ref 4.0–10.5)

## 2017-06-24 LAB — BASIC METABOLIC PANEL
ANION GAP: 8 (ref 5–15)
BUN: 40 mg/dL — ABNORMAL HIGH (ref 6–20)
CO2: 21 mmol/L — AB (ref 22–32)
Calcium: 8.5 mg/dL — ABNORMAL LOW (ref 8.9–10.3)
Chloride: 106 mmol/L (ref 101–111)
Creatinine, Ser: 2.1 mg/dL — ABNORMAL HIGH (ref 0.61–1.24)
GFR calc Af Amer: 35 mL/min — ABNORMAL LOW (ref >60)
GFR calc non Af Amer: 30 mL/min — ABNORMAL LOW (ref >60)
GLUCOSE: 116 mg/dL — AB (ref 65–99)
POTASSIUM: 3.9 mmol/L (ref 3.5–5.1)
Sodium: 135 mmol/L (ref 135–145)

## 2017-06-24 MED ORDER — ERTAPENEM IV (FOR PTA / DISCHARGE USE ONLY): 1.0000 g | INTRAVENOUS | 0 refills | Status: AC

## 2017-06-24 MED ORDER — CIPROFLOXACIN HCL 500 MG PO TABS: 500.0000 mg | ORAL_TABLET | Freq: Two times a day (BID) | ORAL | 0 refills | Status: AC

## 2017-06-24 MED ORDER — TAMSULOSIN HCL 0.4 MG PO CAPS: 0.4000 mg | ORAL_CAPSULE | Freq: Every day | ORAL | 11 refills | Status: DC

## 2017-06-24 NOTE — Progress Notes (Signed)
Went over all discharge papers with patient.  All questions answered.  VSS.  Midline and foley to stay in for home per MD.   Foley and leg bag teaching preformed.  All questions answered.  WEnt over medications and AVS and prescriptions given.

## 2017-06-24 NOTE — Progress Notes (Signed)
Urology Inpatient Progress Report  Renal stone [N20.0] Urinary tract infection with hematuria, site unspecified [N39.0, R31.9]  Procedure(s): CYSTOSCOPY WITH RIGHT URETERAL STENT PLACEMENT  2 Days Post-Op   Intv/Subj: No acute events overnight. Patient is without complaint. Afebrile x 24 hours, started on ertapenem yesterday based on pharmacy and infectious disease recommendations.  He has tolerated this well. A midline IV was ordered, scheduled for placement this morning. The patient did have some nausea yesterday afternoon.  He was started on Protonix for this. No complaints this AM.   Active Problems:   UTI (urinary tract infection)  Current Facility-Administered Medications  Medication Dose Route Frequency Provider Last Rate Last Dose  . 0.9 %  sodium chloride infusion   Intravenous Continuous Lacretia Leigh, MD 125 mL/hr at 06/24/17 0042    . acetaminophen (TYLENOL) tablet 1,000 mg  1,000 mg Oral Q6H Stasia Cavalier, MD   1,000 mg at 06/24/17 0451  . amLODipine (NORVASC) tablet 5 mg  5 mg Oral Daily Stasia Cavalier, MD   5 mg at 06/24/17 [phone]  . aspirin EC tablet 81 mg  81 mg Oral Daily Stasia Cavalier, MD   81 mg at 06/24/17 4650  . docusate sodium (COLACE) capsule 100 mg  100 mg Oral BID Stasia Cavalier, MD   100 mg at 06/24/17 3546  . ertapenem (INVANZ) 1 g in sodium chloride 0.9 % 100 mL IVPB  1 g Intravenous Q24H Ardis Hughs, MD   Stopped at 06/23/17 1701  . FLUoxetine (PROZAC) capsule 20 mg  20 mg Oral Daily Stasia Cavalier, MD   20 mg at 06/24/17 [phone]  . gi cocktail (Maalox,Lidocaine,Donnatal)  30 mL Oral BID PRN Ardis Hughs, MD   30 mL at 06/23/17 1826  . metoprolol succinate (TOPROL-XL) 24 hr tablet 25 mg  25 mg Oral Daily Stasia Cavalier, MD   25 mg at 06/24/17 [phone]  . morphine 4 MG/ML injection 2 mg  2 mg Intravenous Q3H PRN Stasia Cavalier, MD      . ondansetron [facility]) injection 4 mg  4 mg Intravenous Q4H  PRN Stasia Cavalier, MD   4 mg at 06/23/17 0610  . pantoprazole (PROTONIX) injection 40 mg  40 mg Intravenous Q12H Ardis Hughs, MD   40 mg at 06/24/17 0837  . rosuvastatin (CRESTOR) tablet 20 mg  20 mg Oral Daily Stasia Cavalier, MD   20 mg at 06/24/17 [phone]  . senna (SENOKOT) tablet 8.6 mg  1 tablet Oral BID Stasia Cavalier, MD   8.6 mg at 06/24/17 7494  . tamsulosin (FLOMAX) capsule 0.4 mg  0.4 mg Oral QHS Stasia Cavalier, MD   0.4 mg at 06/23/17 2047  . traMADol (ULTRAM) tablet 50-100 mg  50-100 mg Oral Q6H PRN Ardis Hughs, MD      . zolpidem [facility]) tablet 5 mg  5 mg Oral QHS PRN Ardis Hughs, MD   5 mg at 06/24/17 0045     Objective: Vital: Vitals:   06/23/17 1400 06/23/17 2045 06/24/17 0448 06/24/17 0836  BP: 114/65 127/72 97/62 135/81  Pulse: (!) 59 62 73 69  Resp:  17 16 18   Temp: 98.3 F (36.8 C) 98.2 F (36.8 C) 98 F (36.7 C) 98.2 F (36.8 C)  TempSrc: Oral Oral Oral Oral  SpO2: 97% 99% 97% 95%  Weight:      Height:       I/Os: I/O last 3 completed shifts:  In: 3545.8 [P.O.:600; I.V.:2845.8; IV Piggyback:100] Out: 4300 [Urine:4300]  Physical Exam:  General: Patient is in no apparent distress Lungs: Normal respiratory effort, chest expands symmetrically. GI:The abdomen is soft and nontender without mass. Foley: clear urine  Ext: lower extremities symmetric  Lab Results: Recent Labs    06/22/17 1204 06/23/17 0521 06/24/17 0541  WBC 17.3* 14.8* 17.3*  HGB 10.0* 9.8* 10.2*  HCT 29.1* 28.8* 29.9*   Recent Labs    06/22/17 1204 06/23/17 0521 06/24/17 0541  NA 129* 135 135  K 3.8 4.2 3.9  CL 95* 102 106  CO2 23 20* 21*  GLUCOSE 150* 150* 116*  BUN 42* 44* 40*  CREATININE 3.21* 2.54* 2.10*  CALCIUM 8.8* 8.5* 8.5*   No results for input(s): LABPT, INR in the last 72 hours. No results for input(s): LABURIN in the last 72 hours. Results for orders placed or performed during the hospital encounter of  06/22/17  Culture, blood (Routine X 2) w Reflex to ID Panel     Status: None (Preliminary result)   Collection Time: 06/22/17  3:25 PM  Result Value Ref Range Status   Specimen Description   Final    BLOOD LEFT ANTECUBITAL Performed at [facility] [address], [place], [place] [address]    Special Requests   Final    BOTTLES DRAWN AEROBIC AND ANAEROBIC Blood Culture adequate volume Performed at [facility] [address], [place], [place] [address]    Culture  Setup Time   Final    GRAM NEGATIVE RODS IN BOTH AEROBIC AND ANAEROBIC BOTTLES CRITICAL RESULT CALLED TO, READ BACK BY AND VERIFIED WITH: E. JACKSON, RPHARMD AT 9622 ON 06/23/17 BY C. JESSUP, MLT. Performed at [facility], [place] [address] [place], [place] [address]    Culture GRAM NEGATIVE RODS  Final   Report Status PENDING  Incomplete  Blood Culture ID Panel (Reflexed)     Status: Abnormal   Collection Time: 06/22/17  3:25 PM  Result Value Ref Range Status   Enterococcus species NOT DETECTED NOT DETECTED Final   Listeria monocytogenes NOT DETECTED NOT DETECTED Final   Staphylococcus species NOT DETECTED NOT DETECTED Final   Staphylococcus aureus NOT DETECTED NOT DETECTED Final   Streptococcus species NOT DETECTED NOT DETECTED Final   Streptococcus agalactiae NOT DETECTED NOT DETECTED Final   Streptococcus pneumoniae NOT DETECTED NOT DETECTED Final   Streptococcus pyogenes NOT DETECTED NOT DETECTED Final   Acinetobacter baumannii NOT DETECTED NOT DETECTED Final   Enterobacteriaceae species DETECTED (A) NOT DETECTED Final    Comment: Enterobacteriaceae represent a large family of gram-negative bacteria, not a single organism. CRITICAL RESULT CALLED TO, READ BACK BY AND VERIFIED WITH: E. JACKSON, RPHARMD AT 9211 ON 06/23/17 BY C. JESSUP, MLT.    Enterobacter cloacae complex NOT DETECTED NOT DETECTED Final   Escherichia coli DETECTED (A) NOT DETECTED Final    Comment:  CRITICAL RESULT CALLED TO, READ BACK BY AND VERIFIED WITH: E. JACKSON, RPHARMD AT 1015 ON 06/23/17 BY C. JESSUP, MLT.    Klebsiella oxytoca NOT DETECTED NOT DETECTED Final   Klebsiella pneumoniae NOT DETECTED NOT DETECTED Final   Proteus species NOT DETECTED NOT DETECTED Final   Serratia marcescens NOT DETECTED NOT DETECTED Final   Carbapenem resistance NOT DETECTED NOT DETECTED Final   Haemophilus influenzae NOT DETECTED NOT DETECTED Final   Neisseria meningitidis NOT DETECTED NOT DETECTED Final   Pseudomonas aeruginosa NOT DETECTED NOT DETECTED Final   Candida  albicans NOT DETECTED NOT DETECTED Final   Candida glabrata NOT DETECTED NOT DETECTED Final   Candida krusei NOT DETECTED NOT DETECTED Final   Candida parapsilosis NOT DETECTED NOT DETECTED Final   Candida tropicalis NOT DETECTED NOT DETECTED Final    Comment: Performed at [facility], [place] [address], [place], [place] [address]  Culture, blood (Routine X 2) w Reflex to ID Panel     Status: None (Preliminary result)   Collection Time: 06/22/17  7:20 PM  Result Value Ref Range Status   Specimen Description   Final    BLOOD BLOOD RIGHT ARM Performed at [facility] [address] [place], [place] [address]    Special Requests   Final    BOTTLES DRAWN AEROBIC AND ANAEROBIC Blood Culture adequate volume Performed at [facility] [address], [place], [place] [address]    Culture   Final    NO GROWTH < 24 HOURS Performed at [facility] [address], [place],  [address]    Report Status PENDING  Incomplete    Studies/Results: Dg C-arm 1-60 Min-no Report  Result Date: 06/22/2017 Fluoroscopy was utilized by the requesting physician.  No radiographic interpretation.   Ct Renal Stone Study  Result Date: 06/22/2017 CLINICAL DATA:  Flank pain EXAM: CT ABDOMEN AND PELVIS WITHOUT CONTRAST TECHNIQUE: Multidetector CT imaging of the abdomen and pelvis was performed  following the standard protocol without IV contrast. COMPARISON:  None. FINDINGS: Lower chest: No basilar pulmonary nodules or pleural effusion. No apical pericardial effusion. Hepatobiliary: Normal hepatic contours and density. No visible biliary dilatation. Normal gallbladder. Pancreas: Normal parenchymal contours without ductal dilatation. No peripancreatic fluid collection. Spleen: Normal. Adrenals/Urinary Tract: --Adrenal glands: Normal. --Right kidney/ureter: Large right renal cyst measures 8.2 cm. There is mild right hydroureteronephrosis with moderate perinephric stranding. There is a stone within the distal ureter measuring 5 mm. No other right nephroureterolithiasis. --Left kidney/ureter: Multiple renal cysts measuring up to 6.2 cm. Mild left hydroureteronephrosis without obstructing lesion identified. --Urinary bladder: Hyperdense appearance of the bladder. There is a Foley catheter within. Stomach/Bowel: --Stomach/Duodenum: No hiatal hernia or other gastric abnormality. Normal duodenal course. --Small bowel: No dilatation or inflammation. --Colon: No focal abnormality. --Appendix: Normal. Vascular/Lymphatic: Atherosclerotic calcification is present within the non-aneurysmal abdominal aorta, without hemodynamically significant stenosis. No abdominal or pelvic lymphadenopathy. Reproductive: Prostate is enlarged measuring 6.0 cm in transverse dimension. Musculoskeletal. No bony spinal canal stenosis or focal osseous abnormality. Other: None. IMPRESSION: 1. Right-sided obstructive uropathy with 5 mm distal ureteral stone causing mild hydroureteronephrosis and moderate perinephric stranding. 2. Mild left hydronephrosis without obstructing ureteral lesion. 3. Hyperdense appearance of the urinary bladder contents may indicate blood products or neoplasm. Consider correlation with cystoscopy. 4. Enlarged prostate. Electronically Signed   By: Lucas Hardy M.D.   On: 06/22/2017 15:07   [place] Ekg Site Rite  Result  Date: 06/23/2017 If Site Rite image not attached, placement could not be confirmed due to current cardiac rhythm.   Assessment: Procedure(s): CYSTOSCOPY WITH RIGHT URETERAL STENT PLACEMENT, 2 Days Post-Op  doing well. Bacteremia treated with 5 days of ertapenem followed by 10 days of cipro. Urinary retention managed with catheter Acute on chronic renal failure improving.  Plan: Placement of midline catheter Home this afternoon with catheter.   Lucas Meckel, MD Urology 06/24/2017, 9:08 AM

## 2017-06-24 NOTE — Discharge Summary (Signed)
Date of admission: 06/22/2017  Date of discharge: 06/24/2017  Admission diagnosis: bacteremia, right hydro  Discharge diagnosis: same, acute on chronic renal failure  Secondary diagnoses:  Patient Active Problem List   Diagnosis Date Noted  . UTI (urinary tract infection) 06/22/2017  . Chest pain 05/30/2017  . Depression 05/12/2013  . Insomnia 05/12/2013  . RLS (restless legs syndrome) 05/12/2013  . BPH (benign prostatic hyperplasia) 05/12/2013  . Mixed hyperlipidemia 05/12/2013    Procedures performed: Procedure(s): CYSTOSCOPY WITH RIGHT URETERAL STENT PLACEMENT  History and Physical: For full details, please see admission history and physical. Briefly, Lucas Hardy is a 72 y.o. year old patient with urinary retention that developed pyelonephritis. Initial abx were ineffective and patient declined.  Presented to the ED with fevers and malaise.  Noted to have hydro with question of stone in mid-ureter.   Hospital Course: Patient tolerated the procedure well.  He was then transferred to the floor after an uneventful PACU stay.  His hospital course was uncomplicated.  Blood culture was positive for ESBL ecoli and enterobacter which was consistent with his urine culture obtained several days prior in clinic.  He was started on Ertapenem and a midline IV was placed.  He was setup for IV abx at home for a total of 5 days and then cipro x 10 days.  He was given IV protonix for indigestion and GERD.  He is being discharged with his catheter.    On POD#2  he had met discharge criteria: was eating a regular diet, was up and ambulating independently,  pain was well controlled, and was ready to for discharge.   Laboratory values:  Recent Labs    06/22/17 1204 06/23/17 0521 06/24/17 0541  WBC 17.3* 14.8* 17.3*  HGB 10.0* 9.8* 10.2*  HCT 29.1* 28.8* 29.9*   Recent Labs    06/22/17 1204 06/23/17 0521 06/24/17 0541  NA 129* 135 135  K 3.8 4.2 3.9  CL 95* 102 106  CO2 23 20* 21*   GLUCOSE 150* 150* 116*  BUN 42* 44* 40*  CREATININE 3.21* 2.54* 2.10*  CALCIUM 8.8* 8.5* 8.5*   No results for input(s): LABPT, INR in the last 72 hours. No results for input(s): LABURIN in the last 72 hours. Results for orders placed or performed during the hospital encounter of 06/22/17  Culture, blood (Routine X 2) w Reflex to ID Panel     Status: Abnormal (Preliminary result)   Collection Time: 06/22/17  3:25 PM  Result Value Ref Range Status   Specimen Description   Final    BLOOD LEFT ANTECUBITAL Performed at [facility] [address] [place], [place] [address]    Special Requests   Final    BOTTLES DRAWN AEROBIC AND ANAEROBIC Blood Culture adequate volume Performed at [facility] [address]  [place], [place] [address]    Culture  Setup Time   Final    GRAM NEGATIVE RODS IN BOTH AEROBIC AND ANAEROBIC BOTTLES CRITICAL RESULT CALLED TO, READ BACK BY AND VERIFIED WITH: E. JACKSON, RPHARMD AT 2831 ON 06/23/17 BY C. JESSUP, MLT.    Culture (A)  Final    ESCHERICHIA COLI SUSCEPTIBILITIES TO FOLLOW Performed at [facility], [place] [address], [place],  [address]    Report Status PENDING  Incomplete  Blood Culture ID Panel (Reflexed)     Status: Abnormal   Collection Time: 06/22/17  3:25 PM  Result Value Ref Range Status   Enterococcus species  NOT DETECTED NOT DETECTED Final   Listeria monocytogenes NOT DETECTED NOT DETECTED Final   Staphylococcus species NOT DETECTED NOT DETECTED Final   Staphylococcus aureus NOT DETECTED NOT DETECTED Final   Streptococcus species NOT DETECTED NOT DETECTED Final   Streptococcus agalactiae NOT DETECTED NOT DETECTED Final   Streptococcus pneumoniae NOT DETECTED NOT DETECTED Final   Streptococcus pyogenes NOT DETECTED NOT DETECTED Final   Acinetobacter baumannii NOT DETECTED NOT DETECTED Final   Enterobacteriaceae species DETECTED (A) NOT DETECTED Final    Comment: Enterobacteriaceae  represent a large family of gram-negative bacteria, not a single organism. CRITICAL RESULT CALLED TO, READ BACK BY AND VERIFIED WITH: E. JACKSON, RPHARMD AT 4854 ON 06/23/17 BY C. JESSUP, MLT.    Enterobacter cloacae complex NOT DETECTED NOT DETECTED Final   Escherichia coli DETECTED (A) NOT DETECTED Final    Comment: CRITICAL RESULT CALLED TO, READ BACK BY AND VERIFIED WITH: E. JACKSON, RPHARMD AT 1015 ON 06/23/17 BY C. JESSUP, MLT.    Klebsiella oxytoca NOT DETECTED NOT DETECTED Final   Klebsiella pneumoniae NOT DETECTED NOT DETECTED Final   Proteus species NOT DETECTED NOT DETECTED Final   Serratia marcescens NOT DETECTED NOT DETECTED Final   Carbapenem resistance NOT DETECTED NOT DETECTED Final   Haemophilus influenzae NOT DETECTED NOT DETECTED Final   Neisseria meningitidis NOT DETECTED NOT DETECTED Final   Pseudomonas aeruginosa NOT DETECTED NOT DETECTED Final   Candida albicans NOT DETECTED NOT DETECTED Final   Candida glabrata NOT DETECTED NOT DETECTED Final   Candida krusei NOT DETECTED NOT DETECTED Final   Candida parapsilosis NOT DETECTED NOT DETECTED Final   Candida tropicalis NOT DETECTED NOT DETECTED Final    Comment: Performed at [facility], [place] [address], [place], [place] [address]  Culture, blood (Routine X 2) w Reflex to ID Panel     Status: None (Preliminary result)   Collection Time: 06/22/17  7:20 PM  Result Value Ref Range Status   Specimen Description   Final    BLOOD BLOOD RIGHT ARM Performed at [facility] [address], [place], [place] [address]    Special Requests   Final    BOTTLES DRAWN AEROBIC AND ANAEROBIC Blood Culture adequate volume Performed at [facility] [address], [place], [place] [address]    Culture   Final    NO GROWTH < 24 HOURS Performed at [facility] [address] [place], [place] [address]    Report Status PENDING  Incomplete    Disposition: Home  Discharge  instruction: The patient was instructed to be ambulatory but told to refrain from heavy lifting, strenuous activity, or driving.   Discharge medications:  Allergies as of 06/24/2017      Reactions   Effexor [venlafaxine]    Anxiety      Medication List    STOP taking these medications   ibuprofen 200 MG tablet Commonly known as:  ADVIL,MOTRIN   sulfamethoxazole-trimethoprim 800-160 MG tablet Commonly known as:  BACTRIM DS,SEPTRA DS     TAKE these medications   acetaminophen 500 MG tablet Commonly known as:  TYLENOL Take 1,000 mg by mouth daily as needed for fever.   amLODipine 5 MG tablet Commonly known as:  NORVASC Take 5 mg by mouth daily.   aspirin 81 MG tablet Take 81 mg by mouth daily.   Cholecalciferol 2000 units Caps Take 2,000 Units by mouth daily.   ciprofloxacin 500 MG tablet Commonly known as:  CIPRO Take 1 tablet (500 mg total) by mouth 2 (two) times daily for 10 days.   ertapenem IVPB Commonly known as:  INVANZ Inject 1 g into the vein daily for 3 days. Indication:  ESBL Ecoli Urosepsis Last Day of Therapy:  06/27/17 Labs - Once weekly:  CBC/D and BMP, Labs - Every other week:  ESR and CRP Start taking on:  06/25/2017   FLUoxetine 20 MG capsule Commonly known as:  PROZAC Take 20 mg by mouth daily.   metoprolol succinate 25 MG 24 hr tablet Commonly known as:  TOPROL XL Take 0.5 tablets (12.5 mg total) by mouth daily.   multivitamin with minerals Tabs tablet Take 1 tablet by mouth daily.   neomycin-polymyxin-pramoxine 1 % cream Commonly known as:  NEOSPORIN PLUS Apply 1 application topically daily. APPLY TO UPPER LIP AND CATHETER   ondansetron 4 MG tablet Commonly known as:  ZOFRAN Take 4 mg by mouth every 8 (eight) hours as needed.   rosuvastatin 20 MG tablet Commonly known as:  CRESTOR Take 20 mg by mouth daily.   tamsulosin 0.4 MG Caps capsule Commonly known as:  FLOMAX Take 0.4 mg by mouth daily. What changed:  Another medication  with the same name was added. Make sure you understand how and when to take each.   tamsulosin 0.4 MG Caps capsule Commonly known as:  FLOMAX Take 1 capsule (0.4 mg total) by mouth at bedtime. What changed:  You were already taking a medication with the same name, and this prescription was added. Make sure you understand how and when to take each.            Home Infusion Instuctions  (From admission, onward)        Start     Ordered   06/24/17 0000  Home infusion instructions [facility] May follow [facility] Dosing Protocol; May administer Cathflo as needed to maintain patency of vascular access device.; Flushing of vascular access device: per [facility] Protocol: 0.9% NaCl pre/post medica...    Question Answer Comment  Instructions May follow [facility] Dosing Protocol   Instructions May administer Cathflo as needed to maintain patency of vascular access device.   Instructions Flushing of vascular access device: per [facility] Protocol: 0.9% NaCl pre/post medication administration and prn patency; Heparin 100 u/ml, 11m for implanted ports and Heparin 10u/ml, 546mfor all other central venous catheters.   Instructions May follow [facility] Anaphylaxis Protocol for First Dose Administration in the home: 0.9% NaCl at 25-50 ml/hr to maintain IV access for protocol meds. Epinephrine 0.3 ml IV/IM PRN and Benadryl 25-50 IV/IM PRN s/s of anaphylaxis.   Instructions [facility] Infusion Coordinator (RN) to assist per patient IV care needs in the home PRN.      06/24/17 0900      Followup:  Follow-up Information    HeArdis HughsMD Follow up on 07/07/2017.   Specialty:  Urology Why:  for urodynamics Contact information: [address] [phone]

## 2017-06-24 NOTE — Care Management Note (Signed)
Case Management Note  Patient Details  Name: Lucas Hardy MRN: [id number] Date of Birth: 05/27/1945  Subjective/Objective: [facility] iv infusion rep Santiago Glad following. No further CM needs.                   Action/Plan:d/c home w/[facility]HC/iv infusion-supplies.   Expected Discharge Date:  06/24/17               Expected Discharge Plan:  [place]  In-House Referral:     Discharge planning Services  CM Consult  Post Acute Care Choice:    Choice offered to:  Patient  DME Arranged:    DME Agency:     HH Arranged:  IV Antibiotics HH Agency:  [facility]  Status of Service:  Completed, signed off  If discussed at [place] of Stay Meetings, dates discussed:    Additional Comments:  Dessa Phi, RN 06/24/2017, 10:14 AM

## 2017-06-24 NOTE — Discharge Instructions (Signed)
DISCHARGE INSTRUCTIONS FOR KIDNEY STONE/URETERAL STENT   MEDICATIONS:  1. Resume all your other meds from home - except do not take any extra narcotic pain meds that you may have at home.  2. Tramadol is for moderate/severe pain, otherwise taking upto 1000 mg every 6 hours of plainTylenol will help treat your pain.   3. Complete IV abx until Monday, then start Cipro for 10 days.  ACTIVITY:  1. No strenuous activity x 1week  2. No driving while on narcotic pain medications  3. Drink plenty of water  4. Continue to walk at home - you can still get blood clots when you are at home, so keep active, but don't over do it.  5. May return to work/school tomorrow or when you feel ready   BATHING:  1. You can shower and we recommend daily showers  2. You have a string coming from your urethra: The stent string is attached to your ureteral stent. Do not pull on this.   SIGNS/SYMPTOMS TO CALL:  Please call us if you have a fever greater than 101.5, uncontrolled nausea/vomiting, uncontrolled pain, dizziness, unable to urinate, bloody urine, chest pain, shortness of breath, leg swelling, leg pain, redness around wound, drainage from wound, or any other concerns or questions.   You can reach [place] at [phone].   FOLLOW-UP:  1. You have an appointment in 2 weeks for urodynamics.

## 2017-06-25 DIAGNOSIS — N390 Urinary tract infection, site not specified: Secondary | ICD-10-CM | POA: Diagnosis not present

## 2017-06-25 LAB — CULTURE, BLOOD (ROUTINE X 2): Special Requests: ADEQUATE

## 2017-06-27 DIAGNOSIS — N390 Urinary tract infection, site not specified: Secondary | ICD-10-CM | POA: Diagnosis not present

## 2017-06-28 DIAGNOSIS — N1330 Unspecified hydronephrosis: Secondary | ICD-10-CM | POA: Diagnosis not present

## 2017-06-28 DIAGNOSIS — B001 Herpesviral vesicular dermatitis: Secondary | ICD-10-CM | POA: Diagnosis not present

## 2017-06-28 DIAGNOSIS — N183 Chronic kidney disease, stage 3 (moderate): Secondary | ICD-10-CM | POA: Diagnosis not present

## 2017-06-28 LAB — CULTURE, BLOOD (ROUTINE X 2)
CULTURE: NO GROWTH
CULTURE: NO GROWTH
Special Requests: ADEQUATE

## 2017-06-29 LAB — CULTURE, BLOOD (ROUTINE X 2): Special Requests: ADEQUATE

## 2017-06-30 DIAGNOSIS — R339 Retention of urine, unspecified: Secondary | ICD-10-CM | POA: Diagnosis not present

## 2017-06-30 DIAGNOSIS — N10 Acute tubulo-interstitial nephritis: Secondary | ICD-10-CM | POA: Diagnosis not present

## 2017-07-01 IMAGING — CT CT CHEST W/O CM
3 of 4 series · 17 of 30 positions shown, 19 images · non-contrast
Comparison: 05/17/2013

CLINICAL DATA: Followup left pulmonary nodule

EXAM:
CT CHEST WITHOUT CONTRAST
TECHNIQUE: Multidetector CT imaging of the chest was performed following the
standard protocol without IV contrast.

[Series 3: chest w/o · axial · non-contrast · 0.79mm/px · z∈[-252,-28]mm · 6 of 127 slices shown]
[im 19/127  lung]
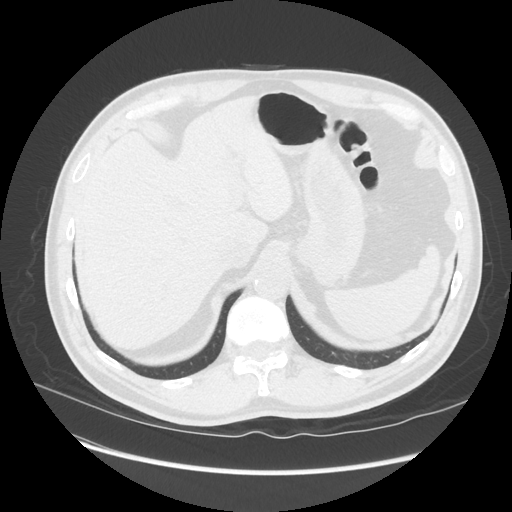
[im 37/127  lung]
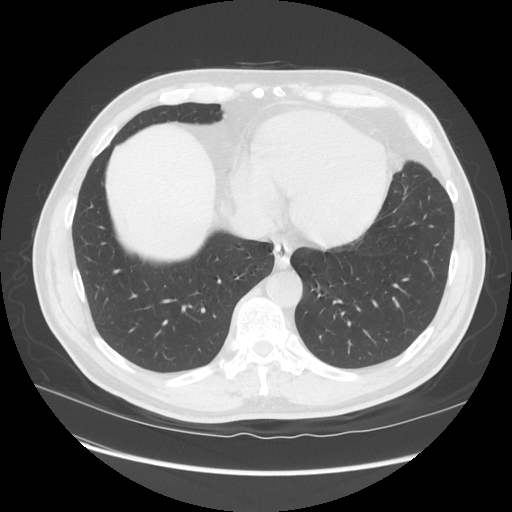
[im 55/127  lung]
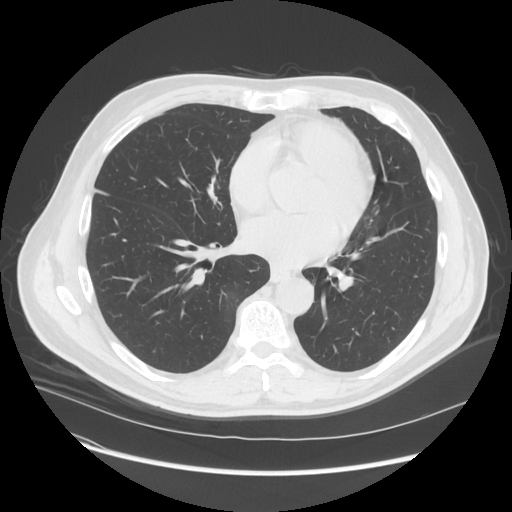
[im 73/127  lung]
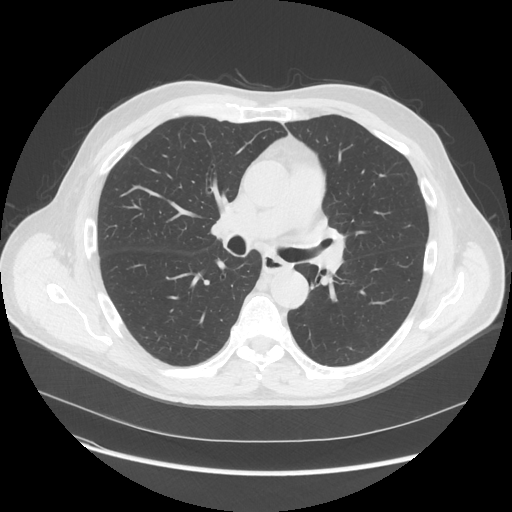
[im 91/127  lung]
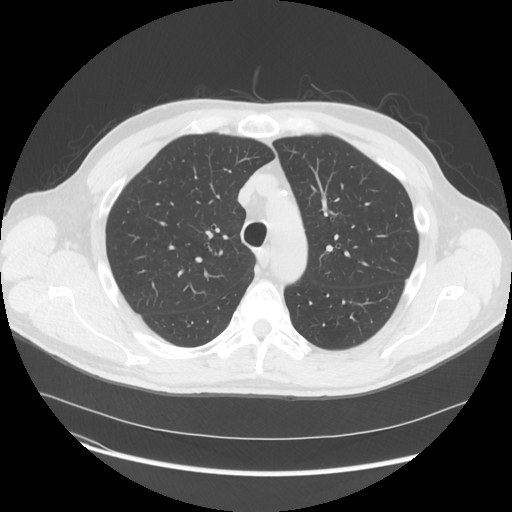
[im 109/127  lung]
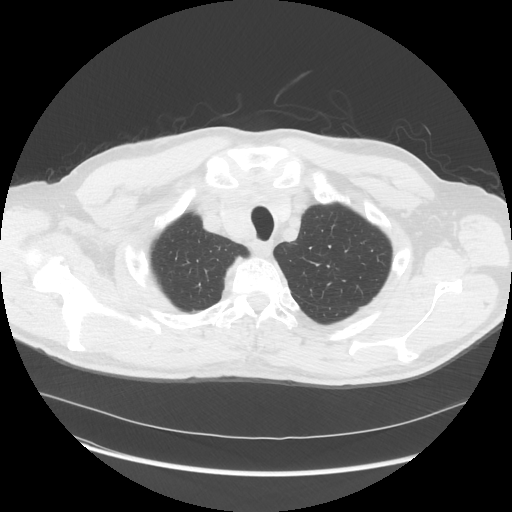

[Series 4: lung windows · axial · 0.79mm/px · z∈[-260,-22]mm · 7 of 127 slices shown, 9 images]
[im 16/127  mediastinal]
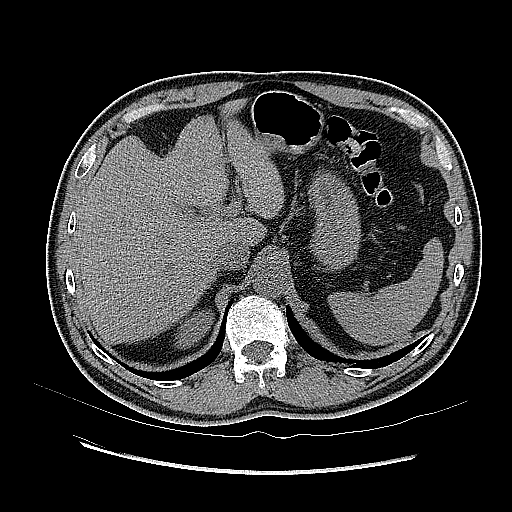
[im 16/127  lung]
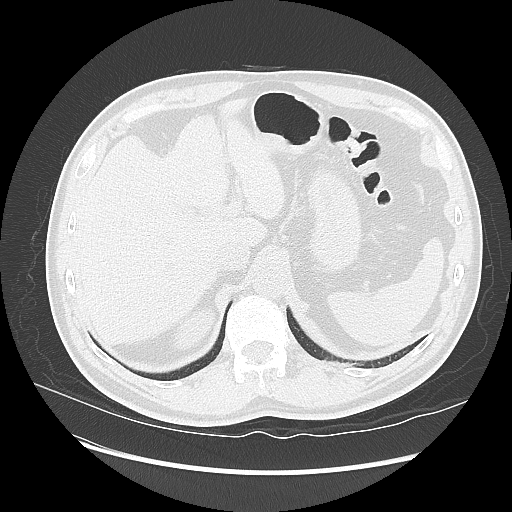
[im 32/127  lung]
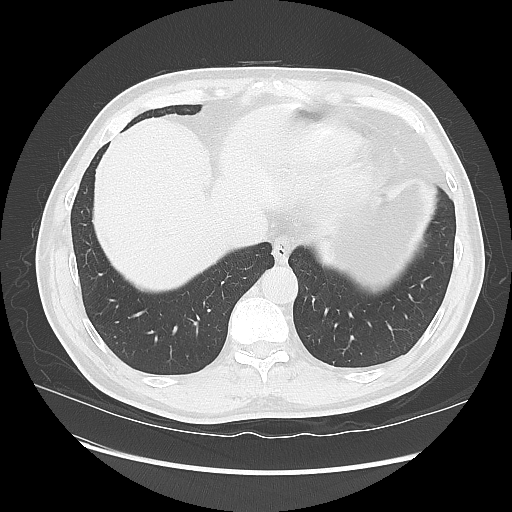
[im 48/127  lung]
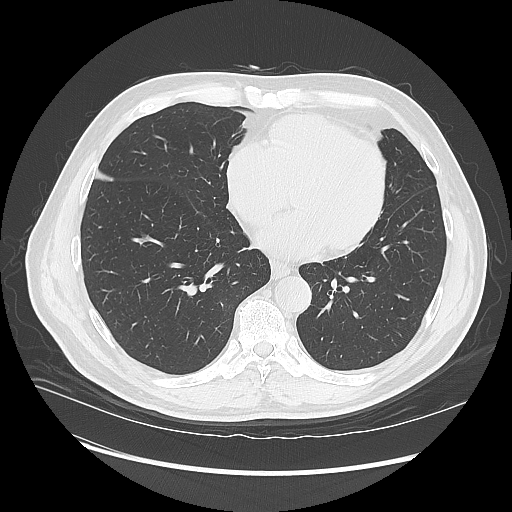
[im 64/127  lung]
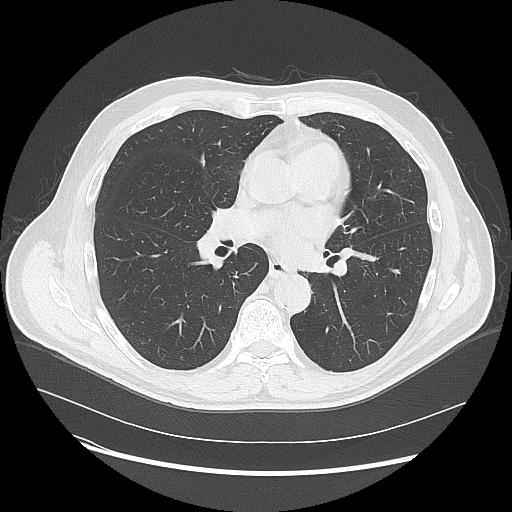
[im 79/127  mediastinal]
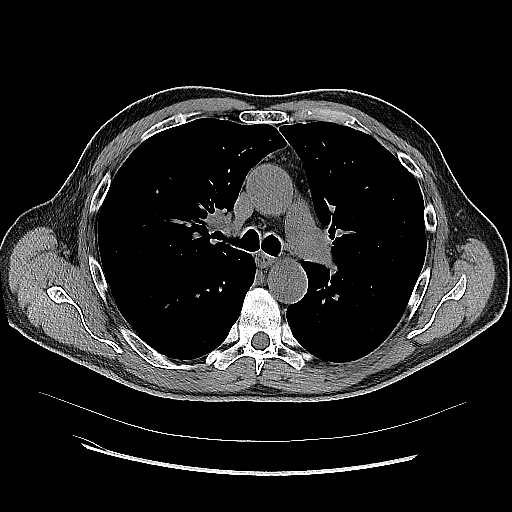
[im 79/127  lung]
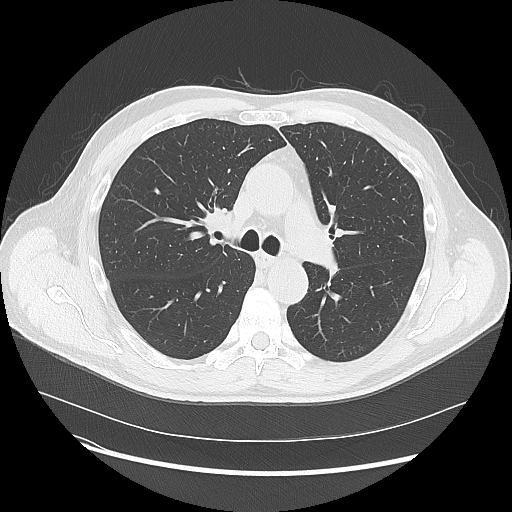
[im 95/127  lung]
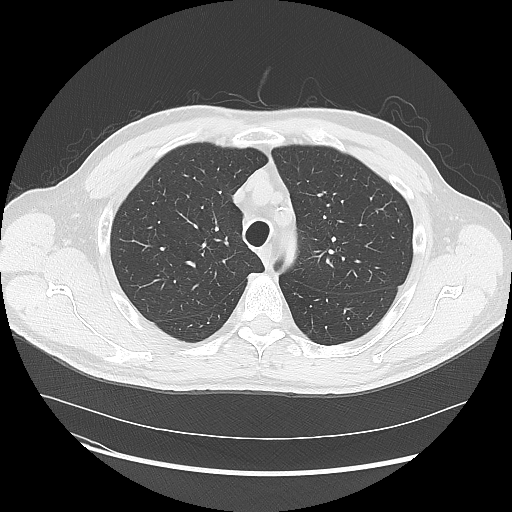
[im 111/127  lung]
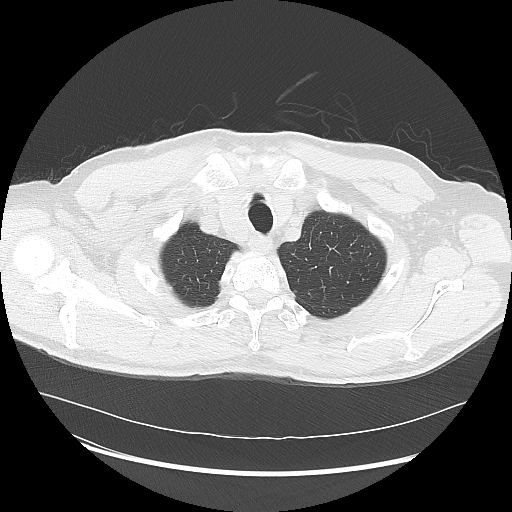

[Series 602: sagittal body · sagittal · 0.79mm/px · 4 of 163 slices shown]
[im 17/163  mediastinal]
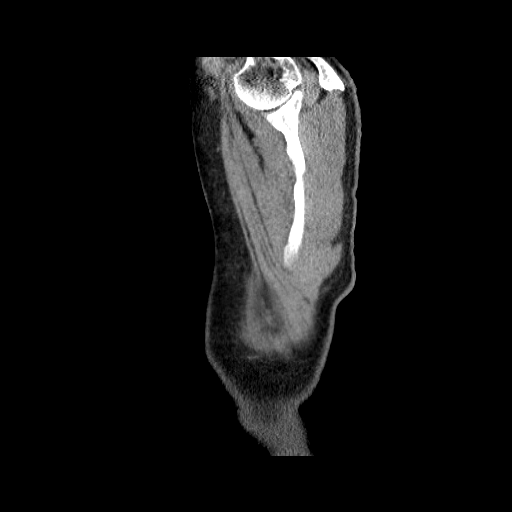
[im 33/163  mediastinal]
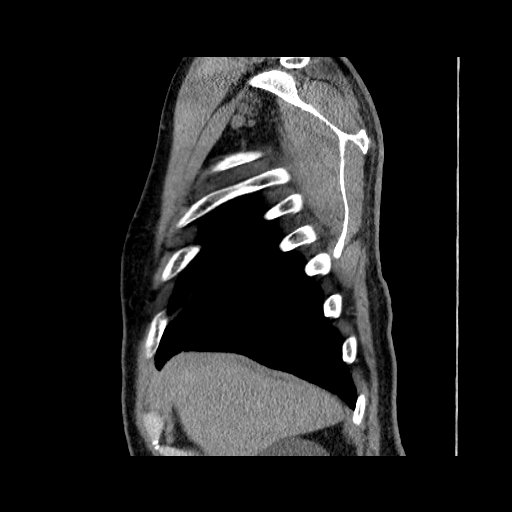
[im 49/163  mediastinal]
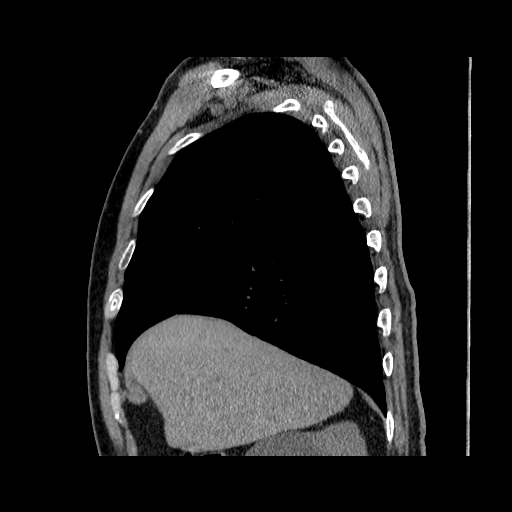
[im 65/163  mediastinal]
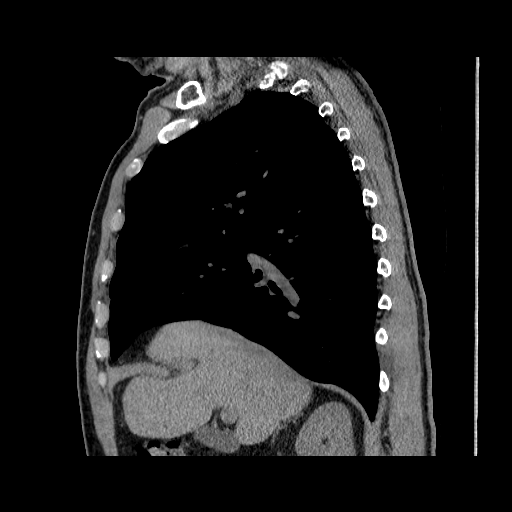

[17 of 30 positions shown; findings below may reference images not displayed]

FINDINGS: Cardiovascular: Heart is normal size. Aorta is normal caliber.
Scattered coronary artery calcifications in the left anterior
descending coronary artery. Scattered aortic calcifications.

Mediastinum/Nodes: No mediastinal, hilar, or axillary adenopathy.
Trachea and esophagus unremarkable.

Lungs/Pleura: Nodule noted at the left lung base on image 86
measures up to 6.8 mm compared to 6.5 mm previously, not
significantly changed. No new pulmonary nodules or effusions. No
confluent airspace opacities.

Upper Abdomen: Imaging into the upper abdomen shows no acute
findings.

Musculoskeletal: Chest wall soft tissues are unremarkable. No acute
bony abnormality.
IMPRESSION: Stable approximately 7 mm nodule in the left lung base, stable for
over 3 years compatible with a benign process. No additional
follow-up necessary.

Scattered coronary artery and aortic calcifications.

## 2017-07-04 DIAGNOSIS — R338 Other retention of urine: Secondary | ICD-10-CM | POA: Diagnosis not present

## 2017-07-25 ENCOUNTER — Other Ambulatory Visit: Payer: Self-pay | Admitting: Urology

## 2017-07-25 DIAGNOSIS — R339 Retention of urine, unspecified: Secondary | ICD-10-CM | POA: Diagnosis not present

## 2017-07-27 DIAGNOSIS — N3000 Acute cystitis without hematuria: Secondary | ICD-10-CM | POA: Diagnosis not present

## 2017-07-29 ENCOUNTER — Other Ambulatory Visit: Payer: Self-pay

## 2017-07-29 ENCOUNTER — Inpatient Hospital Stay (HOSPITAL_COMMUNITY)
Admission: EM | Admit: 2017-07-29 | Discharge: 2017-08-02 | DRG: 699 | Disposition: A | Discharge status: Home / Self Care | Payer: PPO | Attending: Urology | Admitting: Urology

## 2017-07-29 ENCOUNTER — Encounter (HOSPITAL_COMMUNITY): Payer: Self-pay | Admitting: *Deleted

## 2017-07-29 DIAGNOSIS — F329 Major depressive disorder, single episode, unspecified: Secondary | ICD-10-CM | POA: Diagnosis present

## 2017-07-29 DIAGNOSIS — T83511A Infection and inflammatory reaction due to indwelling urethral catheter, initial encounter: Principal | ICD-10-CM | POA: Diagnosis present

## 2017-07-29 DIAGNOSIS — Z7982 Long term (current) use of aspirin: Secondary | ICD-10-CM | POA: Diagnosis not present

## 2017-07-29 DIAGNOSIS — R338 Other retention of urine: Secondary | ICD-10-CM | POA: Diagnosis present

## 2017-07-29 DIAGNOSIS — E440 Moderate protein-calorie malnutrition: Secondary | ICD-10-CM | POA: Diagnosis not present

## 2017-07-29 DIAGNOSIS — A419 Sepsis, unspecified organism: Secondary | ICD-10-CM

## 2017-07-29 DIAGNOSIS — R509 Fever, unspecified: Secondary | ICD-10-CM

## 2017-07-29 DIAGNOSIS — Y846 Urinary catheterization as the cause of abnormal reaction of the patient, or of later complication, without mention of misadventure at the time of the procedure: Secondary | ICD-10-CM | POA: Diagnosis present

## 2017-07-29 DIAGNOSIS — Z87440 Personal history of urinary (tract) infections: Secondary | ICD-10-CM | POA: Diagnosis not present

## 2017-07-29 DIAGNOSIS — N400 Enlarged prostate without lower urinary tract symptoms: Secondary | ICD-10-CM | POA: Diagnosis not present

## 2017-07-29 DIAGNOSIS — Z1630 Resistance to unspecified antimicrobial drugs: Secondary | ICD-10-CM | POA: Diagnosis not present

## 2017-07-29 DIAGNOSIS — N12 Tubulo-interstitial nephritis, not specified as acute or chronic: Secondary | ICD-10-CM | POA: Diagnosis present

## 2017-07-29 DIAGNOSIS — Z87891 Personal history of nicotine dependence: Secondary | ICD-10-CM | POA: Diagnosis not present

## 2017-07-29 DIAGNOSIS — N138 Other obstructive and reflux uropathy: Secondary | ICD-10-CM | POA: Diagnosis present

## 2017-07-29 DIAGNOSIS — Z888 Allergy status to other drugs, medicaments and biological substances status: Secondary | ICD-10-CM | POA: Diagnosis not present

## 2017-07-29 DIAGNOSIS — Z6824 Body mass index (BMI) 24.0-24.9, adult: Secondary | ICD-10-CM

## 2017-07-29 DIAGNOSIS — Z8249 Family history of ischemic heart disease and other diseases of the circulatory system: Secondary | ICD-10-CM | POA: Diagnosis not present

## 2017-07-29 DIAGNOSIS — Z79899 Other long term (current) drug therapy: Secondary | ICD-10-CM | POA: Diagnosis not present

## 2017-07-29 DIAGNOSIS — E559 Vitamin D deficiency, unspecified: Secondary | ICD-10-CM | POA: Diagnosis present

## 2017-07-29 DIAGNOSIS — K219 Gastro-esophageal reflux disease without esophagitis: Secondary | ICD-10-CM | POA: Diagnosis present

## 2017-07-29 DIAGNOSIS — N390 Urinary tract infection, site not specified: Secondary | ICD-10-CM

## 2017-07-29 DIAGNOSIS — Z87448 Personal history of other diseases of urinary system: Secondary | ICD-10-CM

## 2017-07-29 DIAGNOSIS — R112 Nausea with vomiting, unspecified: Secondary | ICD-10-CM | POA: Diagnosis not present

## 2017-07-29 DIAGNOSIS — I129 Hypertensive chronic kidney disease with stage 1 through stage 4 chronic kidney disease, or unspecified chronic kidney disease: Secondary | ICD-10-CM | POA: Diagnosis present

## 2017-07-29 DIAGNOSIS — N401 Enlarged prostate with lower urinary tract symptoms: Secondary | ICD-10-CM | POA: Diagnosis present

## 2017-07-29 DIAGNOSIS — Z881 Allergy status to other antibiotic agents status: Secondary | ICD-10-CM

## 2017-07-29 DIAGNOSIS — N184 Chronic kidney disease, stage 4 (severe): Secondary | ICD-10-CM | POA: Diagnosis present

## 2017-07-29 DIAGNOSIS — E782 Mixed hyperlipidemia: Secondary | ICD-10-CM | POA: Diagnosis present

## 2017-07-29 HISTORY — DX: Personal history of other diseases of urinary system: Z87.448

## 2017-07-29 LAB — CBC
HCT: 31.9 % — ABNORMAL LOW (ref 39.0–52.0)
HEMOGLOBIN: 10.4 g/dL — AB (ref 13.0–17.0)
MCH: 29.6 pg (ref 26.0–34.0)
MCHC: 32.6 g/dL (ref 30.0–36.0)
MCV: 90.9 fL (ref 78.0–100.0)
PLATELETS: 229 10*3/uL (ref 150–400)
RBC: 3.51 MIL/uL — ABNORMAL LOW (ref 4.22–5.81)
RDW: 13.5 % (ref 11.5–15.5)
WBC: 23.4 10*3/uL — ABNORMAL HIGH (ref 4.0–10.5)

## 2017-07-29 LAB — COMPREHENSIVE METABOLIC PANEL
ALK PHOS: 69 U/L (ref 38–126)
ALT: 19 U/L (ref 17–63)
AST: 20 U/L (ref 15–41)
Albumin: 3.5 g/dL (ref 3.5–5.0)
Anion gap: 10 (ref 5–15)
BUN: 20 mg/dL (ref 6–20)
CALCIUM: 9.2 mg/dL (ref 8.9–10.3)
CO2: 21 mmol/L — AB (ref 22–32)
Chloride: 103 mmol/L (ref 101–111)
Creatinine, Ser: 1.44 mg/dL — ABNORMAL HIGH (ref 0.61–1.24)
GFR calc Af Amer: 55 mL/min — ABNORMAL LOW (ref >60)
GFR calc non Af Amer: 47 mL/min — ABNORMAL LOW (ref >60)
GLUCOSE: 141 mg/dL — AB (ref 65–99)
Potassium: 3.9 mmol/L (ref 3.5–5.1)
SODIUM: 134 mmol/L — AB (ref 135–145)
Total Bilirubin: 1 mg/dL (ref 0.3–1.2)
Total Protein: 7 g/dL (ref 6.5–8.1)

## 2017-07-29 LAB — URINALYSIS, ROUTINE W REFLEX MICROSCOPIC
Bacteria, UA: NONE SEEN
Bilirubin Urine: NEGATIVE
Glucose, UA: NEGATIVE mg/dL
Ketones, ur: 20 mg/dL — AB
Nitrite: NEGATIVE
PROTEIN: 30 mg/dL — AB
Specific Gravity, Urine: 1.016 (ref 1.005–1.030)
pH: 6 (ref 5.0–8.0)

## 2017-07-29 LAB — I-STAT CG4 LACTIC ACID, ED: LACTIC ACID, VENOUS: 0.8 mmol/L (ref 0.5–1.9)

## 2017-07-29 MED ORDER — ONDANSETRON HCL 4 MG/2ML IJ SOLN
4.0000 mg | INTRAMUSCULAR | Status: DC | PRN
  Administered 2017-07-29 – 2017-08-01 (×8): 4 mg via INTRAVENOUS
  Filled 2017-07-29 (×8): qty 2

## 2017-07-29 MED ORDER — AMLODIPINE BESYLATE 5 MG PO TABS
5.0000 mg | ORAL_TABLET | Freq: Every day | ORAL | Status: DC
  Administered 2017-07-31 – 2017-08-02 (×3): 5 mg via ORAL
  Filled 2017-07-29 (×4): qty 1

## 2017-07-29 MED ORDER — PIPERACILLIN-TAZOBACTAM 3.375 G IVPB 30 MIN
3.3750 g | Freq: Once | INTRAVENOUS | Status: AC
  Administered 2017-07-29: 3.375 g via INTRAVENOUS
  Filled 2017-07-29: qty 50

## 2017-07-29 MED ORDER — DEXTROSE-NACL 5-0.45 % IV SOLN
INTRAVENOUS | Status: DC
  Administered 2017-07-29 – 2017-08-02 (×5): via INTRAVENOUS

## 2017-07-29 MED ORDER — VANCOMYCIN HCL IN DEXTROSE 1-5 GM/200ML-% IV SOLN
1000.0000 mg | INTRAVENOUS | Status: DC
  Administered 2017-07-30: 1000 mg via INTRAVENOUS
  Filled 2017-07-29: qty 200

## 2017-07-29 MED ORDER — SODIUM CHLORIDE 0.9 % IV BOLUS
1000.0000 mL | Freq: Once | INTRAVENOUS | Status: AC
  Administered 2017-07-29: 1000 mL via INTRAVENOUS

## 2017-07-29 MED ORDER — ORAL CARE MOUTH RINSE: 15.0000 mL | Freq: Two times a day (BID) | OROMUCOSAL | Status: DC

## 2017-07-29 MED ORDER — FLUOXETINE HCL 20 MG PO CAPS
20.0000 mg | ORAL_CAPSULE | Freq: Every day | ORAL | Status: DC
  Administered 2017-07-30 – 2017-08-02 (×4): 20 mg via ORAL
  Filled 2017-07-29 (×4): qty 1

## 2017-07-29 MED ORDER — DIPHENHYDRAMINE HCL 12.5 MG/5ML PO ELIX: 12.5000 mg | ORAL_SOLUTION | Freq: Four times a day (QID) | ORAL | Status: DC | PRN

## 2017-07-29 MED ORDER — PIPERACILLIN-TAZOBACTAM 3.375 G IVPB
3.3750 g | Freq: Three times a day (TID) | INTRAVENOUS | Status: DC
  Administered 2017-07-29 – 2017-07-30 (×3): 3.375 g via INTRAVENOUS
  Filled 2017-07-29 (×3): qty 50

## 2017-07-29 MED ORDER — OXYCODONE HCL 5 MG PO TABS
5.0000 mg | ORAL_TABLET | ORAL | Status: DC | PRN
  Administered 2017-07-30 – 2017-07-31 (×2): 5 mg via ORAL
  Filled 2017-07-29 (×2): qty 1

## 2017-07-29 MED ORDER — CHLORHEXIDINE GLUCONATE 0.12 % MT SOLN
15.0000 mL | Freq: Two times a day (BID) | OROMUCOSAL | Status: DC
  Administered 2017-07-29 – 2017-08-02 (×6): 15 mL via OROMUCOSAL
  Filled 2017-07-29 (×7): qty 15

## 2017-07-29 MED ORDER — ONDANSETRON 4 MG PO TBDP
4.0000 mg | ORAL_TABLET | Freq: Once | ORAL | Status: DC | PRN
  Filled 2017-07-29: qty 1

## 2017-07-29 MED ORDER — VANCOMYCIN HCL IN DEXTROSE 1-5 GM/200ML-% IV SOLN
1000.0000 mg | Freq: Once | INTRAVENOUS | Status: AC
  Administered 2017-07-29: 1000 mg via INTRAVENOUS
  Filled 2017-07-29: qty 200

## 2017-07-29 MED ORDER — DIPHENHYDRAMINE HCL 50 MG/ML IJ SOLN: 12.5000 mg | Freq: Four times a day (QID) | INTRAMUSCULAR | Status: DC | PRN

## 2017-07-29 MED ORDER — CHLORHEXIDINE GLUCONATE 0.12 % MT SOLN
OROMUCOSAL | Status: AC
  Filled 2017-07-29: qty 15

## 2017-07-29 MED ORDER — ROSUVASTATIN CALCIUM 20 MG PO TABS
20.0000 mg | ORAL_TABLET | Freq: Every day | ORAL | Status: DC
  Administered 2017-07-30 – 2017-08-02 (×4): 20 mg via ORAL
  Filled 2017-07-29 (×4): qty 1

## 2017-07-29 MED ORDER — DOCUSATE SODIUM 100 MG PO CAPS
100.0000 mg | ORAL_CAPSULE | Freq: Two times a day (BID) | ORAL | Status: DC
  Administered 2017-07-29 – 2017-08-02 (×4): 100 mg via ORAL
  Filled 2017-07-29 (×8): qty 1

## 2017-07-29 MED ORDER — TAMSULOSIN HCL 0.4 MG PO CAPS
0.4000 mg | ORAL_CAPSULE | Freq: Every day | ORAL | Status: DC
  Administered 2017-07-29 – 2017-08-01 (×4): 0.4 mg via ORAL
  Filled 2017-07-29 (×4): qty 1

## 2017-07-29 MED ORDER — METOPROLOL SUCCINATE ER 25 MG PO TB24
12.5000 mg | ORAL_TABLET | Freq: Every day | ORAL | Status: DC
  Administered 2017-07-30 – 2017-08-01 (×3): 12.5 mg via ORAL
  Filled 2017-07-29 (×4): qty 1

## 2017-07-29 MED ORDER — ACETAMINOPHEN 325 MG PO TABS
650.0000 mg | ORAL_TABLET | ORAL | Status: DC | PRN
  Administered 2017-07-29 – 2017-07-30 (×2): 650 mg via ORAL
  Filled 2017-07-29 (×2): qty 2

## 2017-07-29 NOTE — Progress Notes (Signed)
Pt states it was ok for his wife to remain in the room while questions were asked on the nursing admission hx. Lucius Conn BSN, RN-BC Admissions RN 07/29/2017 9:42 PM

## 2017-07-29 NOTE — ED Triage Notes (Signed)
Pt was told to come to ED for IV antibiotics d/t increasing fevers despite being on oral abx at home. Pt has had indwelling catheter for the past 7 weeks. Pt took ibuprofen at 4PM today.

## 2017-07-29 NOTE — H&P (Signed)
Office Visit Report     07/27/2017     --------------------------------------------------------------------------------     Cathy Ropp   MRN: [id number]  PRIMARY CARE:     DOB: 03-Nov-1945, 72 year old Male  REFERRING:  R Marcellus Scott, MD   SSN: [id number]  PROVIDER:  Louis Meckel, M.D.     TREATING:  Raynelle Bring, M.D.     LOCATION:  [facility] [phone]     --------------------------------------------------------------------------------     CC/HPI: Fever     Lucas Hardy is a 72 year old patient of Dr. 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 He also has questionable right ureteral obstruction due to a possible ureteral stone status post ureteral stent placement. He recently had his catheter changed 2 days ago. He developed fever this morning to 101 along with generalized malaise. He has been able to tolerate oral intake. He denies any specific pain complaints but does have some general aches. He denies any cough or shortness of breath.        ALLERGIES: Ciprofloxacin - Nausea, Vomiting  None       MEDICATIONS: Crestor   Metoprolol Succinate   Prilosec   Amlodipine Besilate   Flomax   Fluoxetine Hcl        GU PSH: Catheterize For Residual - 06/10/2017  Complex cystometrogram, w/ void pressure and urethral pressure profile studies, any technique - 07/04/2017  Complex Uroflow - 07/04/2017  Cystoscopy Insert Stent, Right - 06/22/2017  Emg surf Electrd - 07/04/2017  Inject For cystogram - 07/04/2017  Intrabd voidng Press - 07/04/2017         PSH Notes: index finger- remove portion     NON-GU PSH: Brain Surgery (Unspecified)  Elbow Arthroscopy, Left       GU PMH: Urinary Retention, Unspec - 07/25/2017, - 06/30/2017, - 06/22/2017, - 06/21/2017  Pyelonephritis (Stable) - 06/21/2017, - 06/20/2017  Acute Cystitis/UTI - 06/20/2017  Flank Pain - 06/20/2017  Hydronephrosis Unspec - 06/20/2017  Acute kidney failure -  06/10/2017  BPH w/LUTS - 06/10/2017  Hydronephrosis - 06/10/2017  Urinary Obstruction - 06/10/2017  Urinary Retention - 06/10/2017       NON-GU PMH: Anxiety  Cardiac murmur, unspecified  Depression  GERD       FAMILY HISTORY: 1 Daughter - Runs in Family  2 sons - Runs in Family  Alzheimer's Disease - Mother  Cancer - Father  Death - Mother, Father     SOCIAL HISTORY: Marital Status: Married  Preferred Language: English; Ethnicity: Not Hispanic Or Latino; Race: White  Current Smoking Status: Patient does not smoke anymore.     Tobacco Use Assessment Completed: Used Tobacco in last 30 days?  Does not drink anymore.   Drinks 2 caffeinated drinks per day.       REVIEW OF SYSTEMS:     GU Review Male:   Patient denies trouble starting your streams, have to strain to urinate , stream starts and stops, frequent urination, hard to postpone urination, get up at night to urinate, leakage of urine, and burning/ pain with urination.   Gastrointestinal (Upper):   Patient denies nausea and vomiting.   Gastrointestinal (Lower):   Patient denies diarrhea and constipation.   Constitutional:   Patient denies fever, night sweats, weight loss, and fatigue.   Skin:   Patient denies skin rash/ lesion and itching.   Eyes:   Patient denies blurred vision and double vision.   Ears/ Nose/ Throat:  Patient denies sore throat and sinus problems.   Hematologic/Lymphatic:   Patient denies swollen glands and easy bruising.   Cardiovascular:   Patient denies leg swelling and chest pains.   Respiratory:   Patient denies cough and shortness of breath.   Endocrine:   Patient denies excessive thirst.   Musculoskeletal:   Patient denies back pain and joint pain.   Neurological:   Patient denies headaches and dizziness.   Psychologic:   Patient denies depression and anxiety.     VITAL SIGNS:       07/27/2017 03:10 PM   Weight 183 lb / 83.01 kg   Height 70 in / 177.8 cm   BP 138/67 mmHg   Pulse 70 /min    Temperature 100.1 F / 37.8 C   BMI 26.3 kg/m     GU PHYSICAL EXAMINATION:        Notes: His catheter is draining well.     MULTI-SYSTEM PHYSICAL EXAMINATION:     Constitutional: Well-nourished. No physical deformities. Normally developed. Good grooming.   Gastrointestinal: No CVA tenderness.        PAST DATA REVIEWED:   Source Of History:  Patient   Records Review:   Previous Patient Records   Urine Test Review:   Urinalysis   Notes:                     I did speak with Dr. Louis Meckel and reviewed his recent urine culture.     PROCEDURES:            Urinalysis  Dipstick Dipstick Cont'd Micro   Color: Yellow Bilirubin: Neg WBC/hpf: 20 - 40/hpf   Appearance: Cloudy Ketones: Neg RBC/hpf: 0 - 2/hpf   Specific Gravity: 1.020 Blood: 2+ Bacteria: Many (>50/hpf)   pH: 5.5 Protein: 1+ Cystals: NS (Not Seen)   Glucose: Neg Urobilinogen: 0.2 Casts: NS (Not Seen)     Nitrites: Positive Trichomonas: Not Present     Leukocyte Esterase: 3+ Mucous: Not Present       Epithelial Cells: NS (Not Seen)       Yeast: NS (Not Seen)       Sperm: Not Present       Notes: microscopic performed on unconcentrated specimen       ASSESSMENT:       ICD-10 Details   1 GU:   Acute Cystitis/UTI - N30.00 Stable     PLAN:               Medications  New Meds: Augmentin 500 mg-125 mg tablet 1 tablet PO BID   #14  0 Refill(s)                Orders  Labs Urine Culture             Schedule  Return Visit/Planned Activity: Keep Scheduled Appointment             Document  Letter(s):  Created for Patient: Clinical Summary            Notes:   1. Febrile UTI: His urine has been cultured. Based on his prior culture results, he will begin empiric treatment with renally dosed Augmentin. He has been instructed to call should he develop nausea/vomiting or worsening fever over the next couple of days. His antibiotics will be adjusted based on his subsequent culture result.     Cc: Dr. Burman Nieves           Next Appointment:  Next Appointment: 08/10/2017 03:00 PM     Appointment Type: Laboratory Appointment     Location: [facility] [phone]     Provider: Lab LAB     Reason for Visit: PRE OP URINE CULTURE DR H          * Signed by Raynelle Bring, M.D. on 07/27/17 at 6:38 PM (EDT)*    Mr. Dada developed progressive worsening fever to over 103 despite treatment with Augmentin and presented to the ED today.  His culture has not returned.  He will be admitted for IV antibiotic therapy with Zosyn and Vancomycin.

## 2017-07-29 NOTE — ED Notes (Signed)
ED Provider at bedside. 

## 2017-07-29 NOTE — ED Notes (Signed)
Clean drainage bag applied to collect urine sample.

## 2017-07-29 NOTE — Progress Notes (Signed)
Pharmacy Antibiotic Note  Lucas Hardy is a 72 y.o. male with hx bladder outlet obstruction with indwelling catheter and recent pyelonephritis on augmentin PTA, presented to the ED on 07/29/2017 for fever.  Urologist recom to start vancomycin and zosyn for infection.   Plan: - vancomycin 1000 mg IV q24h for est AUC 445 - zosyn per MD - daily scr ______________________________________  Height: 5\' 10"  (177.8 cm) Weight: 170 lb (77.1 kg) IBW/kg (Calculated) : 73  Temp (24hrs), Avg:99.4 F (37.4 C), Min:99.1 F (37.3 C), Max:99.7 F (37.6 C)  Recent Labs  Lab 07/29/17 1845 07/29/17 2137  WBC 23.4*  --   CREATININE 1.44*  --   LATICACIDVEN  --  0.80    Estimated Creatinine Clearance: 48.6 mL/min (A) (by C-G formula based on SCr of 1.44 mg/dL (H)).    Allergies  Allergen Reactions  . Ciprofloxacin Nausea And Vomiting  . Effexor [Venlafaxine]     Anxiety      Thank you for allowing pharmacy to be a part of this patient's care.  Lynelle Doctor 07/29/2017 9:44 PM

## 2017-07-29 NOTE — ED Provider Notes (Addendum)
[place] DEPT Provider Note   CSN: [id number] Arrival date & time: 07/29/17  1809     History   Chief Complaint Chief Complaint  Patient presents with  . Urinary Tract Infection    HPI Lucas Hardy is a 72 y.o. male.  Patient is a 72 year old male with a history of chronic kidney disease, recent pyelonephritis with resistant UTI who currently has a catheter presenting today with 2 days of fever.  Patient had his Foley catheter changed on Monday and fever started on Wednesday which is 3 days prior to arrival.  Patient had finished a course of Invanz and Cipro approximately a month ago and was waiting for surgery on May 16.  Because Cipro made him so ill they started him on Augmentin 2 days ago but he continues to have fever spikes and feel generally ill.  He is denying any ongoing pain but will occasionally have shooting sharp pains in the left lower quadrant.  He spoke with urology today who recommended he come to the hospital for further evaluation.  The culture in the office has not grown anything yet.  The history is provided by the patient and the spouse.  Urinary Tract Infection   This is a recurrent problem. The current episode started 2 days ago. The problem has been gradually worsening. The maximum temperature recorded prior to his arrival was 103 to 104 F. Fever duration: 2 days. Associated symptoms include chills and sweats. Pertinent negatives include no vomiting, no hematuria and no flank pain. He has tried antibiotics for the symptoms. His past medical history is significant for urological procedure, recurrent UTIs and catheterization.    Past Medical History:  Diagnosis Date  . Anxiety   . Arthritis of hand, degenerative   . Bursitis of elbow   . Cerumen impaction   . CKD (chronic kidney disease), stage IV ([place])    Lucas Hardy 05/30/2017  . Depression   . Diastolic murmur   . Dysplastic nevus of trunk   . ED (erectile dysfunction)   .  Enlarged prostate   . 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 Fatigue   . GERD (gastroesophageal reflux disease)   . Hemorrhoids   . Herpes simplex with complication   . Hypercholesteremia   . Hyperlipidemia   . Hypertension   . Insomnia   . Leaky heart valve   . Lightheaded   . Lung nodule   . Melanoma ([place])    LLE  . Memory loss   . Myalgia   . Myositis   . On long term drug therapy   . Pain in joint, hand   . Paresthesias   . Pneumonia    "when I was a child" (05/30/2017)  . Prostatism   . Radiculopathy of lumbar region   . Rectal bleeding   . Restless legs syndrome (RLS)   . Seborrheic keratoses, inflamed   . Sinusitis, acute   . Vitamin D deficiency     Patient Active Problem List   Diagnosis Date Noted  . UTI (urinary tract infection) 06/22/2017  . Chest pain 05/30/2017  . Depression 05/12/2013  . Insomnia 05/12/2013  . RLS (restless legs syndrome) 05/12/2013  . BPH (benign prostatic hyperplasia) 05/12/2013  . Mixed hyperlipidemia 05/12/2013    Past Surgical History:  Procedure Laterality Date  . CYSTOSCOPY WITH STENT PLACEMENT Right 06/22/2017   Procedure: CYSTOSCOPY WITH RIGHT URETERAL STENT PLACEMENT;  Surgeon: Raynelle Bring, MD;  Location: [facility];  Service: Urology;  Laterality: Right;  . FINGER SURGERY Left    "cut end off my pointer finger; had it reattached"  . INGUINAL HERNIA REPAIR Left ?1998  . LEFT HEART CATH AND CORONARY ANGIOGRAPHY N/A 05/31/2017   Procedure: LEFT HEART CATH AND CORONARY ANGIOGRAPHY;  Surgeon: Martinique, Peter M, MD;  Location: [facility] LAB;  Service: Cardiovascular;  Laterality: N/A;  . MELANOMA EXCISION     LLE  . TONSILLECTOMY  ~ 1963  . TRANSPHENOIDAL PITUITARY RESECTION  1997        Home Medications    Prior to Admission medications   Medication Sig Start Date End Date Taking? Authorizing Provider  acetaminophen (TYLENOL) 500 MG tablet Take 1,000 mg by mouth daily as needed  for fever.   Yes [provider]  amLODipine (NORVASC) 5 MG tablet Take 5 mg by mouth daily.   Yes [provider]  amoxicillin-clavulanate (AUGMENTIN) 500-125 MG tablet Take 1 tablet by mouth 2 (two) times daily. 07/27/17  Yes [provider]  aspirin 81 MG tablet Take 81 mg by mouth daily.   Yes [provider]  Cholecalciferol 2000 units CAPS Take 2,000 Units by mouth daily.   Yes [provider]  FLUoxetine (PROZAC) 20 MG capsule Take 20 mg by mouth daily.   Yes [provider]  metoprolol succinate (TOPROL XL) 25 MG 24 hr tablet Take 0.5 tablets (12.5 mg total) by mouth daily. Patient taking differently: Take 25 mg by mouth daily.  06/15/17  Yes Duke, Tami Lin, PA  Multiple Vitamin (MULTIVITAMIN WITH MINERALS) TABS tablet Take 1 tablet by mouth daily.   Yes [provider]  neomycin-polymyxin-pramoxine (NEOSPORIN PLUS) 1 % cream Apply 1 application topically daily. APPLY TO UPPER LIP AND CATHETER   Yes [provider]  ondansetron (ZOFRAN) 4 MG tablet Take 4 mg by mouth every 8 (eight) hours as needed. 06/20/17  Yes [provider]  rosuvastatin (CRESTOR) 20 MG tablet Take 20 mg by mouth daily.   Yes [provider]  tamsulosin (FLOMAX) 0.4 MG CAPS capsule Take 1 capsule (0.4 mg total) by mouth at bedtime. 06/24/17  Yes Ardis Hughs, MD    Family History Family History  Problem Relation Age of Onset  . Hypertension Father     Social History Social History   Tobacco Use  . Smoking status: Former Smoker    Packs/day: 1.00    Years: 15.00    Pack years: 15.00    Types: Cigarettes    Last attempt to quit: 04/11/1978    Years since quitting: 39.3  . Smokeless tobacco: Former Systems developer    Types: Chew  Substance Use Topics  . Alcohol use: No    Frequency: Never  . Drug use: No     Allergies   Ciprofloxacin and Effexor [venlafaxine]   Review of Systems Review of Systems    Constitutional: Positive for chills.  Gastrointestinal: Negative for vomiting.  Genitourinary: Negative for flank pain and hematuria.  All other systems reviewed and are negative.    Physical Exam Updated Vital Signs BP 130/73 (BP Location: Left Arm)   Pulse 72   Temp 99.7 F (37.6 C) (Oral)   Resp 14   Ht 5\' 10"  (1.778 m)   Wt 77.1 kg (170 lb)   SpO2 98%   BMI 24.39 kg/m   Physical Exam  Constitutional: He is oriented to person, place, and time. He appears well-developed and well-nourished. No distress.  HENT:  Head:  Normocephalic and atraumatic.  Mouth/Throat: Oropharynx is clear and moist.  Eyes: Pupils are equal, round, and reactive to light. Conjunctivae and EOM are normal.  Neck: Normal range of motion. Neck supple.  Cardiovascular: Normal rate, regular rhythm and intact distal pulses.  No murmur heard. Pulmonary/Chest: Effort normal and breath sounds normal. No respiratory distress. He has no wheezes. He has no rales.  Abdominal: Soft. He exhibits no distension. There is no tenderness. There is no rebound and no guarding.  Genitourinary:  Genitourinary Comments: Foley catheter which is draining yellow urine which is dark  Musculoskeletal: Normal range of motion. He exhibits no edema or tenderness.  Neurological: He is alert and oriented to person, place, and time.  Skin: Skin is warm and dry. No rash noted. No erythema.  Psychiatric: He has a normal mood and affect. His behavior is normal.  Nursing note and vitals reviewed.    ED Treatments / Results  Labs (all labs ordered are listed, but only abnormal results are displayed) Labs Reviewed  COMPREHENSIVE METABOLIC PANEL - Abnormal; Notable for the following components:      Result Value   Sodium 134 (*)    CO2 21 (*)    Glucose, Bld 141 (*)    Creatinine, Ser 1.44 (*)    GFR calc non Af Amer 47 (*)    GFR calc Af Amer 55 (*)    All other components within normal limits  CBC - Abnormal; Notable for the  following components:   WBC 23.4 (*)    RBC 3.51 (*)    Hemoglobin 10.4 (*)    HCT 31.9 (*)    All other components within normal limits  URINALYSIS, ROUTINE W REFLEX MICROSCOPIC - Abnormal; Notable for the following components:   APPearance HAZY (*)    Hgb urine dipstick LARGE (*)    Ketones, ur 20 (*)    Protein, ur 30 (*)    Leukocytes, UA LARGE (*)    WBC, UA >50 (*)    All other components within normal limits  BASIC METABOLIC PANEL - Abnormal; Notable for the following components:   Glucose, Bld 154 (*)    Creatinine, Ser 1.25 (*)    Calcium 8.7 (*)    GFR calc non Af Amer 56 (*)    All other components within normal limits  CBC - Abnormal; Notable for the following components:   WBC 22.3 (*)    RBC 3.29 (*)    Hemoglobin 9.8 (*)    HCT 29.7 (*)    All other components within normal limits  CBC - Abnormal; Notable for the following components:   WBC 18.4 (*)    RBC 3.48 (*)    Hemoglobin 10.2 (*)    HCT 31.2 (*)    All other components within normal limits  BASIC METABOLIC PANEL - Abnormal; Notable for the following components:   Sodium 133 (*)    Chloride 98 (*)    Glucose, Bld 173 (*)    Calcium 8.7 (*)    All other components within normal limits  URINE CULTURE  CULTURE, BLOOD (ROUTINE X 2)  CULTURE, BLOOD (ROUTINE X 2)  CREATININE, SERUM  CREATININE, SERUM  I-STAT CG4 LACTIC ACID, ED    EKG None  Radiology No results found.  Procedures Procedures (including critical care time) CRITICAL CARE Performed by: Keala Drum Total critical care time: 30 minutes Critical care time was exclusive of separately billable procedures and treating other patients. Critical care was necessary to  treat or prevent imminent or life-threatening deterioration. Critical care was time spent personally by me on the following activities: development of treatment plan with patient and/or surrogate as well as nursing, discussions with consultants, evaluation of patient's  response to treatment, examination of patient, obtaining history from patient or surrogate, ordering and performing treatments and interventions, ordering and review of laboratory studies, ordering and review of radiographic studies, pulse oximetry and re-evaluation of patient's condition.   Medications Ordered in ED Medications  ondansetron (ZOFRAN-ODT) disintegrating tablet 4 mg (has no administration in time range)  sodium chloride 0.9 % bolus 1,000 mL (has no administration in time range)     Initial Impression / Assessment and Plan / ED Course  I have reviewed the triage vital signs and the nursing notes.  Pertinent labs & imaging results that were available during my care of the patient were reviewed by me and considered in my medical decision making (see chart for details).     Patient presenting today with symptoms concerning for UTI with an indwelling Foley catheter.  Patient has a complicated history of pyelonephritis, possible ureteral obstruction and resistant UTI requiring IV antibiotics within the last few months who is returning now with persistent fever for the last 3 days after having Foley catheter change in urology office on Monday.  he has been on Augmentin for the last 2 days because when he was taking Cipro it made him very sick with vomiting.  However the Augmentin is not helping and he is continuing to have fever.  Spoke with Dr. Alinda Money today who recommended he come to the hospital.  Patient is hemodynamically stable at this time.  Has a new leukocytosis of 23,000 with stable creatinine.  Lactate, blood cultures and urine cultures done.   Pt started on vanc/zosyn.  Final Clinical Impressions(s) / ED Diagnoses   Final diagnoses:  Urinary tract infection associated with indwelling urethral catheter, initial encounter ([place])  Sepsis, due to unspecified organism [facility])    ED Discharge Orders    None       Blanchie Dessert, MD 07/29/17 2126    Blanchie Dessert,  MD 08/06/17 [phone]

## 2017-07-29 NOTE — ED Notes (Signed)
ED TO INPATIENT HANDOFF REPORT  Name/Age/Gender Lucas Hardy 72 y.o. male  Code Status Code Status History    Date Active Date Inactive Code Status Order ID Comments User Context   06/22/2017 2001 06/24/2017 1739 Full Code [id number]  Stasia Cavalier, MD Inpatient   05/30/2017 1543 05/31/2017 2149 Full Code [id number]  Lucas Manly, NP Inpatient    Advance Directive Documentation     Most Recent Value  Type of Advance Directive  Healthcare Power of Attorney, Living will  Pre-existing out of facility DNR order (yellow form or pink MOST form)  -  "MOST" Form in Place?  -      Home/SNF/Other Home  Chief Complaint Bacterial nfection, sent by dr  Level of Care/Admitting Diagnosis ED Disposition    ED Disposition Condition [place]: [place] [100102]  Level of Care: Med-Surg [16]  Diagnosis: Pyelonephritis [160737]  Admitting Physician: Raynelle Bring [[id number]]  Attending Physician: Raynelle Bring [[id number]]  PT Class (Do Not Modify): Observation [104]  PT Acc Code (Do Not Modify): Observation [10022]       Medical History Past Medical History:  Diagnosis Date  . Anxiety   . Arthritis of hand, degenerative   . Bursitis of elbow   . Cerumen impaction   . CKD (chronic kidney disease), stage IV ([place])    [name] 05/30/2017  . Depression   . Diastolic murmur   . Dysplastic nevus of trunk   . ED (erectile dysfunction)   . Enlarged prostate   . 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 Fatigue   . GERD (gastroesophageal reflux disease)   . Hemorrhoids   . Herpes simplex with complication   . Hypercholesteremia   . Hyperlipidemia   . Hypertension   . Insomnia   . Leaky heart valve   . Lightheaded   . Lung nodule   . Melanoma ([place])    LLE  . Memory loss   . Myalgia   . Myositis   . On long term drug therapy   . Pain in joint, hand   . Paresthesias   . Pneumonia    "when I was a  child" (05/30/2017)  . Prostatism   . Radiculopathy of lumbar region   . Rectal bleeding   . Restless legs syndrome (RLS)   . Seborrheic keratoses, inflamed   . Sinusitis, acute   . Vitamin D deficiency     Allergies Allergies  Allergen Reactions  . Ciprofloxacin Nausea And Vomiting  . Effexor [Venlafaxine]     Anxiety     IV Location/Drains/Wounds Patient Lines/Drains/Airways Status   Active Line/Drains/Airways    Name:   Placement date:   Placement time:   Site:   Days:   Peripheral IV 07/29/17 Left Antecubital   07/29/17    2140    Antecubital   less than 1   Midline Single Lumen 06/24/17 Midline Right Cephalic 8 cm   10/62/69    4854    Cephalic   35   Urethral Catheter BEN MCCORMICK RESIDENT Latex 16 Fr.   06/22/17    1741    Latex   37   Ureteral Drain/Stent Right ureter 6 Fr.   06/22/17    1739    Right ureter   37   Incision (Closed) 06/22/17 Perineum Other (Comment)   06/22/17    1739     37  Labs/Imaging Results for orders placed or performed during the hospital encounter of 07/29/17 (from the past 48 hour(s))  Comprehensive metabolic panel     Status: Abnormal   Collection Time: 07/29/17  6:45 PM  Result Value Ref Range   Sodium 134 (L) 135 - 145 mmol/L   Potassium 3.9 3.5 - 5.1 mmol/L   Chloride 103 101 - 111 mmol/L   CO2 21 (L) 22 - 32 mmol/L   Glucose, Bld 141 (H) 65 - 99 mg/dL   BUN 20 6 - 20 mg/dL   Creatinine, Ser 1.44 (H) 0.61 - 1.24 mg/dL   Calcium 9.2 8.9 - 10.3 mg/dL   Total Protein 7.0 6.5 - 8.1 g/dL   Albumin 3.5 3.5 - 5.0 g/dL   AST 20 15 - 41 U/L   ALT 19 17 - 63 U/L   Alkaline Phosphatase 69 38 - 126 U/L   Total Bilirubin 1.0 0.3 - 1.2 mg/dL   GFR calc non Af Amer 47 (L) >60 mL/min   GFR calc Af Amer 55 (L) >60 mL/min    Comment: (NOTE) The eGFR has been calculated using the CKD EPI equation. This calculation has not been validated in all clinical situations. eGFR's persistently <60 mL/min signify possible Chronic  Kidney Disease.    Anion gap 10 5 - 15    Comment: Performed at [facility], [place] [address], [place], [place] [address]  CBC     Status: Abnormal   Collection Time: 07/29/17  6:45 PM  Result Value Ref Range   WBC 23.4 (H) 4.0 - 10.5 K/uL   RBC 3.51 (L) 4.22 - 5.81 MIL/uL   Hemoglobin 10.4 (L) 13.0 - 17.0 g/dL   HCT 31.9 (L) 39.0 - 52.0 %   MCV 90.9 78.0 - 100.0 fL   MCH 29.6 26.0 - 34.0 pg   MCHC 32.6 30.0 - 36.0 g/dL   RDW 13.5 11.5 - 15.5 %   Platelets 229 150 - 400 K/uL    Comment: Performed at [facility], [place] [address], [place], [place] [address]  I-Stat CG4 Lactic Acid, ED     Status: None   Collection Time: 07/29/17  9:37 PM  Result Value Ref Range   Lactic Acid, Venous 0.80 0.5 - 1.9 mmol/L   No results found.  Pending Labs Unresulted Labs (From admission, onward)   Start     Ordered   07/30/17 0500  Creatinine, serum  Daily,   R     07/29/17 2200   07/29/17 2119  Blood culture (routine x 2)  BLOOD CULTURE X 2,   STAT     07/29/17 2118   07/29/17 2112  Urine Culture  Once,   STAT     07/29/17 2111   07/29/17 1835  Urinalysis, Routine w reflex microscopic  STAT,   STAT     07/29/17 1834   Signed and Held  Basic metabolic panel  Tomorrow morning,   R     Signed and Held   Signed and Held  CBC  Tomorrow morning,   R     Signed and Held      Vitals/Pain Today's Vitals   07/29/17 1816 07/29/17 1831 07/29/17 2047 07/29/17 2155  BP: 131/83  130/73 140/76  Pulse: 91  72 72  Resp: '15  14 15  ' Temp: 99.1 F (37.3 C)  99.7 F (37.6 C)   TempSrc: Oral  Oral   SpO2: 97%  98% 98%  Weight: 170  lb (77.1 kg)     Height: '5\' 10"'  (1.778 m)     PainSc:  5       Isolation Precautions No active isolations  Medications Medications  ondansetron (ZOFRAN-ODT) disintegrating tablet 4 mg (has no administration in time range)  sodium chloride 0.9 % bolus 1,000 mL (1,000 mLs Intravenous New Bag/Given 07/29/17 2128)  vancomycin  (VANCOCIN) IVPB 1000 mg/200 mL premix (1,000 mg Intravenous New Bag/Given 07/29/17 2154)  piperacillin-tazobactam (ZOSYN) IVPB 3.375 g (3.375 g Intravenous New Bag/Given 07/29/17 2153)  vancomycin (VANCOCIN) IVPB 1000 mg/200 mL premix (has no administration in time range)    Mobility walks

## 2017-07-30 ENCOUNTER — Inpatient Hospital Stay (HOSPITAL_COMMUNITY): Payer: PPO

## 2017-07-30 DIAGNOSIS — N12 Tubulo-interstitial nephritis, not specified as acute or chronic: Secondary | ICD-10-CM | POA: Diagnosis present

## 2017-07-30 DIAGNOSIS — Z87440 Personal history of urinary (tract) infections: Secondary | ICD-10-CM | POA: Diagnosis not present

## 2017-07-30 DIAGNOSIS — I129 Hypertensive chronic kidney disease with stage 1 through stage 4 chronic kidney disease, or unspecified chronic kidney disease: Secondary | ICD-10-CM | POA: Diagnosis present

## 2017-07-30 DIAGNOSIS — Z8249 Family history of ischemic heart disease and other diseases of the circulatory system: Secondary | ICD-10-CM | POA: Diagnosis not present

## 2017-07-30 DIAGNOSIS — Z1630 Resistance to unspecified antimicrobial drugs: Secondary | ICD-10-CM | POA: Diagnosis present

## 2017-07-30 DIAGNOSIS — Z888 Allergy status to other drugs, medicaments and biological substances status: Secondary | ICD-10-CM | POA: Diagnosis not present

## 2017-07-30 DIAGNOSIS — N184 Chronic kidney disease, stage 4 (severe): Secondary | ICD-10-CM | POA: Diagnosis present

## 2017-07-30 DIAGNOSIS — Z6824 Body mass index (BMI) 24.0-24.9, adult: Secondary | ICD-10-CM | POA: Diagnosis not present

## 2017-07-30 DIAGNOSIS — Z79899 Other long term (current) drug therapy: Secondary | ICD-10-CM | POA: Diagnosis not present

## 2017-07-30 DIAGNOSIS — Y846 Urinary catheterization as the cause of abnormal reaction of the patient, or of later complication, without mention of misadventure at the time of the procedure: Secondary | ICD-10-CM | POA: Diagnosis present

## 2017-07-30 DIAGNOSIS — A419 Sepsis, unspecified organism: Secondary | ICD-10-CM | POA: Diagnosis present

## 2017-07-30 DIAGNOSIS — Z7982 Long term (current) use of aspirin: Secondary | ICD-10-CM | POA: Diagnosis not present

## 2017-07-30 DIAGNOSIS — E440 Moderate protein-calorie malnutrition: Secondary | ICD-10-CM | POA: Diagnosis present

## 2017-07-30 DIAGNOSIS — K219 Gastro-esophageal reflux disease without esophagitis: Secondary | ICD-10-CM | POA: Diagnosis present

## 2017-07-30 DIAGNOSIS — T83511A Infection and inflammatory reaction due to indwelling urethral catheter, initial encounter: Secondary | ICD-10-CM | POA: Diagnosis present

## 2017-07-30 DIAGNOSIS — N400 Enlarged prostate without lower urinary tract symptoms: Secondary | ICD-10-CM | POA: Diagnosis present

## 2017-07-30 DIAGNOSIS — Z87891 Personal history of nicotine dependence: Secondary | ICD-10-CM | POA: Diagnosis not present

## 2017-07-30 LAB — BASIC METABOLIC PANEL
Anion gap: 10 (ref 5–15)
BUN: 18 mg/dL (ref 6–20)
CALCIUM: 8.7 mg/dL — AB (ref 8.9–10.3)
CO2: 23 mmol/L (ref 22–32)
CREATININE: 1.25 mg/dL — AB (ref 0.61–1.24)
Chloride: 102 mmol/L (ref 101–111)
GFR calc Af Amer: >60 mL/min (ref >60)
GFR, EST NON AFRICAN AMERICAN: 56 mL/min — AB (ref >60)
GLUCOSE: 154 mg/dL — AB (ref 65–99)
Potassium: 3.7 mmol/L (ref 3.5–5.1)
Sodium: 135 mmol/L (ref 135–145)

## 2017-07-30 LAB — CBC
HCT: 29.7 % — ABNORMAL LOW (ref 39.0–52.0)
Hemoglobin: 9.8 g/dL — ABNORMAL LOW (ref 13.0–17.0)
MCH: 29.8 pg (ref 26.0–34.0)
MCHC: 33 g/dL (ref 30.0–36.0)
MCV: 90.3 fL (ref 78.0–100.0)
Platelets: 203 10*3/uL (ref 150–400)
RBC: 3.29 MIL/uL — ABNORMAL LOW (ref 4.22–5.81)
RDW: 13.3 % (ref 11.5–15.5)
WBC: 22.3 10*3/uL — ABNORMAL HIGH (ref 4.0–10.5)

## 2017-07-30 MED ORDER — ENSURE ENLIVE PO LIQD
237.0000 mL | Freq: Three times a day (TID) | ORAL | Status: DC
  Administered 2017-07-30: 237 mL via ORAL

## 2017-07-30 MED ORDER — ZOLPIDEM TARTRATE 5 MG PO TABS
5.0000 mg | ORAL_TABLET | Freq: Every evening | ORAL | Status: DC | PRN
  Administered 2017-07-31: 5 mg via ORAL
  Filled 2017-07-30 (×2): qty 1

## 2017-07-30 MED ORDER — IBUPROFEN 200 MG PO TABS
400.0000 mg | ORAL_TABLET | Freq: Four times a day (QID) | ORAL | Status: DC | PRN
  Administered 2017-07-30: 400 mg via ORAL
  Filled 2017-07-30: qty 2

## 2017-07-30 MED ORDER — PROMETHAZINE HCL 25 MG/ML IJ SOLN
12.5000 mg | Freq: Four times a day (QID) | INTRAMUSCULAR | Status: DC | PRN
  Administered 2017-07-30 – 2017-07-31 (×2): 12.5 mg via INTRAVENOUS
  Filled 2017-07-30: qty 1

## 2017-07-30 MED ORDER — PROMETHAZINE HCL 25 MG/ML IJ SOLN
12.5000 mg | Freq: Four times a day (QID) | INTRAMUSCULAR | Status: DC | PRN
  Filled 2017-07-30: qty 1

## 2017-07-30 MED ORDER — SODIUM CHLORIDE 0.9 % IV SOLN
1.0000 g | INTRAVENOUS | Status: DC
  Administered 2017-07-30 – 2017-07-31 (×2): 1 g via INTRAVENOUS
  Filled 2017-07-30 (×2): qty 1

## 2017-07-30 NOTE — Progress Notes (Signed)
Patient ID: Lucas Hardy, male   DOB: Aug 30, 1945, 71 y.o.   MRN: [id number]    Subjective: Pt still with lethargy and poor appetite.  Fever up to 103.1 earlier today.  Objective: Vital signs in last 24 hours: Temp:  [98.1 F (36.7 C)-103.1 F (39.5 C)] 101.1 F (38.4 C) (04/27 1328) Pulse Rate:  [60-91] 68 (04/27 1328) Resp:  [14-20] 19 (04/27 1328) BP: (116-151)/(68-83) 131/68 (04/27 1328) SpO2:  [95 %-100 %] 95 % (04/27 1328) Weight:  [77.1 kg (170 lb)] 77.1 kg (170 lb) (04/26 1816)  Intake/Output from previous day: 04/26 0701 - 04/27 0700 In: 753.3 [I.V.:603.3; IV Piggyback:150] Out: 550 [Urine:550] Intake/Output this shift: Total I/O In: 1522 [P.O.:322; I.V.:1000; IV Piggyback:200] Out: 500 [Urine:500]  Physical Exam:  General: Alert and oriented GU: Urine clear Abd: Soft, NT, No CVAT  Lab Results: Recent Labs    07/29/17 1845 07/30/17 0408  HGB 10.4* 9.8*  HCT 31.9* 29.7*   CBC Latest Ref Rng & Units 07/30/2017 07/29/2017 06/24/2017  WBC 4.0 - 10.5 K/uL 22.3(H) 23.4(H) 17.3(H)  Hemoglobin 13.0 - 17.0 g/dL 9.8(L) 10.4(L) 10.2(L)  Hematocrit 39.0 - 52.0 % 29.7(L) 31.9(L) 29.9(L)  Platelets 150 - 400 K/uL 203 229 190     BMET Recent Labs    07/29/17 1845 07/30/17 0408  NA 134* 135  K 3.9 3.7  CL 103 102  CO2 21* 23  GLUCOSE 141* 154*  BUN 20 18  CREATININE 1.44* 1.25*  CALCIUM 9.2 8.7*     Studies/Results: Culture obtained from [facility] positive for 2 different E. Coli sensitive to ciprofloxacin (GI upset previously) and levofloxacin and nitrofurantoin orally.  Also sensitive to all carbapenems, gentamicin, Zosyn, and amoxicillin, clavulanate, and cefotetan.  Assessment/Plan: Febrile UTI: Pt still with high fever despite appropriate culture sensitive antibiotics (he had been on Augmentin since Wednesday and Zosyn since last evening).  Will now switch to Ertapenem to optimize treatment of urine or bacteremia with hospital cultures pending.  Will  check CXR to rule out other source.  Also, will consider CT scan abdomen and pelvis tomorrow if still spiking high fevers and not clinically improved to rule out other source including prostatic abscess. CBC and BMP in AM.   LOS: 0 days   Carmel Waddington,LES 07/30/2017, 3:17 PM

## 2017-07-31 LAB — URINE CULTURE: CULTURE: NO GROWTH

## 2017-07-31 LAB — BASIC METABOLIC PANEL
Anion gap: 10 (ref 5–15)
BUN: 11 mg/dL (ref 6–20)
CHLORIDE: 98 mmol/L — AB (ref 101–111)
CO2: 25 mmol/L (ref 22–32)
Calcium: 8.7 mg/dL — ABNORMAL LOW (ref 8.9–10.3)
Creatinine, Ser: 1.16 mg/dL (ref 0.61–1.24)
GFR calc Af Amer: >60 mL/min (ref >60)
GFR calc non Af Amer: >60 mL/min (ref >60)
Glucose, Bld: 173 mg/dL — ABNORMAL HIGH (ref 65–99)
POTASSIUM: 3.7 mmol/L (ref 3.5–5.1)
Sodium: 133 mmol/L — ABNORMAL LOW (ref 135–145)

## 2017-07-31 LAB — CBC
HEMATOCRIT: 31.2 % — AB (ref 39.0–52.0)
HEMOGLOBIN: 10.2 g/dL — AB (ref 13.0–17.0)
MCH: 29.3 pg (ref 26.0–34.0)
MCHC: 32.7 g/dL (ref 30.0–36.0)
MCV: 89.7 fL (ref 78.0–100.0)
Platelets: 216 10*3/uL (ref 150–400)
RBC: 3.48 MIL/uL — ABNORMAL LOW (ref 4.22–5.81)
RDW: 13.3 % (ref 11.5–15.5)
WBC: 18.4 10*3/uL — ABNORMAL HIGH (ref 4.0–10.5)

## 2017-07-31 NOTE — Progress Notes (Signed)
Patient ID: Lucas Hardy, male   DOB: Oct 30, 1945, 72 y.o.   MRN: [id number]    Subjective: Pt with severe nausea and some vomiting overnight.  No fever since yesterday afternoon.  Now on Ertapenem.  Objective: Vital signs in last 24 hours: Temp:  [97.8 F (36.6 C)-103.1 F (39.5 C)] 98.3 F (36.8 C) (04/28 0559) Pulse Rate:  [54-68] 66 (04/28 0559) Resp:  [19-20] 20 (04/28 0559) BP: (131-158)/(63-85) 158/85 (04/28 0559) SpO2:  [95 %-96 %] 96 % (04/28 0559)  Intake/Output from previous day: 04/27 0701 - 04/28 0700 In: 3417 [P.O.:617; I.V.:2500; IV Piggyback:300] Out: 2575 [Urine:2575] Intake/Output this shift: No intake/output data recorded.  Physical Exam:  General: Alert and oriented CV: RRR Lungs: Clear Abdomen: Soft, ND, NT GU: Urine clear  Lab Results: Recent Labs    07/29/17 1845 07/30/17 0408 07/31/17 0509  HGB 10.4* 9.8* 10.2*  HCT 31.9* 29.7* 31.2*   CBC Latest Ref Rng & Units 07/31/2017 07/30/2017 07/29/2017  WBC 4.0 - 10.5 K/uL 18.4(H) 22.3(H) 23.4(H)  Hemoglobin 13.0 - 17.0 g/dL 10.2(L) 9.8(L) 10.4(L)  Hematocrit 39.0 - 52.0 % 31.2(L) 29.7(L) 31.9(L)  Platelets 150 - 400 K/uL 216 203 229     BMET Recent Labs    07/30/17 0408 07/31/17 0509  NA 135 133*  K 3.7 3.7  CL 102 98*  CO2 23 25  GLUCOSE 154* 173*  BUN 18 11  CREATININE 1.25* 1.16  CALCIUM 8.7* 8.7*     Studies/Results: Dg Chest 2 View  Result Date: 07/30/2017 CLINICAL DATA:  Fever today and treated for kidney infection. EXAM: CHEST - 2 VIEW COMPARISON:  05/10/2013 FINDINGS: Lungs are adequately inflated and otherwise clear. Cardiomediastinal silhouette and remainder of the exam is unchanged. IMPRESSION: No active cardiopulmonary disease. Electronically Signed   By: Marin Olp M.D.   On: 07/30/2017 18:17    Assessment/Plan: 1) Febrile UTI: Clinically improving based on fever curve and decreasing WBC.  Hospital cultures pending. Continue Ertapenem today.  Culture was sensitive to  Levaquin and may be an oral option for him (intolerance to Cipro was GI upset and not a true allergy) to try but may be best to try while he is in the hospital on current IV therapy to make sure he can tolerate. I will leave this to Dr. Carlton Adam discretion.    2) N/V: Possibly related to infection/medications.  He will attempt to eat again today as he is not currently nauseated but does have anorexia.  Continue antiemetics.   LOS: 1 day   [name],LES 07/31/2017, 9:33 AM

## 2017-07-31 NOTE — Progress Notes (Signed)
Initial Nutrition Assessment  DOCUMENTATION CODES:   Non-severe (moderate) malnutrition in context of acute illness/injury  INTERVENTION:   Continue Ensure Enlive po BID, each supplement provides 350 kcal and 20 grams of protein  NUTRITION DIAGNOSIS:   Moderate Malnutrition related to acute illness, poor appetite(fever) as evidenced by percent weight loss, energy intake < 75% for > 7 days.  GOAL:   Patient will meet greater than or equal to 90% of their needs  MONITOR:   PO intake, Supplement acceptance, Labs, Weight trends, I & O's  REASON FOR ASSESSMENT:   Malnutrition Screening Tool   ASSESSMENT:   72 year old male with a history of chronic kidney disease, recent pyelonephritis with resistant UTI who currently has a catheter presenting today with 2 days of fever.  Patient refusing meals, had episodes of N/V last night d/t fever. Pt reports no nausea at this time and will attempt to eat. Pt reports poor appetite now and PTA. Will continue Ensure supplements.   Per chart review, pt has lost 20 lb since 2/26 (11% wt loss x 2 months, significant for time frame).   Medications: D5-.45% NaCl infusion at 100 ml/hr -provides 408 kcal, IV Zofran PRN, IV Phenergan PRN Labs reviewed:  Low Na  NUTRITION - FOCUSED PHYSICAL EXAM:  Nutrition focused physical exam shows no sign of depletion of muscle mass or body fat.  Diet Order:  Diet regular Room service appropriate? Yes; Fluid consistency: Thin  EDUCATION NEEDS:   No education needs have been identified at this time  Skin:  Skin Assessment: Reviewed RN Assessment  Last BM:  4/24  Height:   Ht Readings from Last 1 Encounters:  07/29/17 5\' 10"  (1.778 m)    Weight:   Wt Readings from Last 1 Encounters:  07/29/17 170 lb (77.1 kg)    Ideal Body Weight:  75.5 kg  BMI:  Body mass index is 24.39 kg/m.  Estimated Nutritional Needs:   Kcal:  1900-2100  Protein:  90-100g  Fluid:  2L/day  Clayton Bibles, MS, RD,  LDN [place] Dietitian Pager: [phone] After Hours Pager: [phone]

## 2017-08-01 DIAGNOSIS — E440 Moderate protein-calorie malnutrition: Secondary | ICD-10-CM

## 2017-08-01 LAB — CREATININE, SERUM
Creatinine, Ser: 1 mg/dL (ref 0.61–1.24)
GFR calc Af Amer: >60 mL/min (ref >60)

## 2017-08-01 MED ORDER — PANTOPRAZOLE SODIUM 40 MG IV SOLR
40.0000 mg | Freq: Two times a day (BID) | INTRAVENOUS | Status: DC
  Administered 2017-08-01 (×2): 40 mg via INTRAVENOUS
  Filled 2017-08-01 (×4): qty 40

## 2017-08-01 MED ORDER — GI COCKTAIL ~~LOC~~: 30.0000 mL | Freq: Once | ORAL | Status: DC

## 2017-08-01 MED ORDER — LEVOFLOXACIN 750 MG PO TABS
750.0000 mg | ORAL_TABLET | Freq: Every day | ORAL | Status: DC
  Administered 2017-08-01 – 2017-08-02 (×2): 750 mg via ORAL
  Filled 2017-08-01 (×2): qty 1

## 2017-08-01 NOTE — Progress Notes (Signed)
Patient ID: Lucas Hardy, male   DOB: 1945-09-11, 72 y.o.   MRN: [id number]    Subjective: Continued nausea/vomiting.  Phenergan made patient sleepy/delirius.  Zofran seemed to calm symptoms. Epigastric burning Emesis immediately when he eats Continued on Ertapenem.  Objective: Vital signs in last 24 hours: Temp:  [98.2 F (36.8 C)-98.8 F (37.1 C)] 98.2 F (36.8 C) (04/29 0610) Pulse Rate:  [59-65] 59 (04/29 0610) Resp:  [16-18] 18 (04/29 0610) BP: (162-168)/(84-92) 168/88 (04/29 0610) SpO2:  [96 %-98 %] 96 % (04/29 0610)  Intake/Output from previous day: 04/28 0701 - 04/29 0700 In: 3278.3 [P.O.:800; I.V.:2378.3; IV Piggyback:100] Out: 1900 [Urine:1900] Intake/Output this shift: No intake/output data recorded.  Physical Exam:  General: Alert and oriented CV: RRR Lungs: Clear Abdomen: Soft, ND, NT GU: Urine clear  Lab Results: Recent Labs    07/29/17 1845 07/30/17 0408 07/31/17 0509  HGB 10.4* 9.8* 10.2*  HCT 31.9* 29.7* 31.2*   CBC Latest Ref Rng & Units 07/31/2017 07/30/2017 07/29/2017  WBC 4.0 - 10.5 K/uL 18.4(H) 22.3(H) 23.4(H)  Hemoglobin 13.0 - 17.0 g/dL 10.2(L) 9.8(L) 10.4(L)  Hematocrit 39.0 - 52.0 % 31.2(L) 29.7(L) 31.9(L)  Platelets 150 - 400 K/uL 216 203 229     BMET Recent Labs    07/30/17 0408 07/31/17 0509 08/01/17 0511  NA 135 133*  --   K 3.7 3.7  --   CL 102 98*  --   CO2 23 25  --   GLUCOSE 154* 173*  --   BUN 18 11  --   CREATININE 1.25* 1.16 1.00  CALCIUM 8.7* 8.7*  --      Studies/Results: Dg Chest 2 View  Result Date: 07/30/2017 CLINICAL DATA:  Fever today and treated for kidney infection. EXAM: CHEST - 2 VIEW COMPARISON:  05/10/2013 FINDINGS: Lungs are adequately inflated and otherwise clear. Cardiomediastinal silhouette and remainder of the exam is unchanged. IMPRESSION: No active cardiopulmonary disease. Electronically Signed   By: Marin Olp M.D.   On: 07/30/2017 18:17    Assessment/Plan: 1) Febrile UTI: Blood Cx  are negative.  No fevers since Friday night.  Urine cultures sensitive to Levaquin.  Will test in the hospital - question whether he has true Cipro allergy or if his symptoms are part of different GI process.  2) N/V: Possibly related to infection/medications.  Would like to put patient on PPI, will discuss with GI and see if he needs to seen formerly.      LOS: 2 days   Ardis Hughs 08/01/2017, 8:10 AM

## 2017-08-01 NOTE — Progress Notes (Signed)
Report received from [place], [place]. Care assumed for pt at 1115. Pt resting in bed. C/O persistent nausea. IV Zofran given per PRN orders. Pt declines Ensure and oral mouthwash d/t nausea. Pt denies any other pain/discomfort. POC discussed w/ pt. Will monitor for effect post Zofran.

## 2017-08-02 LAB — CREATININE, SERUM
Creatinine, Ser: 1.15 mg/dL (ref 0.61–1.24)
GFR calc non Af Amer: >60 mL/min (ref >60)

## 2017-08-02 MED ORDER — ONDANSETRON HCL 4 MG PO TABS: 4.0000 mg | ORAL_TABLET | Freq: Three times a day (TID) | ORAL | 0 refills | Status: DC | PRN

## 2017-08-02 MED ORDER — PANTOPRAZOLE SODIUM 40 MG PO TBEC: 40.0000 mg | DELAYED_RELEASE_TABLET | Freq: Two times a day (BID) | ORAL | 11 refills | Status: DC

## 2017-08-02 MED ORDER — LEVOFLOXACIN 750 MG PO TABS: 750.0000 mg | ORAL_TABLET | Freq: Every day | ORAL | 0 refills | Status: DC

## 2017-08-02 MED ORDER — PANTOPRAZOLE SODIUM 40 MG PO TBEC
40.0000 mg | DELAYED_RELEASE_TABLET | Freq: Two times a day (BID) | ORAL | Status: DC
  Administered 2017-08-02: 40 mg via ORAL
  Filled 2017-08-02: qty 1

## 2017-08-02 NOTE — Discharge Instructions (Signed)

## 2017-08-02 NOTE — Progress Notes (Signed)
D/C instructions reviewed w/ pt and wife. Both verbalize understanding and all questions answered. Pt d/c in w/c in stable condition by NT to wife's car. Pt in possession of d/c packet, scripts, and all personal belongings.

## 2017-08-02 NOTE — Plan of Care (Signed)
  Problem: Clinical Measurements: Goal: Will remain free from infection Outcome: Adequate for Discharge   

## 2017-08-02 NOTE — Consult Note (Signed)
   [facility] CM Inpatient Consult   08/02/2017  Lucas Hardy [birth year] [id number]    Patient screened for potential [facility] Care Management services due to multiple hospitalizations.  Went to bedside to discuss [facility] Management with [name]. He pleasantly declines [facility] Care Management program at this time.   Denies having any concerns with medications, transportation, community resources or disease management. Mr. Sunday reports his wife is a retired Pharmacist, hospital and she keeps up with all of his medications, MD appointments and the like. Made him aware that he will likely receive post EMMI discharge call.   Provided [facility] Care Management brochure with contact information and 24-hr nurse advice line magnet.   Appreciative of visit.   Will make inpatient RNCM aware [name] declined [facility].   Marthenia Rolling, MSN-Ed, RN,BSN [facility] Liaison [phone]

## 2017-08-02 NOTE — Discharge Summary (Signed)
Date of admission: 07/29/2017  Date of discharge: 08/02/2017  Admission diagnosis: Pyelonephritis  Discharge diagnosis: same  Secondary diagnoses:  Patient Active Problem List   Diagnosis Date Noted  . Malnutrition of moderate degree 08/01/2017  . Pyelonephritis 07/29/2017  . UTI (urinary tract infection) 06/22/2017  . Chest pain 05/30/2017  . Depression 05/12/2013  . Insomnia 05/12/2013  . RLS (restless legs syndrome) 05/12/2013  . BPH (benign prostatic hyperplasia) 05/12/2013  . Mixed hyperlipidemia 05/12/2013    Procedures performed:   History and Physical: For full details, please see admission history and physical. Briefly, Lucas Hardy is a 72 y.o. year old patient with urinary retention and multi-drug resistant UTIs who presented with fever that did not respond initially to PO abx.   Hospital Course:  Patient was admitted to the hospital and placed on Ertapenem.  His urine cultures returned and he was transitioned to Levaquin which he has tolerated for 24 hours without worsening nausea.  He is being discharged with 16 more days of Levaquin (a bridge to surgery).   During this hospitalization he had severe nausea/vomitting - despite avoidance of flouroquinolones.  Thus we made the clinical determination that his nausea/vomitting was likely not related to the medications but instead from severe GERD.  He was placed on BID protonix IV and his symptoms improved.  While he did not have a great appetite at the time of discharge he was at least able to keep food down.  He is being discharged with his catheter.  He was discharged on HD # 5.  PE: NAD Vitals:   08/01/17 1053 08/01/17 1413 08/01/17 2116 08/02/17 0612  BP:  (!) 150/87 (!) 146/81 123/78  Pulse: 69 (!) 56 (!) 55 (!) 48  Resp:  16 18 12   Temp:  98.2 F (36.8 C) 98 F (36.7 C) (!) 97.5 F (36.4 C)  TempSrc:  Oral Oral Oral  SpO2:  98% 98% 99%  Weight:      Height:      Non-labored breathing - resting  comfortably. ABdomen is soft Foley is draining clear yellow urine   Laboratory values:  Recent Labs    07/31/17 0509  WBC 18.4*  HGB 10.2*  HCT 31.2*   Recent Labs    07/31/17 0509 08/01/17 0511 08/02/17 0518  NA 133*  --   --   K 3.7  --   --   CL 98*  --   --   CO2 25  --   --   GLUCOSE 173*  --   --   BUN 11  --   --   CREATININE 1.16 1.00 1.15  CALCIUM 8.7*  --   --    No results for input(s): LABPT, INR in the last 72 hours. No results for input(s): LABURIN in the last 72 hours. Results for orders placed or performed during the hospital encounter of 07/29/17  Blood culture (routine x 2)     Status: None (Preliminary result)   Collection Time: 07/29/17  9:29 PM  Result Value Ref Range Status   Specimen Description   Final    BLOOD LEFT ANTECUBITAL Performed at [facility] [address], [place], [place] [address]    Special Requests   Final    BOTTLES DRAWN AEROBIC AND ANAEROBIC Blood Culture adequate volume Performed at [facility] [address] [place], [place] [address]    Culture   Final    NO GROWTH 2 DAYS Performed at  [facility], [place] [address] [place], [place] [address]    Report Status PENDING  Incomplete  Urine Culture     Status: None   Collection Time: 07/29/17  9:45 PM  Result Value Ref Range Status   Specimen Description   Final    URINE, RANDOM Performed at [facility] [address] [place], [place] [address]    Special Requests   Final    NONE Performed at [facility], [place] [address], [place], [place] [address]    Culture   Final    NO GROWTH Performed at [facility], [place] [address], [place], [place] [address]    Report Status 07/31/2017 FINAL  Final  Blood culture (routine x 2)     Status: None (Preliminary result)   Collection Time: 07/29/17 11:49 PM  Result Value Ref Range Status   Specimen Description   Final    BLOOD RIGHT  ANTECUBITAL Performed at [facility] [address] [place], [place] [address]    Special Requests   Final    BOTTLES DRAWN AEROBIC AND ANAEROBIC Blood Culture adequate volume Performed at [facility] [address], [place], [place] [address]    Culture   Final    NO GROWTH 2 DAYS Performed at [facility] [address] [place], [place] [address]    Report Status PENDING  Incomplete    Disposition: Home  Discharge instruction: The patient was instructed to be ambulatory but told to refrain from heavy lifting, strenuous activity, or driving.   Discharge medications:  Allergies as of 08/02/2017      Reactions   Effexor [venlafaxine]    Anxiety   Ciprofloxacin Nausea And Vomiting      Medication List    STOP taking these medications   amoxicillin-clavulanate 500-125 MG tablet Commonly known as:  AUGMENTIN   tamsulosin 0.4 MG Caps capsule Commonly known as:  FLOMAX     TAKE these medications   acetaminophen 500 MG tablet Commonly known as:  TYLENOL Take 1,000 mg by mouth daily as needed for fever.   amLODipine 5 MG tablet Commonly known as:  NORVASC Take 5 mg by mouth daily.   aspirin 81 MG tablet Take 81 mg by mouth daily.   Cholecalciferol 2000 units Caps Take 2,000 Units by mouth daily.   FLUoxetine 20 MG capsule Commonly known as:  PROZAC Take 20 mg by mouth daily.   levofloxacin 750 MG tablet Commonly known as:  LEVAQUIN Take 1 tablet (750 mg total) by mouth daily.   metoprolol succinate 25 MG 24 hr tablet Commonly known as:  TOPROL XL Take 0.5 tablets (12.5 mg total) by mouth daily. What changed:  how much to take   multivitamin with minerals Tabs tablet Take 1 tablet by mouth daily.   neomycin-polymyxin-pramoxine 1 % cream Commonly known as:  NEOSPORIN PLUS Apply 1 application topically daily. APPLY TO UPPER LIP AND CATHETER   ondansetron 4 MG tablet Commonly known as:  ZOFRAN Take 1 tablet  (4 mg total) by mouth every 8 (eight) hours as needed for nausea. What changed:  reasons to take this   pantoprazole 40 MG tablet Commonly known as:  PROTONIX Take 1 tablet (40 mg total) by mouth 2 (two) times daily.   rosuvastatin 20 MG tablet Commonly known as:  CRESTOR Take 20 mg by mouth daily.       Followup:  Follow-up Information  Ardis Hughs, MD Follow up on 08/18/2017.   Specialty:  Urology Contact information: [place] [address] [phone]

## 2017-08-02 NOTE — Care Management Important Message (Signed)
Important Message  Patient Details  Name: REZNOR FERRANDO MRN: [id number] Date of Birth: 1946-01-07   Medicare Important Message Given:  Yes    Kerin Salen 08/02/2017, 11:08 AMImportant Message  Patient Details  Name: MIACHEL NARDELLI MRN: [id number] Date of Birth: Sep 06, 1945   Medicare Important Message Given:  Yes    Kerin Salen 08/02/2017, 11:08 AM

## 2017-08-04 LAB — CULTURE, BLOOD (ROUTINE X 2)
CULTURE: NO GROWTH
CULTURE: NO GROWTH
Special Requests: ADEQUATE
Special Requests: ADEQUATE

## 2017-08-10 DIAGNOSIS — N3000 Acute cystitis without hematuria: Secondary | ICD-10-CM | POA: Diagnosis not present

## 2017-08-11 ENCOUNTER — Encounter (HOSPITAL_BASED_OUTPATIENT_CLINIC_OR_DEPARTMENT_OTHER): Payer: Self-pay | Admitting: *Deleted

## 2017-08-12 ENCOUNTER — Other Ambulatory Visit: Payer: Self-pay

## 2017-08-12 ENCOUNTER — Encounter (HOSPITAL_BASED_OUTPATIENT_CLINIC_OR_DEPARTMENT_OTHER): Payer: Self-pay | Admitting: *Deleted

## 2017-08-12 NOTE — Progress Notes (Signed)
Spoke w/ pt wife via phone for pre-op interview.  Pt hoh over the phone.  Npo after mn.  Current EKG in chart and epic.  Getting CBC and CMET done Tuesday 08-16-2017.  Will take toprol and protonix am dos w/ sips of water.  Review  RCC guidelines , pt to bring meds.

## 2017-08-16 ENCOUNTER — Inpatient Hospital Stay (HOSPITAL_COMMUNITY): Admission: RE | Admit: 2017-08-16 | Payer: PPO | Source: Ambulatory Visit

## 2017-08-17 ENCOUNTER — Encounter (HOSPITAL_COMMUNITY)
Admission: RE | Admit: 2017-08-17 | Discharge: 2017-08-17 | Disposition: A | Discharge status: Home / Self Care | Payer: PPO | Source: Ambulatory Visit | Attending: Urology | Admitting: Urology

## 2017-08-17 DIAGNOSIS — I10 Essential (primary) hypertension: Secondary | ICD-10-CM | POA: Diagnosis not present

## 2017-08-17 DIAGNOSIS — N1330 Unspecified hydronephrosis: Secondary | ICD-10-CM | POA: Diagnosis not present

## 2017-08-17 DIAGNOSIS — F419 Anxiety disorder, unspecified: Secondary | ICD-10-CM | POA: Diagnosis not present

## 2017-08-17 DIAGNOSIS — I2510 Atherosclerotic heart disease of native coronary artery without angina pectoris: Secondary | ICD-10-CM | POA: Diagnosis not present

## 2017-08-17 DIAGNOSIS — R339 Retention of urine, unspecified: Secondary | ICD-10-CM | POA: Diagnosis present

## 2017-08-17 DIAGNOSIS — N401 Enlarged prostate with lower urinary tract symptoms: Secondary | ICD-10-CM | POA: Diagnosis not present

## 2017-08-17 DIAGNOSIS — I739 Peripheral vascular disease, unspecified: Secondary | ICD-10-CM | POA: Diagnosis not present

## 2017-08-17 DIAGNOSIS — Z79899 Other long term (current) drug therapy: Secondary | ICD-10-CM | POA: Diagnosis not present

## 2017-08-17 DIAGNOSIS — N320 Bladder-neck obstruction: Secondary | ICD-10-CM | POA: Diagnosis not present

## 2017-08-17 DIAGNOSIS — K219 Gastro-esophageal reflux disease without esophagitis: Secondary | ICD-10-CM | POA: Diagnosis not present

## 2017-08-17 DIAGNOSIS — Z87891 Personal history of nicotine dependence: Secondary | ICD-10-CM | POA: Diagnosis not present

## 2017-08-17 DIAGNOSIS — R338 Other retention of urine: Secondary | ICD-10-CM | POA: Diagnosis not present

## 2017-08-17 DIAGNOSIS — F329 Major depressive disorder, single episode, unspecified: Secondary | ICD-10-CM | POA: Diagnosis not present

## 2017-08-17 LAB — COMPREHENSIVE METABOLIC PANEL
ALT: 17 U/L (ref 17–63)
AST: 21 U/L (ref 15–41)
Albumin: 3.6 g/dL (ref 3.5–5.0)
Alkaline Phosphatase: 84 U/L (ref 38–126)
Anion gap: 9 (ref 5–15)
BILIRUBIN TOTAL: 0.6 mg/dL (ref 0.3–1.2)
BUN: 18 mg/dL (ref 6–20)
CO2: 24 mmol/L (ref 22–32)
CREATININE: 1.25 mg/dL — AB (ref 0.61–1.24)
Calcium: 8.7 mg/dL — ABNORMAL LOW (ref 8.9–10.3)
Chloride: 105 mmol/L (ref 101–111)
GFR calc Af Amer: >60 mL/min (ref >60)
GFR, EST NON AFRICAN AMERICAN: 56 mL/min — AB (ref >60)
Glucose, Bld: 95 mg/dL (ref 65–99)
Potassium: 4.1 mmol/L (ref 3.5–5.1)
Sodium: 138 mmol/L (ref 135–145)
TOTAL PROTEIN: 6.5 g/dL (ref 6.5–8.1)

## 2017-08-17 LAB — CBC
HEMATOCRIT: 31.2 % — AB (ref 39.0–52.0)
Hemoglobin: 10.1 g/dL — ABNORMAL LOW (ref 13.0–17.0)
MCH: 29.1 pg (ref 26.0–34.0)
MCHC: 32.4 g/dL (ref 30.0–36.0)
MCV: 89.9 fL (ref 78.0–100.0)
Platelets: 307 10*3/uL (ref 150–400)
RBC: 3.47 MIL/uL — ABNORMAL LOW (ref 4.22–5.81)
RDW: 13.8 % (ref 11.5–15.5)
WBC: 7.4 10*3/uL (ref 4.0–10.5)

## 2017-08-17 MED ORDER — GENTAMICIN SULFATE 40 MG/ML IJ SOLN
400.0000 mg | Freq: Once | INTRAVENOUS | Status: DC
  Filled 2017-08-17 (×3): qty 10

## 2017-08-18 ENCOUNTER — Encounter (HOSPITAL_BASED_OUTPATIENT_CLINIC_OR_DEPARTMENT_OTHER)
Admission: RE | Disposition: A | Discharge status: Home / Self Care | Payer: Self-pay | Source: Ambulatory Visit | Attending: Urology

## 2017-08-18 ENCOUNTER — Encounter (HOSPITAL_BASED_OUTPATIENT_CLINIC_OR_DEPARTMENT_OTHER): Payer: Self-pay | Admitting: *Deleted

## 2017-08-18 ENCOUNTER — Ambulatory Visit (HOSPITAL_BASED_OUTPATIENT_CLINIC_OR_DEPARTMENT_OTHER): Payer: PPO | Admitting: Anesthesiology

## 2017-08-18 ENCOUNTER — Observation Stay (HOSPITAL_BASED_OUTPATIENT_CLINIC_OR_DEPARTMENT_OTHER)
Admission: RE | Admit: 2017-08-18 | Discharge: 2017-08-19 | Disposition: A | Discharge status: Home / Self Care | Payer: PPO | Source: Ambulatory Visit | Attending: Urology | Admitting: Urology

## 2017-08-18 ENCOUNTER — Other Ambulatory Visit: Payer: Self-pay

## 2017-08-18 DIAGNOSIS — I2510 Atherosclerotic heart disease of native coronary artery without angina pectoris: Secondary | ICD-10-CM | POA: Insufficient documentation

## 2017-08-18 DIAGNOSIS — F329 Major depressive disorder, single episode, unspecified: Secondary | ICD-10-CM | POA: Diagnosis not present

## 2017-08-18 DIAGNOSIS — N1330 Unspecified hydronephrosis: Secondary | ICD-10-CM | POA: Diagnosis not present

## 2017-08-18 DIAGNOSIS — N138 Other obstructive and reflux uropathy: Secondary | ICD-10-CM | POA: Diagnosis present

## 2017-08-18 DIAGNOSIS — I10 Essential (primary) hypertension: Secondary | ICD-10-CM | POA: Diagnosis not present

## 2017-08-18 DIAGNOSIS — Z79899 Other long term (current) drug therapy: Secondary | ICD-10-CM | POA: Insufficient documentation

## 2017-08-18 DIAGNOSIS — R338 Other retention of urine: Secondary | ICD-10-CM | POA: Insufficient documentation

## 2017-08-18 DIAGNOSIS — A419 Sepsis, unspecified organism: Secondary | ICD-10-CM

## 2017-08-18 DIAGNOSIS — Z87891 Personal history of nicotine dependence: Secondary | ICD-10-CM | POA: Diagnosis not present

## 2017-08-18 DIAGNOSIS — K219 Gastro-esophageal reflux disease without esophagitis: Secondary | ICD-10-CM | POA: Insufficient documentation

## 2017-08-18 DIAGNOSIS — N320 Bladder-neck obstruction: Secondary | ICD-10-CM | POA: Diagnosis not present

## 2017-08-18 DIAGNOSIS — F419 Anxiety disorder, unspecified: Secondary | ICD-10-CM | POA: Insufficient documentation

## 2017-08-18 DIAGNOSIS — I739 Peripheral vascular disease, unspecified: Secondary | ICD-10-CM | POA: Insufficient documentation

## 2017-08-18 DIAGNOSIS — N401 Enlarged prostate with lower urinary tract symptoms: Principal | ICD-10-CM | POA: Insufficient documentation

## 2017-08-18 DIAGNOSIS — N400 Enlarged prostate without lower urinary tract symptoms: Secondary | ICD-10-CM | POA: Diagnosis not present

## 2017-08-18 HISTORY — DX: Personal history of other diseases of urinary system: Z87.448

## 2017-08-18 HISTORY — DX: Unspecified hydronephrosis: N13.30

## 2017-08-18 HISTORY — DX: Benign prostatic hyperplasia without lower urinary tract symptoms: N40.0

## 2017-08-18 HISTORY — DX: Presence of spectacles and contact lenses: Z97.3

## 2017-08-18 HISTORY — DX: Other specified postprocedural states: Z85.820

## 2017-08-18 HISTORY — PX: TRANSURETHRAL RESECTION OF PROSTATE: SHX73

## 2017-08-18 HISTORY — DX: Personal history of urinary calculi: Z87.442

## 2017-08-18 HISTORY — PX: CYSTOSCOPY/URETEROSCOPY/HOLMIUM LASER/STENT PLACEMENT: SHX6546

## 2017-08-18 HISTORY — DX: Chronic kidney disease, stage 3 unspecified: N18.30

## 2017-08-18 HISTORY — DX: Chronic kidney disease, stage 3 (moderate): N18.3

## 2017-08-18 HISTORY — DX: Solitary pulmonary nodule: R91.1

## 2017-08-18 HISTORY — DX: Atherosclerosis of aorta: I70.0

## 2017-08-18 HISTORY — DX: Personal history of other specified conditions: Z87.898

## 2017-08-18 HISTORY — DX: Atherosclerotic heart disease of native coronary artery without angina pectoris: I25.10

## 2017-08-18 HISTORY — DX: Essential tremor: G25.0

## 2017-08-18 HISTORY — DX: Unspecified osteoarthritis, unspecified site: M19.90

## 2017-08-18 HISTORY — DX: Presence of other specified devices: Z97.8

## 2017-08-18 HISTORY — DX: Personal history of malignant melanoma of skin: Z98.890

## 2017-08-18 HISTORY — DX: Personal history of other medical treatment: Z92.89

## 2017-08-18 HISTORY — DX: Presence of external hearing-aid: Z97.4

## 2017-08-18 HISTORY — DX: Nonrheumatic mitral (valve) prolapse: I34.1

## 2017-08-18 HISTORY — DX: Presence of urogenital implants: Z96.0

## 2017-08-18 HISTORY — DX: Bladder-neck obstruction: N32.0

## 2017-08-18 SURGERY — CYSTOSCOPY/URETEROSCOPY/HOLMIUM LASER/STENT PLACEMENT
Anesthesia: General | Site: Urethra | Laterality: Right

## 2017-08-18 MED ORDER — SODIUM CHLORIDE 0.9 % IV SOLN
INTRAVENOUS | Status: DC
  Administered 2017-08-18 (×2): via INTRAVENOUS
  Filled 2017-08-18: qty 1000

## 2017-08-18 MED ORDER — METOPROLOL TARTRATE 25 MG PO TABS
25.0000 mg | ORAL_TABLET | Freq: Every day | ORAL | Status: DC
  Filled 2017-08-18: qty 1

## 2017-08-18 MED ORDER — FLUOXETINE HCL 20 MG PO CAPS
20.0000 mg | ORAL_CAPSULE | Freq: Every day | ORAL | Status: DC
  Administered 2017-08-18: 20 mg via ORAL
  Filled 2017-08-18: qty 1

## 2017-08-18 MED ORDER — SODIUM CHLORIDE 0.45 % IV SOLN
INTRAVENOUS | Status: DC
  Administered 2017-08-18: 23:00:00 via INTRAVENOUS
  Filled 2017-08-18 (×4): qty 1000

## 2017-08-18 MED ORDER — ONDANSETRON HCL 4 MG/2ML IJ SOLN
INTRAMUSCULAR | Status: AC
  Filled 2017-08-18: qty 2

## 2017-08-18 MED ORDER — GLYCOPYRROLATE 0.2 MG/ML IV SOSY
PREFILLED_SYRINGE | INTRAVENOUS | Status: AC
  Filled 2017-08-18: qty 5

## 2017-08-18 MED ORDER — BELLADONNA ALKALOIDS-OPIUM 16.2-60 MG RE SUPP
RECTAL | Status: DC | PRN
  Administered 2017-08-18: 1 via RECTAL

## 2017-08-18 MED ORDER — PROPOFOL 10 MG/ML IV BOLUS
INTRAVENOUS | Status: DC | PRN
Start: 2017-08-18 — End: 2017-08-18
  Administered 2017-08-18: 20 mg via INTRAVENOUS
  Administered 2017-08-18: 150 mg via INTRAVENOUS

## 2017-08-18 MED ORDER — FENTANYL CITRATE (PF) 100 MCG/2ML IJ SOLN
INTRAMUSCULAR | Status: DC | PRN
  Administered 2017-08-18 (×2): 25 ug via INTRAVENOUS
  Administered 2017-08-18: 50 ug via INTRAVENOUS

## 2017-08-18 MED ORDER — SODIUM CHLORIDE 0.9 % IR SOLN
3000.0000 mL | Status: DC
  Filled 2017-08-18: qty 3000

## 2017-08-18 MED ORDER — ZOLPIDEM TARTRATE 5 MG PO TABS
5.0000 mg | ORAL_TABLET | Freq: Every evening | ORAL | Status: DC | PRN
  Administered 2017-08-18: 5 mg via ORAL
  Filled 2017-08-18: qty 1

## 2017-08-18 MED ORDER — PIPERACILLIN-TAZOBACTAM 3.375 G IVPB 30 MIN
3.3750 g | Freq: Four times a day (QID) | INTRAVENOUS | Status: DC
  Filled 2017-08-18 (×5): qty 50

## 2017-08-18 MED ORDER — AMLODIPINE BESYLATE 5 MG PO TABS
5.0000 mg | ORAL_TABLET | Freq: Every morning | ORAL | Status: DC
  Administered 2017-08-18: 5 mg via ORAL
  Filled 2017-08-18: qty 1

## 2017-08-18 MED ORDER — DEXAMETHASONE SODIUM PHOSPHATE 10 MG/ML IJ SOLN
INTRAMUSCULAR | Status: AC
  Filled 2017-08-18: qty 1

## 2017-08-18 MED ORDER — LIDOCAINE HCL URETHRAL/MUCOSAL 2 % EX GEL
CUTANEOUS | Status: DC | PRN
  Administered 2017-08-18: 1

## 2017-08-18 MED ORDER — BELLADONNA ALKALOIDS-OPIUM 16.2-60 MG RE SUPP
1.0000 | Freq: Three times a day (TID) | RECTAL | Status: DC | PRN
  Filled 2017-08-18: qty 1

## 2017-08-18 MED ORDER — SUCCINYLCHOLINE CHLORIDE 200 MG/10ML IV SOSY
PREFILLED_SYRINGE | INTRAVENOUS | Status: AC
  Filled 2017-08-18: qty 10

## 2017-08-18 MED ORDER — SODIUM CHLORIDE 0.9 % IR SOLN
Status: DC | PRN
  Administered 2017-08-18 (×5): 6000 mL via INTRAVESICAL
  Administered 2017-08-18 (×2): 3000 mL via INTRAVESICAL

## 2017-08-18 MED ORDER — ACETAMINOPHEN 500 MG PO TABS
1000.0000 mg | ORAL_TABLET | Freq: Four times a day (QID) | ORAL | Status: DC | PRN
  Filled 2017-08-18: qty 2

## 2017-08-18 MED ORDER — ROSUVASTATIN CALCIUM 20 MG PO TABS
20.0000 mg | ORAL_TABLET | Freq: Every morning | ORAL | Status: DC
  Administered 2017-08-18: 20 mg via ORAL
  Filled 2017-08-18: qty 1

## 2017-08-18 MED ORDER — ADULT MULTIVITAMIN W/MINERALS CH
1.0000 | ORAL_TABLET | Freq: Every day | ORAL | Status: DC
  Filled 2017-08-18: qty 1

## 2017-08-18 MED ORDER — LIDOCAINE 2% (20 MG/ML) 5 ML SYRINGE
INTRAMUSCULAR | Status: DC | PRN
  Administered 2017-08-18: 60 mg via INTRAVENOUS

## 2017-08-18 MED ORDER — MENTHOL 3 MG MT LOZG
LOZENGE | OROMUCOSAL | Status: AC
  Filled 2017-08-18: qty 9

## 2017-08-18 MED ORDER — EPHEDRINE SULFATE-NACL 50-0.9 MG/10ML-% IV SOSY
PREFILLED_SYRINGE | INTRAVENOUS | Status: DC | PRN
  Administered 2017-08-18: 20 mg via INTRAVENOUS
  Administered 2017-08-18 (×3): 10 mg via INTRAVENOUS

## 2017-08-18 MED ORDER — ONDANSETRON HCL 4 MG PO TABS
4.0000 mg | ORAL_TABLET | Freq: Three times a day (TID) | ORAL | Status: DC | PRN
Start: 2017-08-18 — End: 2017-08-19
  Filled 2017-08-18: qty 1

## 2017-08-18 MED ORDER — ASPIRIN 81 MG PO TABS: 81.0000 mg | ORAL_TABLET | Freq: Every day | ORAL | 99 refills | Status: DC

## 2017-08-18 MED ORDER — PANTOPRAZOLE SODIUM 40 MG PO TBEC
40.0000 mg | DELAYED_RELEASE_TABLET | Freq: Two times a day (BID) | ORAL | Status: DC
  Administered 2017-08-18: 40 mg via ORAL
  Filled 2017-08-18 (×2): qty 1

## 2017-08-18 MED ORDER — ARTIFICIAL TEARS OPHTHALMIC OINT
TOPICAL_OINTMENT | OPHTHALMIC | Status: AC
  Filled 2017-08-18: qty 3.5

## 2017-08-18 MED ORDER — LIDOCAINE 2% (20 MG/ML) 5 ML SYRINGE
INTRAMUSCULAR | Status: AC
  Filled 2017-08-18: qty 5

## 2017-08-18 MED ORDER — BELLADONNA ALKALOIDS-OPIUM 16.2-60 MG RE SUPP
RECTAL | Status: AC
  Filled 2017-08-18: qty 1

## 2017-08-18 MED ORDER — FENTANYL CITRATE (PF) 100 MCG/2ML IJ SOLN
INTRAMUSCULAR | Status: AC
  Filled 2017-08-18: qty 2

## 2017-08-18 MED ORDER — ONDANSETRON HCL 4 MG PO TABS
4.0000 mg | ORAL_TABLET | Freq: Three times a day (TID) | ORAL | Status: DC | PRN
  Filled 2017-08-18: qty 1

## 2017-08-18 MED ORDER — TRAMADOL HCL 50 MG PO TABS: 50.0000 mg | ORAL_TABLET | Freq: Four times a day (QID) | ORAL | 0 refills | Status: DC | PRN

## 2017-08-18 MED ORDER — PIPERACILLIN-TAZOBACTAM 3.375 G IVPB 30 MIN
3.3750 g | Freq: Four times a day (QID) | INTRAVENOUS | Status: DC
  Administered 2017-08-18 – 2017-08-19 (×5): 3.375 g via INTRAVENOUS
  Filled 2017-08-18 (×2): qty 50

## 2017-08-18 MED ORDER — IOHEXOL 300 MG/ML  SOLN
INTRAMUSCULAR | Status: DC | PRN
  Administered 2017-08-18: 10 mL via URETHRAL

## 2017-08-18 MED ORDER — TRAMADOL HCL 50 MG PO TABS
50.0000 mg | ORAL_TABLET | Freq: Four times a day (QID) | ORAL | Status: DC | PRN
  Filled 2017-08-18: qty 2

## 2017-08-18 MED ORDER — GLYCOPYRROLATE 0.2 MG/ML IV SOSY
PREFILLED_SYRINGE | INTRAVENOUS | Status: DC | PRN
  Administered 2017-08-18: .2 mg via INTRAVENOUS

## 2017-08-18 MED ORDER — ZOLPIDEM TARTRATE 5 MG PO TABS
ORAL_TABLET | ORAL | Status: AC
  Filled 2017-08-18: qty 1

## 2017-08-18 MED ORDER — FENTANYL CITRATE (PF) 100 MCG/2ML IJ SOLN
25.0000 ug | INTRAMUSCULAR | Status: DC | PRN
  Filled 2017-08-18: qty 1

## 2017-08-18 MED ORDER — DEXAMETHASONE SODIUM PHOSPHATE 10 MG/ML IJ SOLN
INTRAMUSCULAR | Status: DC | PRN
  Administered 2017-08-18: 10 mg via INTRAVENOUS

## 2017-08-18 MED ORDER — HYDROMORPHONE HCL 1 MG/ML IJ SOLN
0.5000 mg | INTRAMUSCULAR | Status: DC | PRN
  Filled 2017-08-18: qty 0.5

## 2017-08-18 MED ORDER — EPHEDRINE 5 MG/ML INJ
INTRAVENOUS | Status: AC
  Filled 2017-08-18: qty 10

## 2017-08-18 MED ORDER — PROPOFOL 10 MG/ML IV BOLUS
INTRAVENOUS | Status: AC
  Filled 2017-08-18: qty 20

## 2017-08-18 MED ORDER — ONDANSETRON HCL 4 MG/2ML IJ SOLN
INTRAMUSCULAR | Status: DC | PRN
  Administered 2017-08-18: 4 mg via INTRAVENOUS

## 2017-08-18 MED ORDER — ACETAMINOPHEN 500 MG PO TABS
1000.0000 mg | ORAL_TABLET | Freq: Every day | ORAL | Status: DC | PRN
  Filled 2017-08-18: qty 2

## 2017-08-18 MED ORDER — PROPOFOL 10 MG/ML IV BOLUS
INTRAVENOUS | Status: AC
  Filled 2017-08-18: qty 40

## 2017-08-18 SURGICAL SUPPLY — 38 items
BAG DRAIN URO-CYSTO SKYTR STRL (DRAIN) ×4 IMPLANT
BAG URINE DRAINAGE (UROLOGICAL SUPPLIES) ×4 IMPLANT
BASKET LASER NITINOL 1.9FR (BASKET) IMPLANT
BASKET STNLS GEMINI 4WIRE 3FR (BASKET) IMPLANT
BASKET ZERO TIP NITINOL 2.4FR (BASKET) IMPLANT
CATH FOLEY 2WAY SLVR 30CC 24FR (CATHETERS) IMPLANT
CATH FOLEY 3WAY 30CC 22FR (CATHETERS) ×4 IMPLANT
CATH URET 5FR 28IN OPEN ENDED (CATHETERS) ×4 IMPLANT
CATH URET DUAL LUMEN 6-10FR 50 (CATHETERS) ×4 IMPLANT
CLOTH BEACON ORANGE TIMEOUT ST (SAFETY) ×4 IMPLANT
ELECT REM PT RETURN 9FT ADLT (ELECTROSURGICAL) ×4
ELECTRODE REM PT RTRN 9FT ADLT (ELECTROSURGICAL) ×2 IMPLANT
EVACUATOR MICROVAS BLADDER (UROLOGICAL SUPPLIES) IMPLANT
EXTRACTOR STONE 1.7FRX115CM (UROLOGICAL SUPPLIES) IMPLANT
FIBER LASER TRAC TIP (UROLOGICAL SUPPLIES) IMPLANT
GLOVE BIO SURGEON STRL SZ7.5 (GLOVE) ×4 IMPLANT
GOWN STRL REUS W/TWL XL LVL3 (GOWN DISPOSABLE) ×4 IMPLANT
GUIDEWIRE 0.038 PTFE COATED (WIRE) IMPLANT
GUIDEWIRE ANG ZIPWIRE 038X150 (WIRE) IMPLANT
GUIDEWIRE STR DUAL SENSOR (WIRE) ×8 IMPLANT
HOLDER FOLEY CATH W/STRAP (MISCELLANEOUS) ×4 IMPLANT
INFUSOR MANOMETER BAG 3000ML (MISCELLANEOUS) IMPLANT
IV NS IRRIG 3000ML ARTHROMATIC (IV SOLUTION) ×52 IMPLANT
KIT BALLIN UROMAX 15FX10 (LABEL) IMPLANT
KIT BALLN UROMAX 15FX4 (MISCELLANEOUS) IMPLANT
KIT BALLN UROMAX 26 75X4 (MISCELLANEOUS)
KIT TURNOVER CYSTO (KITS) ×4 IMPLANT
LOOP CUT BIPOLAR 24F LRG (ELECTROSURGICAL) ×4 IMPLANT
MANIFOLD NEPTUNE II (INSTRUMENTS) ×4 IMPLANT
NS IRRIG 500ML POUR BTL (IV SOLUTION) ×4 IMPLANT
PACK CYSTO (CUSTOM PROCEDURE TRAY) ×4 IMPLANT
SET HIGH PRES BAL DIL (LABEL)
SHEATH ACCESS URETERAL 38CM (SHEATH) IMPLANT
SYR 10ML LL (SYRINGE) ×4 IMPLANT
SYR 30ML LL (SYRINGE) ×4 IMPLANT
SYRINGE IRR TOOMEY STRL 70CC (SYRINGE) ×4 IMPLANT
TUBE CONNECTING 12'X1/4 (SUCTIONS)
TUBE CONNECTING 12X1/4 (SUCTIONS) IMPLANT

## 2017-08-18 NOTE — Anesthesia Preprocedure Evaluation (Addendum)
Anesthesia Evaluation  Patient identified by MRN, date of birth, ID band Patient awake    Reviewed: Allergy & Precautions, NPO status , Patient's Chart, lab work & pertinent test results  Airway Mallampati: II  TM Distance: >3 FB     Dental   Pulmonary former smoker,    breath sounds clear to auscultation       Cardiovascular hypertension, + CAD and + Peripheral Vascular Disease  + Valvular Problems/Murmurs  Rhythm:Regular Rate:Normal     Neuro/Psych    GI/Hepatic Neg liver ROS, GERD  ,  Endo/Other    Renal/GU Renal disease     Musculoskeletal   Abdominal   Peds  Hematology   Anesthesia Other Findings   Reproductive/Obstetrics                             Anesthesia Physical Anesthesia Plan  ASA: III  Anesthesia Plan: General   Post-op Pain Management:    Induction: Intravenous  PONV Risk Score and Plan: Treatment may vary due to age or medical condition, Ondansetron, Dexamethasone and Midazolam  Airway Management Planned: Oral ETT  Additional Equipment:   Intra-op Plan:   Post-operative Plan: Extubation in OR  Informed Consent: I have reviewed the patients History and Physical, chart, labs and discussed the procedure including the risks, benefits and alternatives for the proposed anesthesia with the patient or authorized representative who has indicated his/her understanding and acceptance.   Dental advisory given  Plan Discussed with: CRNA and Anesthesiologist  Anesthesia Plan Comments:        Anesthesia Quick Evaluation

## 2017-08-18 NOTE — Transfer of Care (Addendum)
Last Vitals:  Vitals Value Taken Time  BP 119/64 08/18/2017  9:52 AM  Temp    Pulse 57 08/18/2017  9:54 AM  Resp 16 08/18/2017  9:54 AM  SpO2 95 % 08/18/2017  9:54 AM  Vitals shown include unvalidated device data.  Last Pain:  Vitals:   08/18/17 0544  TempSrc: Oral      Patients Stated Pain Goal: 6 (08/18/17 0602)  Immediate Anesthesia Transfer of Care Note  Patient: Lucas Hardy  Procedure(s) Performed: Procedure(s) (LRB): CYSTOSCOPIY/RIGHT RETROGRADE PYLOGRAM/RIGHT URETEROSCOPY (Right) TRANSURETHRAL RESECTION OF THE PROSTATE (TURP) (N/A)  Patient Location: PACU  Anesthesia Type: General  Level of Consciousness: awake, alert  and oriented  Airway & Oxygen Therapy: Patient Spontanous Breathing and Patient connected to nasal cannula oxygen  Post-op Assessment: Report given to PACU RN and Post -op Vital signs reviewed and stable  Post vital signs: Reviewed and stable  Complications: No apparent anesthesia complications

## 2017-08-18 NOTE — H&P (Signed)
Acute urinary retention  HPI: Lucas Hardy is a 72 year-old male established patient who is here for further eval and management of urinary retention.  It was first diagnosed approximately 05/31/2017.   He currently has an indwelling catheter. The catheter was last changed 3 weeks. His urinary retention is being treated with foley catheter.   He does not have a good size and strength to his urinary stream. Prior to this episode, he had to bear down/strain to get his stream started. He is having problems with emptying his bladder well. He is having problems with urinary control or incontinence.   His current symptoms are not related to undergoing a surgical procedure. The patient has not had prostate surgery in the past.   Patient initially presented with worsening renal function and bilateral hydro. A catheter was placed at his initial visit and his bladder drained 2.8 liters. His creatinine was elevated at 2.1. Repeat u/s of his kidneys showed resolution of his hydro and his repeat labs demonstrated a improvement in his renal function with creatinine of 1.2.   Interval: The patient was admitted for bacteremia and pyelonephritis for ESBL Escherichia coli. Because he had right-sided hydro-a stent was placed during last admission. He was given 5 days of IV ertapenem and then subsequently started on ciprofloxacin. Since being on ciprofloxacin he has had persistent nausea and decreased appetite. He presents today to discuss the side effects and any additional treatment options. He has been afebrile and his pain is gone. However, he does feel weak and persistently nauseous. He is not taking Zofran or any other antiemetic for his nausea. He also struggles with GERD, and has not been taking his Protonix.   Presented yesterday to clinic with fevers/flank pain. Was started on Bactrim empirically.   06/21/17: Patient continues to have high fever, nausea, vomiting and R flank Pain. Pt wife doesn't believe its a  virus. Pt is vomiting Bile, can not hold anything down, Balance is off, PT feels weak and Fatigued. Fever has been 104 to 103 and having chills since Saturday, minor confusion and dizzness. In addition, the patient has had some nausea and vomiting. He noted some blood in his catheter Saturday. He feels weak. He feels like this morning he was getting better, however he continues to spike fevers.   06/22/17: Patient returns today with progressive symptoms. He was empirically started on Bactrim on Monday with urine culture still pending. He continues to complain of general malaise, lethargy, dizziness, nausea, and vomiting today. He has been able to tolerate minimal PO intake. Flank pain and suprapubic pain seems to be improving, however. He continues to spike high fevers, with temperature via oral measurement of 104. He has been using both Ibuprofen and Tylenol for management. Currently afebrile. His catheter continues to drain well with intermittent hematuria. He was seen by his PCP yesterday and flu swab was negative.   06/30/17 patient was seen for a drug reaction to ciprofloxacin. He was switched to Bactrim and he tolerated this well.   Interval: The patient presents today after undergoing urodynamics several weeks ago demonstrating some definite bladder function and evidence of obstruction and trabeculation. The patient's bladder capacity was 660 cc, significant improvement since his catheter was initially placed with 2.8 L. The patient was unable to void. The catheter is placed at that time. This was on 07/04/17.     ALLERGIES: Ciprofloxacin - Nausea, Vomiting None    MEDICATIONS: Crestor  Metoprolol Succinate  Prilosec  Amlodipine Besilate  Flomax  Fluoxetine Hcl  Zofran 4 mg tablet 1 tablet PO Q 8 H PRN     GU PSH: Catheterize For Residual - 06/10/2017 Complex cystometrogram, w/ void pressure and urethral pressure profile studies, any technique - 07/04/2017 Complex Uroflow -  07/04/2017 Cystoscopy Insert Stent, Right - 06/22/2017 Emg surf Electrd - 07/04/2017 Inject For cystogram - 07/04/2017 Intrabd voidng Press - 07/04/2017      PSH Notes: index finger- remove portion   NON-GU PSH: Brain Surgery (Unspecified) Elbow Arthroscopy, Left    GU PMH: Urinary Retention, Unspec - 06/30/2017, - 06/22/2017, - 06/21/2017 Pyelonephritis (Stable) - 06/21/2017, - 06/20/2017 Acute Cystitis/UTI - 06/20/2017 Flank Pain - 06/20/2017 Hydronephrosis Unspec - 06/20/2017 Acute kidney failure - 06/10/2017 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   Tobacco Use Assessment Completed: Used Tobacco in last 30 days? Does not drink anymore.  Drinks 2 caffeinated drinks per day.    REVIEW OF SYSTEMS:    GU Review Male:   Patient denies have to strain to urinate , burning/ pain with urination, get up at night to urinate, hard to postpone urination, trouble starting your stream, penile pain, erection problems, stream starts and stops, leakage of urine, and frequent urination.  Gastrointestinal (Upper):   Patient denies nausea, vomiting, and indigestion/ heartburn.  Gastrointestinal (Lower):   Patient denies diarrhea and constipation.  Constitutional:   Patient denies fever, night sweats, weight loss, and fatigue.  Skin:   Patient denies skin rash/ lesion and itching.  Eyes:   Patient denies blurred vision and double vision.  Ears/ Nose/ Throat:   Patient denies sore throat and sinus problems.  Hematologic/Lymphatic:   Patient denies swollen  glands and easy bruising.  Cardiovascular:   Patient denies leg swelling and chest pains.  Respiratory:   Patient denies cough and shortness of breath.  Endocrine:   Patient denies excessive thirst.  Musculoskeletal:   Patient denies back pain and joint pain.  Neurological:   Patient reports dizziness. Patient denies headaches.  Psychologic:   Patient denies depression and anxiety.   VITAL SIGNS:      07/25/2017 02:52 PM  Weight 190 lb / 86.18 kg  Height 71 in / 180.34 cm  BP 97/59 mmHg  Heart Rate 54 /min  Temperature 98.3 F / 36.8 C  BMI 26.5 kg/m   PAST DATA REVIEWED:  Source Of History:  Patient  Lab Test Review:   PSA  Records Review:   Previous Doctor Records, Previous Patient Records, POC Tool  X-Ray Review: C.T. Abdomen/Pelvis: Reviewed Films. Discussed With Patient.    Notes:                     The patient's last PSA was checked in 11/2016:2.27   PROCEDURES:          Catheter / SP Tube - 51702 Simple Foley Indwelling Cath Change  The patient's indwelling foley tube was carefully removed. A 16 French Foley catheter was inserted into the bladder using sterile technique. The patient was taught routine catheter care. A leg bag was connected.   ASSESSMENT:  ICD-10 Details  1 GU:   Urinary Retention, Unspec - R33.9      PLAN:           Document Letter(s):  Created for Patient: Clinical Summary         Notes:   The patient has an obstructing prostate which has created urinary retention. The patient's urodynamics after a month of catheterization demonstrated a normal capacity bladder with some detrusor function. However, due to the degree of obstruction he was unable to void the catheters placed.   The patient also has a stent in his right ureter because of some residual hydroureteronephrosis and question of whether he had an obstructing stone in the right mid ureter.   Our plan is to move forward with right ureteroscopy and possible laser lithotripsy and stent  exchange versus removal. We will also simultaneously perform a TURP. I discussed the surgery with the patient in significant detail. He understands the various steps of the operation and the expected outcome. He also understands that he'll be in the hospital is 190. We will give him a voiding trial on postop day #1 which he has a 50% chance of passing. If he fails, we'll then repeat the voiding trial in the office. Given that he does have some detrusor function I feel confident that ultimately he will pass the trial. He will continue tamsulosin until that time.   We changed the patient's Foley catheter today and we'll try to get him scheduled for the surgery as soon as possible. I don't think he needs medical clearance given his otherwise good health.   Cc: Dr. Mertha Finders, M.D.

## 2017-08-18 NOTE — Anesthesia Postprocedure Evaluation (Signed)
Anesthesia Post Note  Patient: Lucas Hardy  Procedure(s) Performed: CYSTOSCOPIY/RIGHT RETROGRADE PYLOGRAM/RIGHT URETEROSCOPY (Right Ureter) TRANSURETHRAL RESECTION OF THE PROSTATE (TURP) (N/A Urethra)     Patient location during evaluation: PACU Anesthesia Type: General Level of consciousness: awake Pain management: pain level controlled Vital Signs Assessment: post-procedure vital signs reviewed and stable Respiratory status: spontaneous breathing Cardiovascular status: stable Anesthetic complications: no    Last Vitals:  Vitals:   08/18/17 1000 08/18/17 1015  BP:  122/69  Pulse: (!) 58 (!) 55  Resp: 15 13  Temp:    SpO2: 95% 96%    Last Pain:  Vitals:   08/18/17 1030  TempSrc:   PainSc: 0-No pain                 Tafari Humiston

## 2017-08-18 NOTE — Anesthesia Procedure Notes (Signed)
Procedure Name: LMA Insertion Date/Time: 08/18/2017 7:42 AM Performed by: Belinda Block, MD Pre-anesthesia Checklist: Patient identified, Emergency Drugs available, Suction available and Patient being monitored Patient Re-evaluated:Patient Re-evaluated prior to induction Oxygen Delivery Method: Circle system utilized Preoxygenation: Pre-oxygenation with 100% oxygen Induction Type: IV induction Ventilation: Mask ventilation without difficulty LMA: LMA inserted LMA Size: 4.0 Number of attempts: 1 Airway Equipment and Method: Bite block Placement Confirmation: positive ETCO2 Tube secured with: Tape Dental Injury: Teeth and Oropharynx as per pre-operative assessment

## 2017-08-18 NOTE — Op Note (Signed)
Preoperative diagnosis:  1. Bladder outlet obstruction 2. Urinary retention 3. Right hydronephrosis  Postoperative diagnosis:  1. same   Procedure:  1. Transurethral resection of prostate 2. Right retrograde pyelogram with interpretation and stent removal 3. Right diagnostic ureteroscopy  Surgeon: Ardis Hughs, MD   Anesthesia: General   Complications: None   Intraoperative findings: #1: The retrograde pyelogram was performed once the stent had been removed and a dual-lumen catheter placed in the distal aspect of the patient's right ureter.  10 cc of Omnipaque contrast was instilled and demonstrated a normal contour ureter with no significant filling defect or residual hydroureteronephrosis.  The calyces were seemingly sharp.  There was brisk drainage of the contrast. #2: Ureteroscopy demonstrated an edematous ureter with no evidence of kidney stones, urothelial lesions, or abnormalities.  The hydroureteronephrosis was likely secondary to urinary obstruction, and the calcification noted on the CT scan was likely phlebolith and not an obstructing ureteral stone. #3: Cystoscopy demonstrated orthotopic ureters.  There was significant trabeculation and small cellules throughout the bladder consistent with chronic obstruction. #4: The patient had a large median lobe with bilateral lobar hypertrophy.  EBL: 100cc   Specimens: Prostate chips  Indication: Lucas Hardy is a 72 y.o. patient with bladder outlet obstruction/urinary retention/obstructive voiding symptoms.  After reviewing the management options for treatment, he elected to proceed with the above surgical procedure(s). We have discussed the potential benefits and risks of the procedure, side effects of the proposed treatment, the likelihood of the patient achieving the goals of the procedure, and any potential problems that might occur during the procedure or recuperation. Informed consent has been obtained.  Description of  procedure:  The patient was taken to the operating room and general anesthesia was induced. The patient was placed in the dorsal lithotomy position, prepped and draped in the usual sterile fashion, and preoperative antibiotics were administered. A preoperative time-out was performed.   I then gently passed the 21 French 30 cystoscope into the patient's urethra and up into the bladder under visual guidance. A 360 cystoscopic evaluation was then performed with the above findings.  I then pulled the stent emanating from the patient's right ureteral orifice down to the urethral meatus using a stent grasper.  I did wire at the stent with a 0.38 sensor wire and remove the stent over the wire.  I then advanced a dual-lumen catheter over the wire and into the distal ureter.  I performed a retrograde pyelogram with the above findings.  I then inserted a second wire through the dual-lumen catheter removing the catheter over the wire.  I then advanced a flexible ureteroscope over the wire and into the distal ureter.  At this point I remove the wire and perform diagnostic ureteroscopy with the above findings.  Noting no significant abnormalities I removed the ureteroscope.  I then reinserted the dual-lumen catheter over the safety wire and again performed a retrograde pyelogram to assess for drainage.  I remove the catheter in the wire and the contrast briskly drained.  I then advanced a 68 French cystoscope with a 5 French resectoscope sheath through the urethra and into the bladder using the visual obturator under direct vision.  I then exchange the obturator for the resectoscope itself and the loop cautery element. I then proceeded to create grooves within the prostate at the 7:00 position extending the defect from the bladder neck at 7:00 down to the prostate apex just lateral to the verumontanum. I took this groove down to  the capsule. I then performed a similar maneuver on the patient's left side at the 5:00  position taking the prostate down from the bladder neck to the apex and down the prostatic capsule. At this point I proceeded to resect the patient's right lateral lobe in a systematic fashion moving from the 7:00 position up to approximately 11. The resection was taken down to the prostate capsule. Hemostasis was achieved on this side prior to moving to the patient's left lateral lobe.  I then turned my attention to the patient's median lobe and resected that in its entirety from the bladder neck down to the verumontanum.  A similar sequence was performed taking the prostate down from the 5:00 position to approximately the 1:00 position to the level of the prostatic capsule. I then completed the resection of the posterior wall as well as the area between 5:00 and 7:00 along the bladder neck. I was careful not to undermine the bladder neck or resect the inferior. I then did resect the area that was protruding into the prostatic urethra anteriorly.   Once I was satisfied that the prostate had been adequately resected I evacuated the prostate chips using a [place] syringe. I then reintroduced the resectoscope and ensured adequate hemostasis. I then left the bladder full and removed the resectoscope entirely. Exam under anesthesia demonstrated a normal sized prostate with no nodules.  I then placed a 22 French three-way Foley catheter. I irrigated the catheter gently and removed all debris and clots. I then placed the patient on gentle continuous bladder irrigation.  He subsequently extubated and returned to PACU in excellent condition.  Ardis Hughs, MD

## 2017-08-19 ENCOUNTER — Encounter (HOSPITAL_BASED_OUTPATIENT_CLINIC_OR_DEPARTMENT_OTHER): Payer: Self-pay | Admitting: Urology

## 2017-08-19 DIAGNOSIS — N401 Enlarged prostate with lower urinary tract symptoms: Secondary | ICD-10-CM | POA: Diagnosis not present

## 2017-08-19 LAB — BASIC METABOLIC PANEL
Anion gap: 10 (ref 5–15)
BUN: 20 mg/dL (ref 6–20)
CO2: 22 mmol/L (ref 22–32)
Calcium: 8.7 mg/dL — ABNORMAL LOW (ref 8.9–10.3)
Chloride: 104 mmol/L (ref 101–111)
Creatinine, Ser: 1.38 mg/dL — ABNORMAL HIGH (ref 0.61–1.24)
GFR, EST AFRICAN AMERICAN: 58 mL/min — AB (ref >60)
GFR, EST NON AFRICAN AMERICAN: 50 mL/min — AB (ref >60)
Glucose, Bld: 147 mg/dL — ABNORMAL HIGH (ref 65–99)
POTASSIUM: 4 mmol/L (ref 3.5–5.1)
SODIUM: 136 mmol/L (ref 135–145)

## 2017-08-19 LAB — CBC
HCT: 30.5 % — ABNORMAL LOW (ref 39.0–52.0)
Hemoglobin: 10.1 g/dL — ABNORMAL LOW (ref 13.0–17.0)
MCH: 29.5 pg (ref 26.0–34.0)
MCHC: 33.1 g/dL (ref 30.0–36.0)
MCV: 89.2 fL (ref 78.0–100.0)
PLATELETS: 274 10*3/uL (ref 150–400)
RBC: 3.42 MIL/uL — AB (ref 4.22–5.81)
RDW: 13.8 % (ref 11.5–15.5)
WBC: 12.8 10*3/uL — AB (ref 4.0–10.5)

## 2017-08-19 NOTE — Discharge Instructions (Signed)
Transurethral Resection of the Prostate (TURP) or Greenlight laser ablation of the Prostate  Care After  Refer to this sheet in the next few weeks. These discharge instructions provide you with general information on caring for yourself after you leave the hospital. Your caregiver may also give you specific instructions. Your treatment has been planned according to the most current medical practices available, but unavoidable complications sometimes occur. If you have any problems or questions after discharge, please call your caregiver.  HOME CARE INSTRUCTIONS   Medications  You may receive medicine for pain management. As your level of discomfort decreases, adjustments in your pain medicines may be made.   Take all medicines as directed.   You may be given a medicine (antibiotic) to kill germs following surgery. Finish all medicines. Let your caregiver know if you have any side effects or problems from the medicine.   If you are on aspirin, it would be best not to restart the aspirin until the blood in the urine clears Hygiene  You can take a shower after surgery.   You should not take a bath while you still have the urethral catheter. Activity  You will be encouraged to get out of bed as much as possible and increase your activity level as tolerated.   Spend the first week in and around your home. For 3 weeks, avoid the following:   Straining.   Running.   Strenuous work.   Walks longer than a few blocks.   Riding for extended periods.   Sexual relations.   Do not lift heavy objects (more than 20 pounds) for at least 1 month. When lifting, use your arms instead of your abdominal muscles.   You will be encouraged to walk as tolerated. Do not exert yourself. Increase your activity level slowly. Remember that it is important to keep moving after an operation of any type. This cuts down on the possibility of developing blood clots.   Your caregiver will tell you when you  can resume driving and light housework. Discuss this at your first office visit after discharge. Diet  No special diet is ordered after a TURP. However, if you are on a special diet for another medical problem, it should be continued.   Normal fluid intake is usually recommended.   Avoid alcohol and caffeinated drinks for 2 weeks. They irritate the bladder. Decaffeinated drinks are okay.   Avoid spicy foods.  Bladder Function  For the first 10 days, empty the bladder whenever you feel a definite desire. Do not try to hold the urine for long periods of time.   Urinating once or twice a night even after you are healed is not uncommon.   You may see some recurrence of blood in the urine after discharge from the hospital. This usually happens within 2 weeks after the procedure.If this occurs, force fluids again as you did in the hospital and reduce your activity.   If unable to urinate by 2:30 pm today, report to clinic. Bowel Function  You may experience some constipation after surgery. This can be minimized by increasing fluids and fiber in your diet. Drink enough water and fluids to keep your urine clear or pale yellow.   A stool softener may be prescribed for use at home. Do not strain to move your bowels.   If you are requiring increased pain medicine, it is important that you take stool softeners to prevent constipation. This will help to promote proper healing by reducing the need  to strain to move your bowels.  Sexual Activity  Semen movement in the opposite direction and into the bladder (retrograde ejaculation) may occur. Since the semen passes into the bladder, cloudy urine can occur the first time you urinate after intercourse. Or, you may not have an ejaculation during erection. Ask your caregiver when you can resume sexual activity. Retrograde ejaculation and reduced semen discharge should not reduce one's pleasure of intercourse.  Postoperative Visit  Arrange the date and  time of your after surgery visit with your caregiver.  Return to Work  After your recovery is complete, you will be able to return to work and resume all activities. Your caregiver will inform you when you can return to work.    Foley Catheter Care A soft, flexible tube (Foley catheter) may have been placed in your bladder to drain urine and fluid. Follow these instructions: Taking Care of the Catheter  Keep the area where the catheter leaves your body clean.   Attach the catheter to the leg so there is no tension on the catheter.   Keep the drainage bag below the level of the bladder, but keep it OFF the floor.   Do not take long soaking baths. Your caregiver will give instructions about showering.   Wash your hands before touching ANYTHING related to the catheter or bag.   Using mild soap and warm water on a washcloth:   Clean the area closest to the catheter insertion site using a circular motion around the catheter.   Clean the catheter itself by wiping AWAY from the insertion site for several inches down the tube.   NEVER wipe upward as this could sweep bacteria up into the urethra (tube in your body that normally drains the bladder) and cause infection.   Place a small amount of sterile lubricant at the tip of the penis where the catheter is entering.  Taking Care of the Drainage Bags  Two drainage bags may be taken home: a large overnight drainage bag, and a smaller leg bag which fits underneath clothing.   It is okay to wear the overnight bag at any time, but NEVER wear the smaller leg bag at night.   Keep the drainage bag well below the level of your bladder. This prevents backflow of urine into the bladder and allows the urine to drain freely.   Anchor the tubing to your leg to prevent pulling or tension on the catheter. Use tape or a leg strap provided by the hospital.   Empty the drainage bag when it is 1/2 to 3/4 full. Wash your hands before and after touching the  bag.   Periodically check the tubing for kinks to make sure there is no pressure on the tubing which could restrict the flow of urine.  Changing the Drainage Bags  Cleanse both ends of the clean bag with alcohol before changing.   Pinch off the rubber catheter to avoid urine spillage during the disconnection.   Disconnect the dirty bag and connect the clean one.   Empty the dirty bag carefully to avoid a urine spill.   Attach the new bag to the leg with tape or a leg strap.  Cleaning the Drainage Bags  Whenever a drainage bag is disconnected, it must be cleaned quickly so it is ready for the next use.   Wash the bag in warm, soapy water.   Rinse the bag thoroughly with warm water.   Soak the bag for 30 minutes in a solution  of white vinegar and water (1 cup vinegar to 1 quart warm water).   Rinse with warm water.  SEEK MEDICAL CARE IF:   You have chills or night sweats.   You are leaking around your catheter or have problems with your catheter. It is not uncommon to have sporadic leakage around your catheter as a result of bladder spasms. If the leakage stops, there is not much need for concern. If you are uncertain, call your caregiver.   You develop side effects that you think are coming from your medicines.  SEEK IMMEDIATE MEDICAL CARE IF:   You are suddenly unable to urinate. Check to see if there are any kinks in the drainage tubing that may cause this. If you cannot find any kinks, call your caregiver immediately. This is an emergency.   You develop shortness of breath or chest pains.   Bleeding persists or clots develop in your urine.   You have a fever.   You develop pain in your back or over your lower belly (abdomen).   You develop pain or swelling in your legs.   Any problems you are having get worse rather than better.  MAKE SURE YOU:   Understand these instructions.   Will watch your condition.   Will get help right away if you are not doing well or  get worse.

## 2017-08-19 NOTE — Progress Notes (Signed)
Information reported to Dr. Louis Meckel: Patient had 2 voids totaling 50 cc approximately 30 minutes ago and 1 void of about 10 cc within the last 5 minute with bladder scan revealing >20 cc in all quadrants after last void.  Okay to send patient home without foley catheter and have him come back to clinic if he has been unable to void by 2:30 pm.  Patient and his wife informed of this.  Encouraged them to write down amount that he drinks and urinal to take home to measure amount of urine voided.

## 2017-08-20 NOTE — Discharge Summary (Signed)
Date of admission: 08/18/2017  Date of discharge: 08/20/2017  Admission diagnosis: right hydronephrosis, bladder outlet obstruction with urinary retention  Discharge diagnosis: Same  Secondary diagnoses:  Patient Active Problem List   Diagnosis Date Noted  . BPH with obstruction/lower urinary tract symptoms 08/18/2017  . Malnutrition of moderate degree 08/01/2017  . Pyelonephritis 07/29/2017  . UTI (urinary tract infection) 06/22/2017  . Chest pain 05/30/2017  . Depression 05/12/2013  . Insomnia 05/12/2013  . RLS (restless legs syndrome) 05/12/2013  . BPH (benign prostatic hyperplasia) 05/12/2013  . Mixed hyperlipidemia 05/12/2013    Procedures performed: Procedure(s): CYSTOSCOPIY/RIGHT RETROGRADE PYLOGRAM/RIGHT URETEROSCOPY TRANSURETHRAL RESECTION OF THE PROSTATE (TURP)  History and Physical: For full details, please see admission history and physical. Briefly, Lucas Hardy is a 72 y.o. year old patient with history of urinary retention with bilateral hydroureteronephrosis.  About a month ago he presented to the hospital with fevers and was noted to have residual right-sided hydronephrosis with a phlebolith the perirenal area in the mid section.  He was taken to the OR and a stent was placed.  Ultimately he was treated with antibiotics and improved.  He presented today for TURP and right diagnostic ureteroscopy.Marland Kitchen   Hospital Course: Patient tolerated the procedure well.  He was then transferred to the floor after an uneventful PACU stay.  His hospital course was uncomplicated.  On POD#1 he had met discharge criteria: was eating a regular diet, was up and ambulating independently,  pain was well controlled, was voiding without a catheter, and was ready to for discharge.  His catheter was removed on the morning of postop day #1.  He had not voided much, but was able to void with minimal postvoid residuals.  Exam Blood pressure 126/74, pulse (!) 56, temperature (!) 97.5 F (36.4 C),  temperature source Axillary, resp. rate 16, height '5\' 10"'  (1.778 m), weight 82 kg (180 lb 11.2 oz), SpO2 96 %.  05/17 0701 - 05/18 0700 In: 1380 [P.O.:480; I.V.:900] Out: 150 [Urine:150]   NAD Resting comfortably, nonlabored breathing Abdomen is soft, nontender Foley catheter has been removed   Laboratory values:  Recent Labs    08/19/17 0551  WBC 12.8*  HGB 10.1*  HCT 30.5*   Recent Labs    08/19/17 0551  NA 136  K 4.0  CL 104  CO2 22  GLUCOSE 147*  BUN 20  CREATININE 1.38*  CALCIUM 8.7*   No results for input(s): LABPT, INR in the last 72 hours. No results for input(s): LABURIN in the last 72 hours. Results for orders placed or performed during the hospital encounter of 07/29/17  Blood culture (routine x 2)     Status: None   Collection Time: 07/29/17  9:29 PM  Result Value Ref Range Status   Specimen Description   Final    BLOOD LEFT ANTECUBITAL Performed at [facility] [address], [place], [place] [address]    Special Requests   Final    BOTTLES DRAWN AEROBIC AND ANAEROBIC Blood Culture adequate volume Performed at [facility] [address] [place], [place] [address]    Culture   Final    NO GROWTH 5 DAYS Performed at [facility], [place] [address] [place], [place] [address]    Report Status 08/04/2017 FINAL  Final  Urine Culture     Status: None   Collection Time: 07/29/17  9:45 PM  Result Value Ref Range Status   Specimen Description   Final  URINE, RANDOM Performed at [facility], [place] [address], [place], [place] [address]    Special Requests   Final    NONE Performed at [facility], [place] [address] [place], [place] [address]    Culture   Final    NO GROWTH Performed at [facility], [place] [address] [place], [place] [address]    Report Status 07/31/2017 FINAL  Final  Blood culture (routine x 2)     Status: None   Collection Time: 07/29/17  11:49 PM  Result Value Ref Range Status   Specimen Description   Final    BLOOD RIGHT ANTECUBITAL Performed at [facility] [address], [place], [place] [address]    Special Requests   Final    BOTTLES DRAWN AEROBIC AND ANAEROBIC Blood Culture adequate volume Performed at [facility] [address], [place], [place] [address]    Culture   Final    NO GROWTH 5 DAYS Performed at [facility], [place] [address], [place], [place] [address]    Report Status 08/04/2017 FINAL  Final    Disposition: Home  Discharge instruction: The patient was instructed to be ambulatory but told to refrain from heavy lifting, strenuous activity, or driving.   Discharge medications:  Allergies as of 08/19/2017      Reactions   Effexor [venlafaxine]    Anxiety   Ciprofloxacin Nausea And Vomiting   Tolerated levoquin for 2 weeks as outpatient      Medication List    STOP taking these medications   levofloxacin 750 MG tablet Commonly known as:  LEVAQUIN     TAKE these medications   acetaminophen 500 MG tablet Commonly known as:  TYLENOL Take 1,000 mg by mouth daily as needed for fever.   amLODipine 5 MG tablet Commonly known as:  NORVASC Take 5 mg by mouth every morning.   aspirin 81 MG tablet Take 1 tablet (81 mg total) by mouth daily. Resume once there is no blood in your urine for 48 hours What changed:  additional instructions   Cholecalciferol 2000 units Caps Take 2,000 Units by mouth daily.   FLUoxetine 20 MG capsule Commonly known as:  PROZAC Take 20 mg by mouth daily.   metoprolol succinate 25 MG 24 hr tablet Commonly known as:  TOPROL XL Take 0.5 tablets (12.5 mg total) by mouth daily. What changed:    how much to take  when to take this   multivitamin with minerals Tabs tablet Take 1 tablet by mouth daily.   neomycin-polymyxin-pramoxine 1 % cream Commonly known as:  NEOSPORIN PLUS Apply 1 application topically daily.  APPLY TO UPPER LIP AND CATHETER   ondansetron 4 MG tablet Commonly known as:  ZOFRAN Take 1 tablet (4 mg total) by mouth every 8 (eight) hours as needed for nausea.   pantoprazole 40 MG tablet Commonly known as:  PROTONIX Take 1 tablet (40 mg total) by mouth 2 (two) times daily.   rosuvastatin 20 MG tablet Commonly known as:  CRESTOR Take 20 mg by mouth every morning.   traMADol 50 MG tablet Commonly known as:  ULTRAM Take 1-2 tablets (50-100 mg total) by mouth every 6 (six) hours as needed for moderate pain.       Followup:  Follow-up Information    Ardis Hughs, MD On 09/02/2017.   Specialty:  Urology Why:  3:45pm Contact information: [place] [address] [phone]

## 2017-08-22 DIAGNOSIS — N401 Enlarged prostate with lower urinary tract symptoms: Secondary | ICD-10-CM | POA: Diagnosis not present

## 2017-08-22 DIAGNOSIS — R339 Retention of urine, unspecified: Secondary | ICD-10-CM | POA: Diagnosis not present

## 2017-08-22 DIAGNOSIS — R8279 Other abnormal findings on microbiological examination of urine: Secondary | ICD-10-CM | POA: Diagnosis not present

## 2017-09-02 DIAGNOSIS — R339 Retention of urine, unspecified: Secondary | ICD-10-CM | POA: Diagnosis not present

## 2017-09-02 DIAGNOSIS — R338 Other retention of urine: Secondary | ICD-10-CM | POA: Diagnosis not present

## 2017-09-02 DIAGNOSIS — N401 Enlarged prostate with lower urinary tract symptoms: Secondary | ICD-10-CM | POA: Diagnosis not present

## 2017-09-07 DIAGNOSIS — N1330 Unspecified hydronephrosis: Secondary | ICD-10-CM | POA: Diagnosis not present

## 2017-09-07 DIAGNOSIS — R609 Edema, unspecified: Secondary | ICD-10-CM | POA: Diagnosis not present

## 2017-09-07 DIAGNOSIS — N400 Enlarged prostate without lower urinary tract symptoms: Secondary | ICD-10-CM | POA: Diagnosis not present

## 2017-09-07 DIAGNOSIS — I10 Essential (primary) hypertension: Secondary | ICD-10-CM | POA: Diagnosis not present

## 2017-11-16 DIAGNOSIS — M7541 Impingement syndrome of right shoulder: Secondary | ICD-10-CM | POA: Diagnosis not present

## 2017-11-16 DIAGNOSIS — M19011 Primary osteoarthritis, right shoulder: Secondary | ICD-10-CM | POA: Diagnosis not present

## 2017-11-16 DIAGNOSIS — M25511 Pain in right shoulder: Secondary | ICD-10-CM | POA: Insufficient documentation

## 2017-12-06 DIAGNOSIS — Z85820 Personal history of malignant melanoma of skin: Secondary | ICD-10-CM | POA: Diagnosis not present

## 2017-12-06 DIAGNOSIS — L821 Other seborrheic keratosis: Secondary | ICD-10-CM | POA: Diagnosis not present

## 2017-12-06 DIAGNOSIS — D225 Melanocytic nevi of trunk: Secondary | ICD-10-CM | POA: Diagnosis not present

## 2017-12-06 DIAGNOSIS — D1801 Hemangioma of skin and subcutaneous tissue: Secondary | ICD-10-CM | POA: Diagnosis not present

## 2017-12-07 DIAGNOSIS — F329 Major depressive disorder, single episode, unspecified: Secondary | ICD-10-CM | POA: Diagnosis not present

## 2017-12-07 DIAGNOSIS — F324 Major depressive disorder, single episode, in partial remission: Secondary | ICD-10-CM | POA: Diagnosis not present

## 2017-12-07 DIAGNOSIS — Z1389 Encounter for screening for other disorder: Secondary | ICD-10-CM | POA: Diagnosis not present

## 2017-12-07 DIAGNOSIS — I10 Essential (primary) hypertension: Secondary | ICD-10-CM | POA: Diagnosis not present

## 2017-12-07 DIAGNOSIS — Z23 Encounter for immunization: Secondary | ICD-10-CM | POA: Diagnosis not present

## 2017-12-07 DIAGNOSIS — Z79899 Other long term (current) drug therapy: Secondary | ICD-10-CM | POA: Diagnosis not present

## 2017-12-07 DIAGNOSIS — N183 Chronic kidney disease, stage 3 (moderate): Secondary | ICD-10-CM | POA: Diagnosis not present

## 2017-12-07 DIAGNOSIS — D649 Anemia, unspecified: Secondary | ICD-10-CM | POA: Diagnosis not present

## 2017-12-07 DIAGNOSIS — E559 Vitamin D deficiency, unspecified: Secondary | ICD-10-CM | POA: Diagnosis not present

## 2017-12-07 DIAGNOSIS — N400 Enlarged prostate without lower urinary tract symptoms: Secondary | ICD-10-CM | POA: Diagnosis not present

## 2017-12-07 DIAGNOSIS — Z0000 Encounter for general adult medical examination without abnormal findings: Secondary | ICD-10-CM | POA: Diagnosis not present

## 2017-12-22 DIAGNOSIS — E559 Vitamin D deficiency, unspecified: Secondary | ICD-10-CM | POA: Diagnosis not present

## 2017-12-22 DIAGNOSIS — I10 Essential (primary) hypertension: Secondary | ICD-10-CM | POA: Diagnosis not present

## 2017-12-22 DIAGNOSIS — R21 Rash and other nonspecific skin eruption: Secondary | ICD-10-CM | POA: Diagnosis not present

## 2017-12-22 DIAGNOSIS — Z79899 Other long term (current) drug therapy: Secondary | ICD-10-CM | POA: Diagnosis not present

## 2017-12-22 DIAGNOSIS — S51851A Open bite of right forearm, initial encounter: Secondary | ICD-10-CM | POA: Diagnosis not present

## 2018-01-02 DIAGNOSIS — N401 Enlarged prostate with lower urinary tract symptoms: Secondary | ICD-10-CM | POA: Diagnosis not present

## 2018-01-02 DIAGNOSIS — D649 Anemia, unspecified: Secondary | ICD-10-CM | POA: Diagnosis not present

## 2018-01-02 DIAGNOSIS — N400 Enlarged prostate without lower urinary tract symptoms: Secondary | ICD-10-CM | POA: Diagnosis not present

## 2018-01-02 DIAGNOSIS — N183 Chronic kidney disease, stage 3 (moderate): Secondary | ICD-10-CM | POA: Diagnosis not present

## 2018-01-02 DIAGNOSIS — E782 Mixed hyperlipidemia: Secondary | ICD-10-CM | POA: Diagnosis not present

## 2018-01-02 DIAGNOSIS — F324 Major depressive disorder, single episode, in partial remission: Secondary | ICD-10-CM | POA: Diagnosis not present

## 2018-01-02 DIAGNOSIS — I10 Essential (primary) hypertension: Secondary | ICD-10-CM | POA: Diagnosis not present

## 2018-01-19 ENCOUNTER — Other Ambulatory Visit: Payer: Self-pay | Admitting: Nurse Practitioner

## 2018-01-19 ENCOUNTER — Ambulatory Visit
Admission: RE | Admit: 2018-01-19 | Discharge: 2018-01-19 | Disposition: A | Discharge status: Home / Self Care | Payer: PPO | Source: Ambulatory Visit | Attending: Nurse Practitioner | Admitting: Nurse Practitioner

## 2018-01-19 DIAGNOSIS — M79671 Pain in right foot: Secondary | ICD-10-CM

## 2018-01-19 DIAGNOSIS — S92514A Nondisplaced fracture of proximal phalanx of right lesser toe(s), initial encounter for closed fracture: Secondary | ICD-10-CM | POA: Diagnosis not present

## 2018-01-21 ENCOUNTER — Ambulatory Visit: Payer: PPO

## 2018-01-21 ENCOUNTER — Ambulatory Visit: Payer: PPO | Admitting: Podiatry

## 2018-01-21 ENCOUNTER — Encounter: Payer: Self-pay | Admitting: Podiatry

## 2018-01-21 DIAGNOSIS — S92344A Nondisplaced fracture of fourth metatarsal bone, right foot, initial encounter for closed fracture: Secondary | ICD-10-CM

## 2018-01-21 DIAGNOSIS — S92501A Displaced unspecified fracture of right lesser toe(s), initial encounter for closed fracture: Secondary | ICD-10-CM | POA: Insufficient documentation

## 2018-01-21 NOTE — Progress Notes (Signed)
Subjective:    Patient ID: KEVRON PATELLA, male    DOB: 1945-10-17, 72 y.o.   MRN: [id number]  HPI 72 year old male presents the office today for concerns of an injury to his right foot.  He states that he may stop his toe last Friday while at the beach.  He was seen by his primary care physician's office and x-rays were obtained which revealed a fracture of the fourth toe he was placed into a splint.  Presents today for further evaluation.  He has noticed some swelling to the right foot he points to the top of the foot with some discomfort.  He has no other concerns.   Review of Systems  All other systems reviewed and are negative.  Past Medical History:  Diagnosis Date  . Anxiety   . Aortic atherosclerosis ([place])   . Arthritis    degenerative of hand,  lumbar,  elbow  . Benign essential tremor   . Bladder outlet obstruction   . BPH without urinary obstruction   . CKD (chronic kidney disease), stage III ([place])   . Coronary artery disease    cardiologist-  dr Marlou Porch--  per cardiac cath 05-31-2017  mild nonobstructive cad w/ normal lvedp  . Depression   . Diastolic murmur   . ED (erectile dysfunction)   . Fatigue   . Foley catheter in place   . GERD (gastroesophageal reflux disease)   . Hemorrhoids   . History of exercise stress test 05-26-2017   dr Marlou Porch   abnormal test , ischemic changes;  cardiac cath done 05-31-2017  . History of kidney stones   . History of melanoma excision    left lower extremity in 2015 approx.-- early malignant   . History of pituitary tumor 1997   s/p  resection benign tumor  . History of pyelonephritis 07/29/2017   due to UTI  . Hydronephrosis, right   . Hypertension   . MVP (mitral valve prolapse)    w/ grade 2 regurg.  per echo 03-09-2017  . Radiculopathy of lumbar region   . Restless legs syndrome (RLS)   . Solitary lung nodule    last CT 06-03-2016 stable, benign  . Vitamin D deficiency   . Wears glasses   . Wears hearing aid in both  ears     Past Surgical History:  Procedure Laterality Date  . CYSTOSCOPY WITH STENT PLACEMENT Right 06/22/2017   Procedure: CYSTOSCOPY WITH RIGHT URETERAL STENT PLACEMENT;  Surgeon: Raynelle Bring, MD;  Location: [facility];  Service: Urology;  Laterality: Right;  . CYSTOSCOPY/URETEROSCOPY/HOLMIUM LASER/STENT PLACEMENT Right 08/18/2017   Procedure: CYSTOSCOPIY/RIGHT RETROGRADE PYLOGRAM/RIGHT URETEROSCOPY;  Surgeon: Ardis Hughs, MD;  Location: [facility];  Service: Urology;  Laterality: Right;  . FINGER SURGERY Left ?   reattach left index finger amputation  . INGUINAL HERNIA REPAIR Left 1998  approx.  Marland Kitchen LEFT HEART CATH AND CORONARY ANGIOGRAPHY N/A 05/31/2017   Procedure: LEFT HEART CATH AND CORONARY ANGIOGRAPHY;  Surgeon: Martinique, Peter M, MD;  Location: [facility];  Service: Cardiovascular;  Laterality: N/A;  abnormal ETT;  minor nonobstructive CAD (dRCA 20%, post astrio 30%), low LVEDP  . TONSILLECTOMY  ~ 1963  . TRANSPHENOIDAL PITUITARY RESECTION  1997  . TRANSTHORACIC ECHOCARDIOGRAM  03/10/2017   mild concentric LVH, ef 47-09%, grade 1 diastolic dysfunction/ mild dilated left artrial cavity/ mild myxomatous degeneration and bileaflet prolapse with moderate (grade 1) MR/  mild TR/  trace AR and PR  . TRANSURETHRAL  RESECTION OF PROSTATE N/A 08/18/2017   Procedure: TRANSURETHRAL RESECTION OF THE PROSTATE (TURP);  Surgeon: Ardis Hughs, MD;  Location[facility];  Service: Urology;  Laterality: N/A;     Current Outpatient Medications:  .  acetaminophen (TYLENOL) 500 MG tablet, Take 1,000 mg by mouth daily as needed for fever., Disp: , Rfl:  .  amLODipine (NORVASC) 5 MG tablet, Take 5 mg by mouth every morning. , Disp: , Rfl:  .  aspirin 81 MG tablet, Take 1 tablet (81 mg total) by mouth daily. Resume once there is no blood in your urine for 48 hours, Disp: 30 tablet, Rfl: prn .  Cholecalciferol 2000 units CAPS, Take 2,000 Units by mouth daily.,  Disp: , Rfl:  .  FLUoxetine (PROZAC) 20 MG capsule, Take 20 mg by mouth daily., Disp: , Rfl:  .  metoprolol succinate (TOPROL XL) 25 MG 24 hr tablet, Take 0.5 tablets (12.5 mg total) by mouth daily. (Patient taking differently: Take 25 mg by mouth every morning. ), Disp: 15 tablet, Rfl: 5 .  Multiple Vitamin (MULTIVITAMIN WITH MINERALS) TABS tablet, Take 1 tablet by mouth daily., Disp: , Rfl:  .  neomycin-polymyxin-pramoxine (NEOSPORIN PLUS) 1 % cream, Apply 1 application topically daily. APPLY TO UPPER LIP AND CATHETER, Disp: , Rfl:  .  ondansetron (ZOFRAN) 4 MG tablet, Take 1 tablet (4 mg total) by mouth every 8 (eight) hours as needed for nausea., Disp: 20 tablet, Rfl: 0 .  pantoprazole (PROTONIX) 40 MG tablet, Take 1 tablet (40 mg total) by mouth 2 (two) times daily. (Patient taking differently: Take 40 mg by mouth 2 (two) times daily. ), Disp: 60 tablet, Rfl: 11 .  rosuvastatin (CRESTOR) 20 MG tablet, Take 20 mg by mouth every morning. , Disp: , Rfl:  .  traMADol (ULTRAM) 50 MG tablet, Take 1-2 tablets (50-100 mg total) by mouth every 6 (six) hours as needed for moderate pain., Disp: 15 tablet, Rfl: 0  Allergies  Allergen Reactions  . Effexor [Venlafaxine]     Anxiety   . Ciprofloxacin Nausea And Vomiting    Tolerated levoquin for 2 weeks as outpatient         Objective:   Physical Exam General: AAO x3, NAD  Dermatological: Mild ecchymosis is present to the digits on the third and fourth toes.  No open lesions.  There are no open sores, no preulcerative lesions, no rash or signs of infection present.  Vascular: Dorsalis Pedis artery and Posterior Tibial artery pedal pulses are 2/4 bilateral with immedate capillary fill time. There is no pain with calf compression, swelling, warmth, erythema.   Neruologic: Grossly intact via light touch bilateral.  Protective threshold with Semmes Wienstein monofilament intact to all pedal sites bilateral.   Musculoskeletal: Right foot there is  tenderness the fourth toe of the toes and rectus position.  There is tenderness on the second third and fourth metatarsals.  There is mild swelling to the right foot compared to contralateral extremity.  There is no erythema warmth.  No pain with ankle joint range of motion.  Achilles tendon, flexor, extensor tendons appear to be intact.  Muscular strength 5/5 in all groups tested bilateral.  Gait: Unassisted, Nonantalgic.      Assessment & Plan:  72 year old male right fourth toe fracture -Treatment options discussed including all alternatives, risks, and complications -Etiology of symptoms were discussed -I independently reviewed the x-rays.  There is a fracture of the fourth toe.  Also concerned about some lucencies  in the third metatarsal diaphysis.  At this point I recommend immobilization in surgical shoe which was dispensed today.  Compression.  Ice and elevation. -RTC 2 weeks  *x-ray next appointment  Trula Slade DPM

## 2018-02-06 ENCOUNTER — Ambulatory Visit: Payer: PPO | Admitting: Podiatry

## 2018-02-06 ENCOUNTER — Ambulatory Visit (INDEPENDENT_AMBULATORY_CARE_PROVIDER_SITE_OTHER): Payer: PPO

## 2018-02-06 DIAGNOSIS — S92501D Displaced unspecified fracture of right lesser toe(s), subsequent encounter for fracture with routine healing: Secondary | ICD-10-CM | POA: Diagnosis not present

## 2018-02-08 NOTE — Progress Notes (Signed)
Subjective: 72 year old male presents the office today for follow-up evaluation of right fourth toe fracture.  He has an occasional sharp, shooting, stabbing pain overall is getting better.  Remain in the surgical shoe.  He is otherwise he is doing well he has no other concerns today. Denies any systemic complaints such as fevers, chills, nausea, vomiting. No acute changes since last appointment, and no other complaints at this time.   Objective: AAO x3, NAD DP/PT pulses palpable bilaterally, CRT less than 3 seconds Tenderness to palpation of the right fourth toe.  There is minimal edema to the fourth toe.  The pain that he is experiencing the metatarsals with improvement only very minimal tenderness to the fourth metatarsal phalangeal joint.  There is no erythema or warmth. No open lesions or pre-ulcerative lesions.  No pain with calf compression, swelling, warmth, erythema  Assessment: Right fourth toe fracture  Plan: -All treatment options discussed with the patient including all alternatives, risks, complications.  -Repeat x-rays were obtained reviewed.  Healing fracture of the fourth proximal bones.  No other evidence of acute fracture or stress fracture. -Remain in the surgical shoe for now.  Gradually transition to a regular shoe over the next couple weeks if he is able to.  Continue ice and elevate. -Patient encouraged to call the office with any questions, concerns, change in symptoms.   Follow-up in 4 weeks or sooner if any issues are to arise.  Trula Slade DPM

## 2018-02-27 ENCOUNTER — Ambulatory Visit: Payer: PPO | Admitting: Podiatry

## 2018-03-09 DIAGNOSIS — N401 Enlarged prostate with lower urinary tract symptoms: Secondary | ICD-10-CM | POA: Diagnosis not present

## 2018-03-09 DIAGNOSIS — N5201 Erectile dysfunction due to arterial insufficiency: Secondary | ICD-10-CM | POA: Diagnosis not present

## 2018-03-09 DIAGNOSIS — N130 Hydronephrosis with ureteropelvic junction obstruction: Secondary | ICD-10-CM | POA: Diagnosis not present

## 2018-03-09 DIAGNOSIS — R351 Nocturia: Secondary | ICD-10-CM | POA: Diagnosis not present

## 2018-04-19 DIAGNOSIS — N183 Chronic kidney disease, stage 3 (moderate): Secondary | ICD-10-CM | POA: Diagnosis not present

## 2018-04-19 DIAGNOSIS — N401 Enlarged prostate with lower urinary tract symptoms: Secondary | ICD-10-CM | POA: Diagnosis not present

## 2018-04-19 DIAGNOSIS — N400 Enlarged prostate without lower urinary tract symptoms: Secondary | ICD-10-CM | POA: Diagnosis not present

## 2018-04-19 DIAGNOSIS — F329 Major depressive disorder, single episode, unspecified: Secondary | ICD-10-CM | POA: Diagnosis not present

## 2018-04-19 DIAGNOSIS — F324 Major depressive disorder, single episode, in partial remission: Secondary | ICD-10-CM | POA: Diagnosis not present

## 2018-04-19 DIAGNOSIS — I10 Essential (primary) hypertension: Secondary | ICD-10-CM | POA: Diagnosis not present

## 2018-04-19 DIAGNOSIS — N1330 Unspecified hydronephrosis: Secondary | ICD-10-CM | POA: Diagnosis not present

## 2018-04-19 DIAGNOSIS — E782 Mixed hyperlipidemia: Secondary | ICD-10-CM | POA: Diagnosis not present

## 2018-04-19 DIAGNOSIS — D649 Anemia, unspecified: Secondary | ICD-10-CM | POA: Diagnosis not present

## 2018-04-27 ENCOUNTER — Encounter: Payer: Self-pay | Admitting: Cardiology

## 2018-05-16 ENCOUNTER — Ambulatory Visit (INDEPENDENT_AMBULATORY_CARE_PROVIDER_SITE_OTHER): Payer: PPO | Admitting: Cardiology

## 2018-05-16 ENCOUNTER — Encounter: Payer: Self-pay | Admitting: Cardiology

## 2018-05-16 VITALS — BP 112/70 | HR 49 | Ht 70.0 in | Wt 196.6 lb

## 2018-05-16 DIAGNOSIS — I10 Essential (primary) hypertension: Secondary | ICD-10-CM

## 2018-05-16 DIAGNOSIS — I700 Atherosclerosis of aorta: Secondary | ICD-10-CM | POA: Diagnosis not present

## 2018-05-16 DIAGNOSIS — I2510 Atherosclerotic heart disease of native coronary artery without angina pectoris: Secondary | ICD-10-CM | POA: Diagnosis not present

## 2018-05-16 DIAGNOSIS — I341 Nonrheumatic mitral (valve) prolapse: Secondary | ICD-10-CM

## 2018-05-16 NOTE — Patient Instructions (Signed)
Medication Instructions:  Please discontinue your Metoprolol. Continue all other medications as listed.  If you need a refill on your cardiac medications before your next appointment, please call your pharmacy.   Testing/Procedures: Please have echo as scheduled.  Follow-Up: At [facility], you and your health needs are our priority.  As part of our continuing mission to provide you with exceptional heart care, we have created designated Provider Care Teams.  These Care Teams include your primary Cardiologist (physician) and Advanced Practice Providers (APPs -  Physician Assistants and Nurse Practitioners) who all work together to provide you with the care you need, when you need it. You will need a follow up appointment in 12 months.  Please call our office 2 months in advance to schedule this appointment.  You may see Candee Furbish, MD or one of the following Advanced Practice Providers on your designated Care Team:   Truitt Merle, NP Cecilie Kicks, NP . Kathyrn Drown, NP  Thank you for choosing [facility]!!

## 2018-05-16 NOTE — Progress Notes (Signed)
Cardiology Office Note:    Date:  05/16/2018   ID:  Lucas Hardy, DOB November 12, 1945, MRN [id number]  PCP:  Josetta Huddle, MD  Cardiologist:  Candee Furbish, MD  Electrophysiologist:  None   Referring MD: Josetta Huddle, MD     History of Present Illness:    Lucas Hardy is a 73 y.o. male here for follow-up of mitral regurgitation and coronary artery calcifications.  Moderate mitral regurgitation on echocardiogram in 2018 in [place].  No shortness of breath no chest pain fevers chills nausea vomiting syncope.  Last year had obstruction, prostatic obstruction, bladder drained.  Creatinine was quite high at 1 point now back down to 1.2.  Cardiac catheterization was performed that showed mild nonobstructive disease in 2019.    Past Medical History:  Diagnosis Date  . Anxiety   . Aortic atherosclerosis ([place])   . Arthritis    degenerative of hand,  lumbar,  elbow  . Benign essential tremor   . Bladder outlet obstruction   . BPH without urinary obstruction   . CKD (chronic kidney disease), stage III ([place])   . Coronary artery disease    cardiologist-  dr Marlou Porch--  per cardiac cath 05-31-2017  mild nonobstructive cad w/ normal lvedp  . Depression   . Diastolic murmur   . ED (erectile dysfunction)   . Fatigue   . Foley catheter in place   . GERD (gastroesophageal reflux disease)   . Hemorrhoids   . History of exercise stress test 05-26-2017   dr Marlou Porch   abnormal test , ischemic changes;  cardiac cath done 05-31-2017  . History of kidney stones   . History of melanoma excision    left lower extremity in 2015 approx.-- early malignant   . History of pituitary tumor 1997   s/p  resection benign tumor  . History of pyelonephritis 07/29/2017   due to UTI  . Hydronephrosis, right   . Hypertension   . MVP (mitral valve prolapse)    w/ grade 2 regurg.  per echo 03-09-2017  . Radiculopathy of lumbar region   . Restless legs syndrome (RLS)   . Solitary lung nodule    last CT  06-03-2016 stable, benign  . Vitamin D deficiency   . Wears glasses   . Wears hearing aid in both ears     Past Surgical History:  Procedure Laterality Date  . CYSTOSCOPY WITH STENT PLACEMENT Right 06/22/2017   Procedure: CYSTOSCOPY WITH RIGHT URETERAL STENT PLACEMENT;  Surgeon: Raynelle Bring, MD;  Location: [facility];  Service: Urology;  Laterality: Right;  . CYSTOSCOPY/URETEROSCOPY/HOLMIUM LASER/STENT PLACEMENT Right 08/18/2017   Procedure: CYSTOSCOPIY/RIGHT RETROGRADE PYLOGRAM/RIGHT URETEROSCOPY;  Surgeon: Ardis Hughs, MD;  Location: [facility];  Service: Urology;  Laterality: Right;  . FINGER SURGERY Left ?   reattach left index finger amputation  . INGUINAL HERNIA REPAIR Left 1998  approx.  Marland Kitchen LEFT HEART CATH AND CORONARY ANGIOGRAPHY N/A 05/31/2017   Procedure: LEFT HEART CATH AND CORONARY ANGIOGRAPHY;  Surgeon: Martinique, Peter M, MD;  Location: [place] [facility];  Service: Cardiovascular;  Laterality: N/A;  abnormal ETT;  minor nonobstructive CAD (dRCA 20%, post astrio 30%), low LVEDP  . TONSILLECTOMY  ~ 1963  . TRANSPHENOIDAL PITUITARY RESECTION  1997  . TRANSTHORACIC ECHOCARDIOGRAM  03/10/2017   mild concentric LVH, ef 23-55%, grade 1 diastolic dysfunction/ mild dilated left artrial cavity/ mild myxomatous degeneration and bileaflet prolapse with moderate (grade 1) MR/  mild TR/  trace AR and PR  .  TRANSURETHRAL RESECTION OF PROSTATE N/A 08/18/2017   Procedure: TRANSURETHRAL RESECTION OF THE PROSTATE (TURP);  Surgeon: Ardis Hughs, MD;  Location: [facility];  Service: Urology;  Laterality: N/A;    Current Medications: Current Meds  Medication Sig  . acetaminophen (TYLENOL) 500 MG tablet Take 1,000 mg by mouth daily as needed for fever.  Marland Kitchen amLODipine (NORVASC) 5 MG tablet Take 5 mg by mouth every morning.   Marland Kitchen aspirin 81 MG tablet Take 1 tablet (81 mg total) by mouth daily. Resume once there is no blood in your urine for 48 hours  .  Cholecalciferol 2000 units CAPS Take 2,000 Units by mouth daily.  Marland Kitchen FLUoxetine (PROZAC) 40 MG capsule Take 40 mg by mouth every morning.  . Multiple Vitamin (MULTIVITAMIN WITH MINERALS) TABS tablet Take 1 tablet by mouth daily.  . rosuvastatin (CRESTOR) 20 MG tablet Take 20 mg by mouth every morning.   . [DISCONTINUED] metoprolol succinate (TOPROL-XL) 25 MG 24 hr tablet Take 25 mg by mouth daily.     Allergies:   Effexor [venlafaxine] and Ciprofloxacin   Social History   Socioeconomic History  . Marital status: Married    Spouse name: Not on file  . Number of children: Not on file  . Years of education: Not on file  . Highest education level: Not on file  Occupational History  . Not on file  Social Needs  . Financial resource strain: Not on file  . Food insecurity:    Worry: Not on file    Inability: Not on file  . Transportation needs:    Medical: Not on file    Non-medical: Not on file  Tobacco Use  . Smoking status: Former Smoker    Packs/day: 1.00    Years: 15.00    Pack years: 15.00    Types: Cigarettes    Last attempt to quit: 04/11/1978    Years since quitting: 40.1  . Smokeless tobacco: Former Systems developer    Types: [place] date: 08/13/1998  Substance and Sexual Activity  . Alcohol use: Yes    Frequency: Never    Comment: rare  . Drug use: No  . Sexual activity: Not Currently  Lifestyle  . Physical activity:    Days per week: Not on file    Minutes per session: Not on file  . Stress: Not on file  Relationships  . Social connections:    Talks on phone: Not on file    Gets together: Not on file    Attends religious service: Not on file    Active member of club or organization: Not on file    Attends meetings of clubs or organizations: Not on file    Relationship status: Not on file  Other Topics Concern  . Not on file  Social History Narrative  . Not on file     Family History: The patient's family history includes Hypertension in his father.  ROS:     Please see the history of present illness.    All other systems reviewed and are negative.  EKGs/Labs/Other Studies Reviewed:    The following studies were reviewed today: EKG prior echo catheterization reviewed  EKG:  EKG is ordered today.  The ekg ordered today demonstrates sinus bradycardia rate 49 with nonspecific ST-T wave changes.  Personally reviewed and interpreted.  Recent Labs: 08/17/2017: ALT 17 08/19/2017: BUN 20; Creatinine, Ser 1.38; Hemoglobin 10.1; Platelets 274; Potassium 4.0; Sodium 136  Recent Lipid Panel No  results found for: CHOL, TRIG, HDL, CHOLHDL, VLDL, LDLCALC, LDLDIRECT  Physical Exam:    VS:  BP 112/70   Pulse (!) 49   Ht 5\' 10"  (1.778 m)   Wt 196 lb 9.6 oz (89.2 kg)   SpO2 95%   BMI 28.21 kg/m     Wt Readings from Last 3 Encounters:  05/16/18 196 lb 9.6 oz (89.2 kg)  08/18/17 180 lb 11.2 oz (82 kg)  07/29/17 170 lb (77.1 kg)     GEN:  Well nourished, well developed in no acute distress HEENT: Normal NECK: No JVD; No carotid bruits LYMPHATICS: No lymphadenopathy CARDIAC: brady RR, no murmurs, rubs, gallops RESPIRATORY:  Clear to auscultation without rales, wheezing or rhonchi  ABDOMEN: Soft, non-tender, non-distended MUSCULOSKELETAL:  No edema; No deformity  SKIN: Warm and dry NEUROLOGIC:  Alert and oriented x 3 PSYCHIATRIC:  Normal affect   ASSESSMENT:    1. Essential hypertension   2. Coronary artery disease involving native coronary artery of native heart without angina pectoris    PLAN:    In order of problems listed above:  Moderate mitral regurgitation - We will be checking an echocardiogram next week.  Sinus bradycardia -Asymptomatic however we will stop his Toprol-XL 25 mg.  Nonobstructive coronary artery disease - Mild disease.  Recommend that he continue with statin therapy for secondary prevention.  Aspirin.  Prior bladder outlet obstruction.  Resolved.  Creatinine is now improved.  Aortic atherosclerosis -Continue  with statin therapy.  Medication Adjustments/Labs and Tests Ordered: Current medicines are reviewed at length with the patient today.  Concerns regarding medicines are outlined above.  Orders Placed This Encounter  Procedures  . EKG 12-Lead   No orders of the defined types were placed in this encounter.   Patient Instructions  Medication Instructions:  Please discontinue your Metoprolol. Continue all other medications as listed.  If you need a refill on your cardiac medications before your next appointment, please call your pharmacy.   Testing/Procedures: Please have echo as scheduled.  Follow-Up: At [facility], you and your health needs are our priority.  As part of our continuing mission to provide you with exceptional heart care, we have created designated Provider Care Teams.  These Care Teams include your primary Cardiologist (physician) and Advanced Practice Providers (APPs -  Physician Assistants and Nurse Practitioners) who all work together to provide you with the care you need, when you need it. You will need a follow up appointment in 12 months.  Please call our office 2 months in advance to schedule this appointment.  You may see Candee Furbish, MD or one of the following Advanced Practice Providers on your designated Care Team:   Truitt Merle, NP Cecilie Kicks, NP . Kathyrn Drown, NP  Thank you for choosing [facility]!!         Signed, Candee Furbish, MD  05/16/2018 3:10 PM    [place]

## 2018-05-26 ENCOUNTER — Other Ambulatory Visit (HOSPITAL_COMMUNITY): Payer: PPO

## 2018-05-29 ENCOUNTER — Ambulatory Visit (HOSPITAL_COMMUNITY): Payer: PPO | Attending: Cardiology

## 2018-05-29 DIAGNOSIS — I341 Nonrheumatic mitral (valve) prolapse: Secondary | ICD-10-CM | POA: Diagnosis not present

## 2018-05-29 DIAGNOSIS — I340 Nonrheumatic mitral (valve) insufficiency: Secondary | ICD-10-CM | POA: Insufficient documentation

## 2018-05-29 DIAGNOSIS — I2510 Atherosclerotic heart disease of native coronary artery without angina pectoris: Secondary | ICD-10-CM | POA: Diagnosis not present

## 2018-05-29 DIAGNOSIS — R0602 Shortness of breath: Secondary | ICD-10-CM

## 2018-06-02 ENCOUNTER — Other Ambulatory Visit: Payer: Self-pay | Admitting: Cardiology

## 2018-06-06 DIAGNOSIS — D1801 Hemangioma of skin and subcutaneous tissue: Secondary | ICD-10-CM | POA: Diagnosis not present

## 2018-06-06 DIAGNOSIS — Z85820 Personal history of malignant melanoma of skin: Secondary | ICD-10-CM | POA: Diagnosis not present

## 2018-06-06 DIAGNOSIS — L821 Other seborrheic keratosis: Secondary | ICD-10-CM | POA: Diagnosis not present

## 2018-06-06 DIAGNOSIS — D225 Melanocytic nevi of trunk: Secondary | ICD-10-CM | POA: Diagnosis not present

## 2018-07-23 ENCOUNTER — Other Ambulatory Visit: Payer: Self-pay | Admitting: Cardiology

## 2018-07-26 DIAGNOSIS — I10 Essential (primary) hypertension: Secondary | ICD-10-CM | POA: Diagnosis not present

## 2018-07-26 DIAGNOSIS — R001 Bradycardia, unspecified: Secondary | ICD-10-CM | POA: Diagnosis not present

## 2018-08-23 DIAGNOSIS — R001 Bradycardia, unspecified: Secondary | ICD-10-CM | POA: Diagnosis not present

## 2018-08-23 DIAGNOSIS — I10 Essential (primary) hypertension: Secondary | ICD-10-CM | POA: Diagnosis not present

## 2018-08-27 IMAGING — DX DG CHEST 2V
2 series · 2 of 2 positions shown · non-contrast
Comparison: 05/10/2013

CLINICAL DATA: Fever today and treated for kidney infection.

EXAM:
CHEST - 2 VIEW

[chest pa]
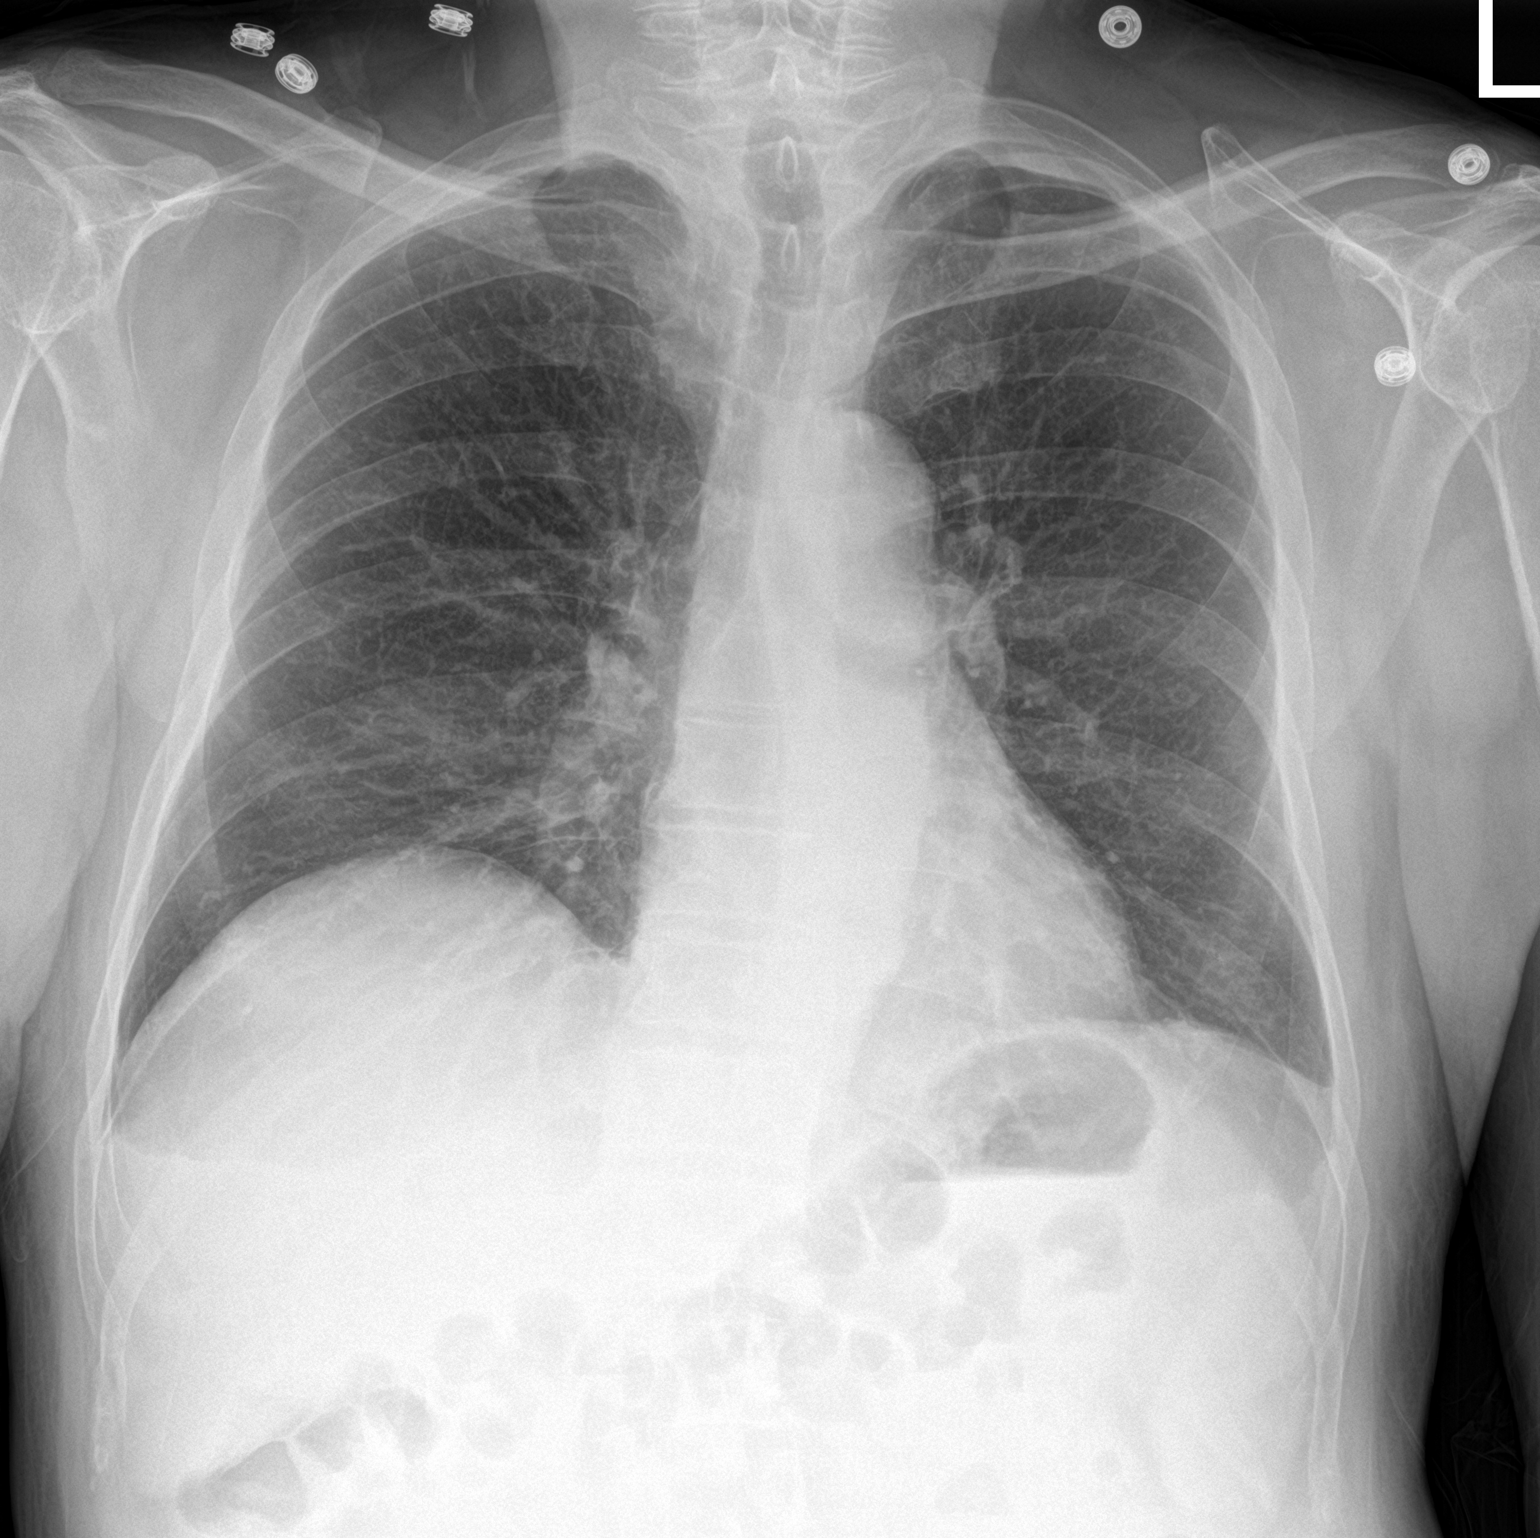

[chest lat]
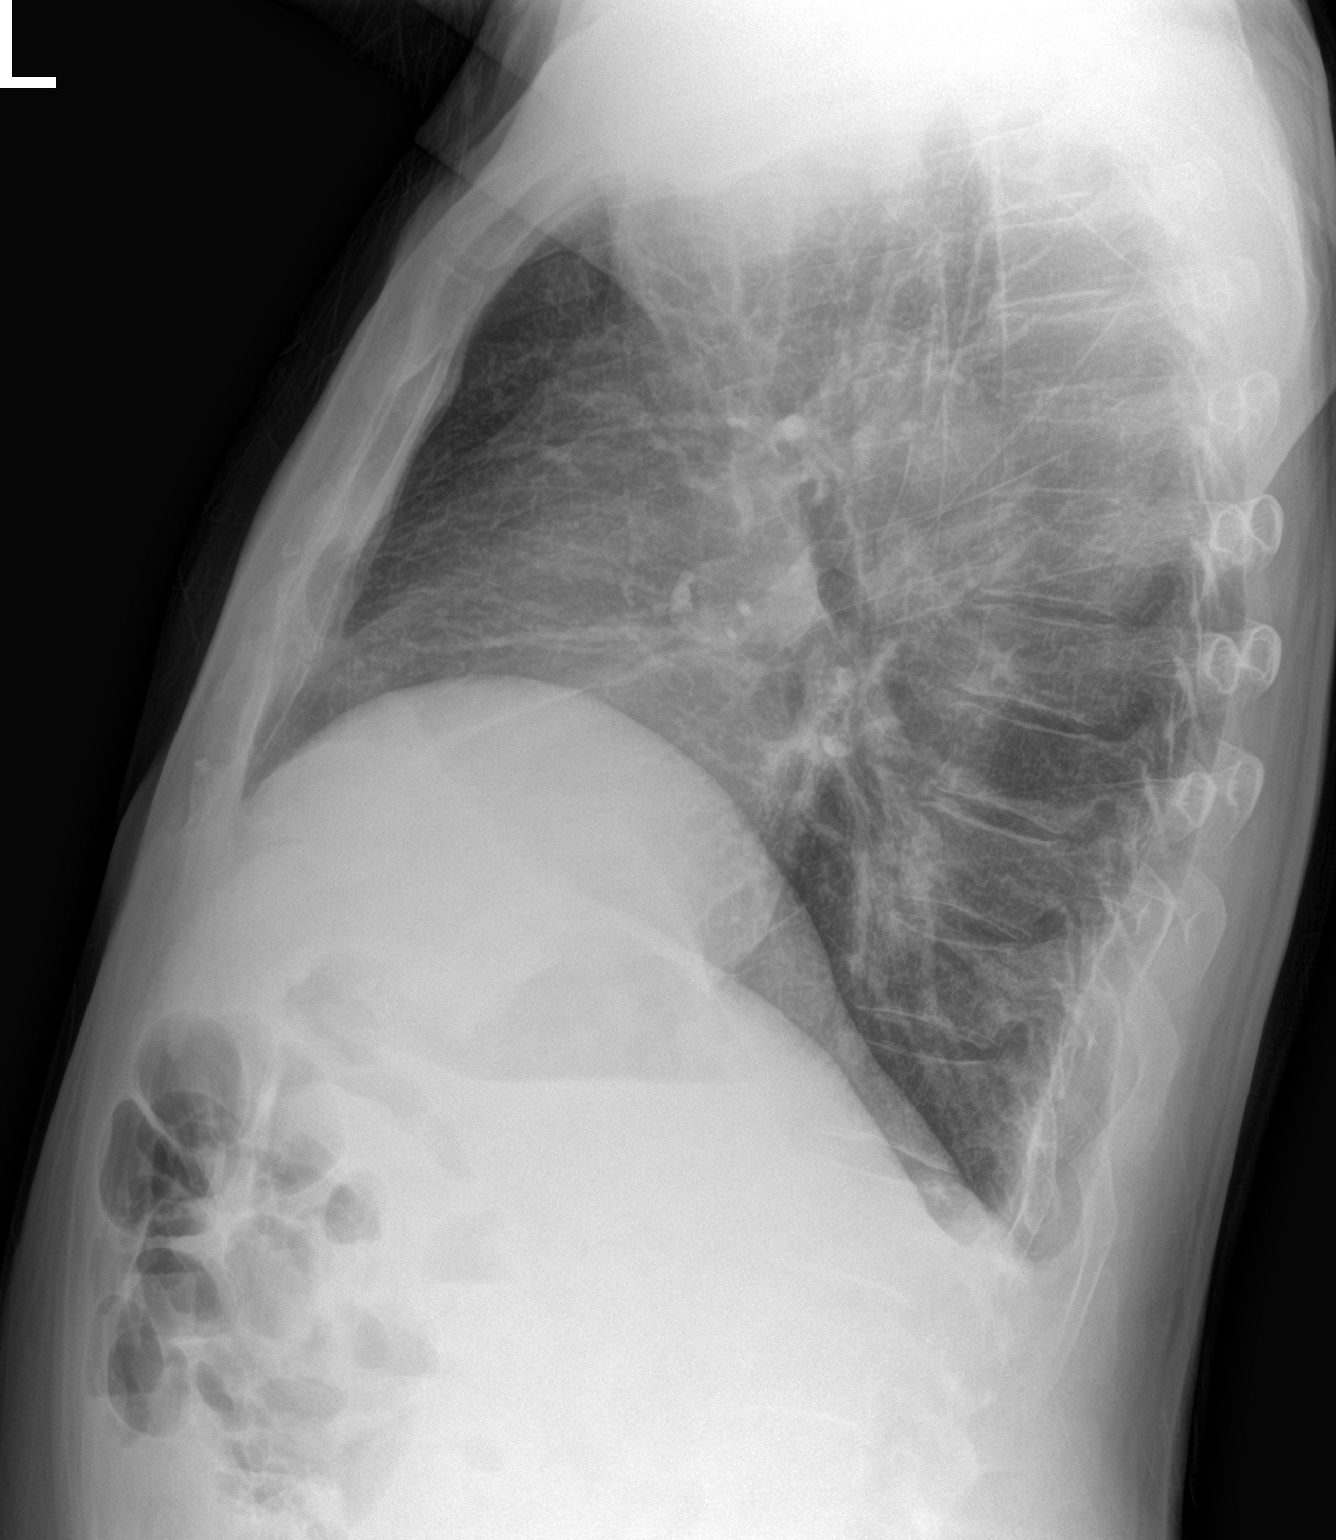

[2 of 2 positions shown; findings below may reference images not displayed]

FINDINGS: Lungs are adequately inflated and otherwise clear. Cardiomediastinal
silhouette and remainder of the exam is unchanged.
IMPRESSION: No active cardiopulmonary disease.

## 2018-12-19 DIAGNOSIS — N400 Enlarged prostate without lower urinary tract symptoms: Secondary | ICD-10-CM | POA: Diagnosis not present

## 2018-12-19 DIAGNOSIS — Z1389 Encounter for screening for other disorder: Secondary | ICD-10-CM | POA: Diagnosis not present

## 2018-12-19 DIAGNOSIS — N401 Enlarged prostate with lower urinary tract symptoms: Secondary | ICD-10-CM | POA: Diagnosis not present

## 2018-12-19 DIAGNOSIS — Z23 Encounter for immunization: Secondary | ICD-10-CM | POA: Diagnosis not present

## 2018-12-19 DIAGNOSIS — Z0001 Encounter for general adult medical examination with abnormal findings: Secondary | ICD-10-CM | POA: Diagnosis not present

## 2018-12-19 DIAGNOSIS — F329 Major depressive disorder, single episode, unspecified: Secondary | ICD-10-CM | POA: Diagnosis not present

## 2018-12-19 DIAGNOSIS — R413 Other amnesia: Secondary | ICD-10-CM | POA: Diagnosis not present

## 2018-12-19 DIAGNOSIS — E782 Mixed hyperlipidemia: Secondary | ICD-10-CM | POA: Diagnosis not present

## 2018-12-19 DIAGNOSIS — E559 Vitamin D deficiency, unspecified: Secondary | ICD-10-CM | POA: Diagnosis not present

## 2018-12-19 DIAGNOSIS — I10 Essential (primary) hypertension: Secondary | ICD-10-CM | POA: Diagnosis not present

## 2018-12-19 DIAGNOSIS — Z79899 Other long term (current) drug therapy: Secondary | ICD-10-CM | POA: Diagnosis not present

## 2018-12-19 DIAGNOSIS — N1330 Unspecified hydronephrosis: Secondary | ICD-10-CM | POA: Diagnosis not present

## 2018-12-19 DIAGNOSIS — N183 Chronic kidney disease, stage 3 (moderate): Secondary | ICD-10-CM | POA: Diagnosis not present

## 2018-12-19 DIAGNOSIS — I2510 Atherosclerotic heart disease of native coronary artery without angina pectoris: Secondary | ICD-10-CM | POA: Diagnosis not present

## 2018-12-19 DIAGNOSIS — D649 Anemia, unspecified: Secondary | ICD-10-CM | POA: Diagnosis not present

## 2018-12-19 DIAGNOSIS — F322 Major depressive disorder, single episode, severe without psychotic features: Secondary | ICD-10-CM | POA: Diagnosis not present

## 2018-12-19 DIAGNOSIS — F324 Major depressive disorder, single episode, in partial remission: Secondary | ICD-10-CM | POA: Diagnosis not present

## 2018-12-19 DIAGNOSIS — H9193 Unspecified hearing loss, bilateral: Secondary | ICD-10-CM | POA: Diagnosis not present

## 2019-01-18 DIAGNOSIS — Z85820 Personal history of malignant melanoma of skin: Secondary | ICD-10-CM | POA: Diagnosis not present

## 2019-01-18 DIAGNOSIS — L814 Other melanin hyperpigmentation: Secondary | ICD-10-CM | POA: Diagnosis not present

## 2019-01-18 DIAGNOSIS — L218 Other seborrheic dermatitis: Secondary | ICD-10-CM | POA: Diagnosis not present

## 2019-01-18 DIAGNOSIS — D225 Melanocytic nevi of trunk: Secondary | ICD-10-CM | POA: Diagnosis not present

## 2019-01-18 DIAGNOSIS — L989 Disorder of the skin and subcutaneous tissue, unspecified: Secondary | ICD-10-CM | POA: Diagnosis not present

## 2019-01-18 DIAGNOSIS — D1801 Hemangioma of skin and subcutaneous tissue: Secondary | ICD-10-CM | POA: Diagnosis not present

## 2019-01-18 DIAGNOSIS — D485 Neoplasm of uncertain behavior of skin: Secondary | ICD-10-CM | POA: Diagnosis not present

## 2019-02-02 DIAGNOSIS — F322 Major depressive disorder, single episode, severe without psychotic features: Secondary | ICD-10-CM | POA: Diagnosis not present

## 2019-02-02 DIAGNOSIS — N401 Enlarged prostate with lower urinary tract symptoms: Secondary | ICD-10-CM | POA: Diagnosis not present

## 2019-02-02 DIAGNOSIS — I10 Essential (primary) hypertension: Secondary | ICD-10-CM | POA: Diagnosis not present

## 2019-02-02 DIAGNOSIS — D649 Anemia, unspecified: Secondary | ICD-10-CM | POA: Diagnosis not present

## 2019-02-02 DIAGNOSIS — F329 Major depressive disorder, single episode, unspecified: Secondary | ICD-10-CM | POA: Diagnosis not present

## 2019-02-02 DIAGNOSIS — N400 Enlarged prostate without lower urinary tract symptoms: Secondary | ICD-10-CM | POA: Diagnosis not present

## 2019-02-02 DIAGNOSIS — E782 Mixed hyperlipidemia: Secondary | ICD-10-CM | POA: Diagnosis not present

## 2019-02-02 DIAGNOSIS — F324 Major depressive disorder, single episode, in partial remission: Secondary | ICD-10-CM | POA: Diagnosis not present

## 2019-02-02 DIAGNOSIS — I2510 Atherosclerotic heart disease of native coronary artery without angina pectoris: Secondary | ICD-10-CM | POA: Diagnosis not present

## 2019-02-02 DIAGNOSIS — N1330 Unspecified hydronephrosis: Secondary | ICD-10-CM | POA: Diagnosis not present

## 2019-02-19 DIAGNOSIS — D485 Neoplasm of uncertain behavior of skin: Secondary | ICD-10-CM | POA: Diagnosis not present

## 2019-02-19 DIAGNOSIS — L905 Scar conditions and fibrosis of skin: Secondary | ICD-10-CM | POA: Diagnosis not present

## 2019-02-21 DIAGNOSIS — I10 Essential (primary) hypertension: Secondary | ICD-10-CM | POA: Diagnosis not present

## 2019-03-05 DIAGNOSIS — L308 Other specified dermatitis: Secondary | ICD-10-CM | POA: Diagnosis not present

## 2019-03-05 DIAGNOSIS — Z872 Personal history of diseases of the skin and subcutaneous tissue: Secondary | ICD-10-CM | POA: Diagnosis not present

## 2019-03-06 DIAGNOSIS — M7731 Calcaneal spur, right foot: Secondary | ICD-10-CM | POA: Diagnosis not present

## 2019-03-06 DIAGNOSIS — M722 Plantar fascial fibromatosis: Secondary | ICD-10-CM | POA: Diagnosis not present

## 2019-03-06 DIAGNOSIS — M71571 Other bursitis, not elsewhere classified, right ankle and foot: Secondary | ICD-10-CM | POA: Diagnosis not present

## 2019-03-15 DIAGNOSIS — Z20828 Contact with and (suspected) exposure to other viral communicable diseases: Secondary | ICD-10-CM | POA: Diagnosis not present

## 2019-04-03 DIAGNOSIS — N401 Enlarged prostate with lower urinary tract symptoms: Secondary | ICD-10-CM | POA: Diagnosis not present

## 2019-04-03 DIAGNOSIS — F329 Major depressive disorder, single episode, unspecified: Secondary | ICD-10-CM | POA: Diagnosis not present

## 2019-04-03 DIAGNOSIS — F324 Major depressive disorder, single episode, in partial remission: Secondary | ICD-10-CM | POA: Diagnosis not present

## 2019-04-03 DIAGNOSIS — F322 Major depressive disorder, single episode, severe without psychotic features: Secondary | ICD-10-CM | POA: Diagnosis not present

## 2019-04-03 DIAGNOSIS — N400 Enlarged prostate without lower urinary tract symptoms: Secondary | ICD-10-CM | POA: Diagnosis not present

## 2019-04-03 DIAGNOSIS — N1330 Unspecified hydronephrosis: Secondary | ICD-10-CM | POA: Diagnosis not present

## 2019-04-03 DIAGNOSIS — D649 Anemia, unspecified: Secondary | ICD-10-CM | POA: Diagnosis not present

## 2019-04-03 DIAGNOSIS — E7800 Pure hypercholesterolemia, unspecified: Secondary | ICD-10-CM | POA: Diagnosis not present

## 2019-04-03 DIAGNOSIS — I10 Essential (primary) hypertension: Secondary | ICD-10-CM | POA: Diagnosis not present

## 2019-04-03 DIAGNOSIS — E782 Mixed hyperlipidemia: Secondary | ICD-10-CM | POA: Diagnosis not present

## 2019-04-03 DIAGNOSIS — I2510 Atherosclerotic heart disease of native coronary artery without angina pectoris: Secondary | ICD-10-CM | POA: Diagnosis not present

## 2019-04-17 DIAGNOSIS — M722 Plantar fascial fibromatosis: Secondary | ICD-10-CM | POA: Diagnosis not present

## 2019-04-17 DIAGNOSIS — M71571 Other bursitis, not elsewhere classified, right ankle and foot: Secondary | ICD-10-CM | POA: Diagnosis not present

## 2019-04-24 DIAGNOSIS — E782 Mixed hyperlipidemia: Secondary | ICD-10-CM | POA: Diagnosis not present

## 2019-04-24 DIAGNOSIS — R69 Illness, unspecified: Secondary | ICD-10-CM | POA: Diagnosis not present

## 2019-04-24 DIAGNOSIS — N1330 Unspecified hydronephrosis: Secondary | ICD-10-CM | POA: Diagnosis not present

## 2019-04-24 DIAGNOSIS — N400 Enlarged prostate without lower urinary tract symptoms: Secondary | ICD-10-CM | POA: Diagnosis not present

## 2019-04-24 DIAGNOSIS — I10 Essential (primary) hypertension: Secondary | ICD-10-CM | POA: Diagnosis not present

## 2019-04-24 DIAGNOSIS — D649 Anemia, unspecified: Secondary | ICD-10-CM | POA: Diagnosis not present

## 2019-04-24 DIAGNOSIS — I2510 Atherosclerotic heart disease of native coronary artery without angina pectoris: Secondary | ICD-10-CM | POA: Diagnosis not present

## 2019-04-24 DIAGNOSIS — N401 Enlarged prostate with lower urinary tract symptoms: Secondary | ICD-10-CM | POA: Diagnosis not present

## 2019-04-30 DIAGNOSIS — R3912 Poor urinary stream: Secondary | ICD-10-CM | POA: Diagnosis not present

## 2019-04-30 DIAGNOSIS — N401 Enlarged prostate with lower urinary tract symptoms: Secondary | ICD-10-CM | POA: Diagnosis not present

## 2019-05-14 ENCOUNTER — Ambulatory Visit: Payer: PRIVATE HEALTH INSURANCE | Attending: Internal Medicine

## 2019-05-14 DIAGNOSIS — Z23 Encounter for immunization: Secondary | ICD-10-CM

## 2019-05-14 NOTE — Progress Notes (Signed)
   Covid-19 Vaccination Clinic  Name:  Lucas Hardy    MRN: [id number] DOB: [birth year]  05/14/2019  [name] was observed post Covid-19 immunization for 15 minutes without incidence. He was provided with Vaccine Information Sheet and instruction to access the V-Safe system.   [name] was instructed to call 911 with any severe reactions post vaccine: Marland Kitchen Difficulty breathing  . Swelling of your face and throat  . A fast heartbeat  . A bad rash all over your body  . Dizziness and weakness    Immunizations Administered    Name Date Dose VIS Date Route   Pfizer COVID-19 Vaccine 05/14/2019  2:49 PM 0.3 mL 03/16/2019 Intramuscular   Manufacturer: [place]   Lot: [id number]   [place]: [id number]

## 2019-05-21 DIAGNOSIS — I2510 Atherosclerotic heart disease of native coronary artery without angina pectoris: Secondary | ICD-10-CM | POA: Diagnosis not present

## 2019-05-21 DIAGNOSIS — R69 Illness, unspecified: Secondary | ICD-10-CM | POA: Diagnosis not present

## 2019-05-21 DIAGNOSIS — N1330 Unspecified hydronephrosis: Secondary | ICD-10-CM | POA: Diagnosis not present

## 2019-05-21 DIAGNOSIS — D649 Anemia, unspecified: Secondary | ICD-10-CM | POA: Diagnosis not present

## 2019-05-21 DIAGNOSIS — E782 Mixed hyperlipidemia: Secondary | ICD-10-CM | POA: Diagnosis not present

## 2019-05-21 DIAGNOSIS — N400 Enlarged prostate without lower urinary tract symptoms: Secondary | ICD-10-CM | POA: Diagnosis not present

## 2019-05-24 DIAGNOSIS — H5213 Myopia, bilateral: Secondary | ICD-10-CM | POA: Diagnosis not present

## 2019-06-08 ENCOUNTER — Ambulatory Visit: Payer: PRIVATE HEALTH INSURANCE | Attending: Internal Medicine

## 2019-06-08 ENCOUNTER — Other Ambulatory Visit: Payer: Self-pay

## 2019-06-08 DIAGNOSIS — Z23 Encounter for immunization: Secondary | ICD-10-CM

## 2019-06-08 NOTE — Progress Notes (Signed)
   Covid-19 Vaccination Clinic  Name:  Lucas Hardy    MRN: [id number] DOB: 01-Sep-1945  06/08/2019  [name] was observed post Covid-19 immunization for 15 minutes without incident. He was provided with Vaccine Information Sheet and instruction to access the V-Safe system.   [name] was instructed to call 911 with any severe reactions post vaccine: Marland Kitchen Difficulty breathing  . Swelling of face and throat  . A fast heartbeat  . A bad rash all over body  . Dizziness and weakness   Immunizations Administered    Name Date Dose VIS Date Route   Pfizer COVID-19 Vaccine 06/08/2019 10:20 AM 0.3 mL 03/16/2019 Intramuscular   Manufacturer: [place]   Lot: [id number]   [place]: [id number]

## 2019-06-13 DIAGNOSIS — M722 Plantar fascial fibromatosis: Secondary | ICD-10-CM | POA: Diagnosis not present

## 2019-06-13 DIAGNOSIS — N529 Male erectile dysfunction, unspecified: Secondary | ICD-10-CM | POA: Diagnosis not present

## 2019-06-13 DIAGNOSIS — Z791 Long term (current) use of non-steroidal anti-inflammatories (NSAID): Secondary | ICD-10-CM | POA: Diagnosis not present

## 2019-06-13 DIAGNOSIS — M1990 Unspecified osteoarthritis, unspecified site: Secondary | ICD-10-CM | POA: Diagnosis not present

## 2019-06-13 DIAGNOSIS — R69 Illness, unspecified: Secondary | ICD-10-CM | POA: Diagnosis not present

## 2019-06-13 DIAGNOSIS — Z6827 Body mass index (BMI) 27.0-27.9, adult: Secondary | ICD-10-CM | POA: Diagnosis not present

## 2019-06-13 DIAGNOSIS — Z7982 Long term (current) use of aspirin: Secondary | ICD-10-CM | POA: Diagnosis not present

## 2019-06-13 DIAGNOSIS — E785 Hyperlipidemia, unspecified: Secondary | ICD-10-CM | POA: Diagnosis not present

## 2019-06-13 DIAGNOSIS — I10 Essential (primary) hypertension: Secondary | ICD-10-CM | POA: Diagnosis not present

## 2019-06-13 DIAGNOSIS — E663 Overweight: Secondary | ICD-10-CM | POA: Diagnosis not present

## 2019-06-19 DIAGNOSIS — E782 Mixed hyperlipidemia: Secondary | ICD-10-CM | POA: Diagnosis not present

## 2019-06-19 DIAGNOSIS — N400 Enlarged prostate without lower urinary tract symptoms: Secondary | ICD-10-CM | POA: Diagnosis not present

## 2019-06-19 DIAGNOSIS — N1330 Unspecified hydronephrosis: Secondary | ICD-10-CM | POA: Diagnosis not present

## 2019-06-19 DIAGNOSIS — D649 Anemia, unspecified: Secondary | ICD-10-CM | POA: Diagnosis not present

## 2019-06-19 DIAGNOSIS — I10 Essential (primary) hypertension: Secondary | ICD-10-CM | POA: Diagnosis not present

## 2019-06-19 DIAGNOSIS — I2510 Atherosclerotic heart disease of native coronary artery without angina pectoris: Secondary | ICD-10-CM | POA: Diagnosis not present

## 2019-06-19 DIAGNOSIS — R69 Illness, unspecified: Secondary | ICD-10-CM | POA: Diagnosis not present

## 2019-06-19 DIAGNOSIS — N183 Chronic kidney disease, stage 3 unspecified: Secondary | ICD-10-CM | POA: Diagnosis not present

## 2019-07-25 ENCOUNTER — Emergency Department (HOSPITAL_COMMUNITY)
Admission: EM | Admit: 2019-07-25 | Discharge: 2019-07-26 | Disposition: A | Discharge status: Home / Self Care | Payer: Medicare HMO | Attending: Emergency Medicine | Admitting: Emergency Medicine

## 2019-07-25 ENCOUNTER — Encounter (HOSPITAL_COMMUNITY): Payer: Self-pay

## 2019-07-25 ENCOUNTER — Other Ambulatory Visit: Payer: Self-pay

## 2019-07-25 DIAGNOSIS — R531 Weakness: Secondary | ICD-10-CM | POA: Insufficient documentation

## 2019-07-25 DIAGNOSIS — R55 Syncope and collapse: Secondary | ICD-10-CM | POA: Insufficient documentation

## 2019-07-25 DIAGNOSIS — Z5321 Procedure and treatment not carried out due to patient leaving prior to being seen by health care provider: Secondary | ICD-10-CM | POA: Insufficient documentation

## 2019-07-25 LAB — BASIC METABOLIC PANEL
Anion gap: 8 (ref 5–15)
BUN: 23 mg/dL (ref 8–23)
CO2: 24 mmol/L (ref 22–32)
Calcium: 9.1 mg/dL (ref 8.9–10.3)
Chloride: 103 mmol/L (ref 98–111)
Creatinine, Ser: 1.23 mg/dL (ref 0.61–1.24)
GFR calc Af Amer: >60 mL/min (ref >60)
GFR calc non Af Amer: 58 mL/min — ABNORMAL LOW (ref >60)
Glucose, Bld: 125 mg/dL — ABNORMAL HIGH (ref 70–99)
Potassium: 3.9 mmol/L (ref 3.5–5.1)
Sodium: 135 mmol/L (ref 135–145)

## 2019-07-25 LAB — CBC
HCT: 41.7 % (ref 39.0–52.0)
Hemoglobin: 14 g/dL (ref 13.0–17.0)
MCH: 30.1 pg (ref 26.0–34.0)
MCHC: 33.6 g/dL (ref 30.0–36.0)
MCV: 89.7 fL (ref 80.0–100.0)
Platelets: 259 10*3/uL (ref 150–400)
RBC: 4.65 MIL/uL (ref 4.22–5.81)
RDW: 12.4 % (ref 11.5–15.5)
WBC: 7.1 10*3/uL (ref 4.0–10.5)
nRBC: 0 % (ref 0.0–0.2)

## 2019-07-25 LAB — URINALYSIS, ROUTINE W REFLEX MICROSCOPIC
Bilirubin Urine: NEGATIVE
Glucose, UA: NEGATIVE mg/dL
Hgb urine dipstick: NEGATIVE
Ketones, ur: NEGATIVE mg/dL
Leukocytes,Ua: NEGATIVE
Nitrite: NEGATIVE
Protein, ur: NEGATIVE mg/dL
Specific Gravity, Urine: 1.011 (ref 1.005–1.030)
pH: 6 (ref 5.0–8.0)

## 2019-07-25 LAB — CBG MONITORING, ED: Glucose-Capillary: 115 mg/dL — ABNORMAL HIGH (ref 70–99)

## 2019-07-25 MED ORDER — SODIUM CHLORIDE 0.9% FLUSH: 3.0000 mL | Freq: Once | INTRAVENOUS | Status: DC

## 2019-07-25 NOTE — ED Notes (Signed)
Pt came to this tech stating that he was leaving. Pt was encouraged to stay, pt left.

## 2019-07-25 NOTE — ED Triage Notes (Signed)
Pt states around 1230pm today he passed out for about 30 seconds while sitting in a chair. Pt currently c.o generalized weakness. Denies pain, no neuro deficits noted. Pt a.o at this time CBG 115

## 2019-07-26 ENCOUNTER — Other Ambulatory Visit: Payer: Self-pay | Admitting: Internal Medicine

## 2019-07-26 DIAGNOSIS — R55 Syncope and collapse: Secondary | ICD-10-CM | POA: Diagnosis not present

## 2019-07-26 DIAGNOSIS — Z87898 Personal history of other specified conditions: Secondary | ICD-10-CM

## 2019-07-26 DIAGNOSIS — G459 Transient cerebral ischemic attack, unspecified: Secondary | ICD-10-CM | POA: Diagnosis not present

## 2019-07-26 DIAGNOSIS — R200 Anesthesia of skin: Secondary | ICD-10-CM | POA: Diagnosis not present

## 2019-07-28 DIAGNOSIS — G459 Transient cerebral ischemic attack, unspecified: Secondary | ICD-10-CM | POA: Diagnosis not present

## 2019-07-28 DIAGNOSIS — I10 Essential (primary) hypertension: Secondary | ICD-10-CM | POA: Diagnosis not present

## 2019-07-28 DIAGNOSIS — E7800 Pure hypercholesterolemia, unspecified: Secondary | ICD-10-CM | POA: Diagnosis not present

## 2019-07-28 DIAGNOSIS — R69 Illness, unspecified: Secondary | ICD-10-CM | POA: Diagnosis not present

## 2019-07-28 DIAGNOSIS — N183 Chronic kidney disease, stage 3 unspecified: Secondary | ICD-10-CM | POA: Diagnosis not present

## 2019-07-28 DIAGNOSIS — N400 Enlarged prostate without lower urinary tract symptoms: Secondary | ICD-10-CM | POA: Diagnosis not present

## 2019-07-28 DIAGNOSIS — E782 Mixed hyperlipidemia: Secondary | ICD-10-CM | POA: Diagnosis not present

## 2019-07-28 DIAGNOSIS — D649 Anemia, unspecified: Secondary | ICD-10-CM | POA: Diagnosis not present

## 2019-07-28 DIAGNOSIS — I2510 Atherosclerotic heart disease of native coronary artery without angina pectoris: Secondary | ICD-10-CM | POA: Diagnosis not present

## 2019-07-30 DIAGNOSIS — L905 Scar conditions and fibrosis of skin: Secondary | ICD-10-CM | POA: Diagnosis not present

## 2019-07-30 DIAGNOSIS — Z85820 Personal history of malignant melanoma of skin: Secondary | ICD-10-CM | POA: Diagnosis not present

## 2019-07-30 DIAGNOSIS — L821 Other seborrheic keratosis: Secondary | ICD-10-CM | POA: Diagnosis not present

## 2019-07-30 DIAGNOSIS — D485 Neoplasm of uncertain behavior of skin: Secondary | ICD-10-CM | POA: Diagnosis not present

## 2019-07-30 DIAGNOSIS — D1801 Hemangioma of skin and subcutaneous tissue: Secondary | ICD-10-CM | POA: Diagnosis not present

## 2019-07-30 DIAGNOSIS — D225 Melanocytic nevi of trunk: Secondary | ICD-10-CM | POA: Diagnosis not present

## 2019-07-31 ENCOUNTER — Ambulatory Visit
Admission: RE | Admit: 2019-07-31 | Discharge: 2019-07-31 | Disposition: A | Discharge status: Home / Self Care | Payer: Medicare HMO | Source: Ambulatory Visit | Attending: Internal Medicine | Admitting: Internal Medicine

## 2019-07-31 DIAGNOSIS — R42 Dizziness and giddiness: Secondary | ICD-10-CM | POA: Diagnosis not present

## 2019-07-31 DIAGNOSIS — R55 Syncope and collapse: Secondary | ICD-10-CM | POA: Diagnosis not present

## 2019-07-31 DIAGNOSIS — Z87898 Personal history of other specified conditions: Secondary | ICD-10-CM

## 2019-08-01 DIAGNOSIS — R55 Syncope and collapse: Secondary | ICD-10-CM | POA: Diagnosis not present

## 2019-08-01 DIAGNOSIS — I6523 Occlusion and stenosis of bilateral carotid arteries: Secondary | ICD-10-CM | POA: Diagnosis not present

## 2019-08-09 DIAGNOSIS — R55 Syncope and collapse: Secondary | ICD-10-CM | POA: Diagnosis not present

## 2019-08-09 DIAGNOSIS — G459 Transient cerebral ischemic attack, unspecified: Secondary | ICD-10-CM | POA: Diagnosis not present

## 2019-08-09 DIAGNOSIS — Z87898 Personal history of other specified conditions: Secondary | ICD-10-CM | POA: Diagnosis not present

## 2019-08-09 DIAGNOSIS — R200 Anesthesia of skin: Secondary | ICD-10-CM | POA: Diagnosis not present

## 2019-08-23 DIAGNOSIS — R931 Abnormal findings on diagnostic imaging of heart and coronary circulation: Secondary | ICD-10-CM | POA: Diagnosis not present

## 2019-08-27 DIAGNOSIS — R69 Illness, unspecified: Secondary | ICD-10-CM | POA: Diagnosis not present

## 2019-08-27 DIAGNOSIS — I10 Essential (primary) hypertension: Secondary | ICD-10-CM | POA: Diagnosis not present

## 2019-08-27 DIAGNOSIS — I2510 Atherosclerotic heart disease of native coronary artery without angina pectoris: Secondary | ICD-10-CM | POA: Diagnosis not present

## 2019-08-27 DIAGNOSIS — E782 Mixed hyperlipidemia: Secondary | ICD-10-CM | POA: Diagnosis not present

## 2019-08-27 DIAGNOSIS — G459 Transient cerebral ischemic attack, unspecified: Secondary | ICD-10-CM | POA: Diagnosis not present

## 2019-08-27 DIAGNOSIS — N400 Enlarged prostate without lower urinary tract symptoms: Secondary | ICD-10-CM | POA: Diagnosis not present

## 2019-08-27 DIAGNOSIS — N1330 Unspecified hydronephrosis: Secondary | ICD-10-CM | POA: Diagnosis not present

## 2019-10-11 DIAGNOSIS — M778 Other enthesopathies, not elsewhere classified: Secondary | ICD-10-CM | POA: Diagnosis not present

## 2019-10-11 DIAGNOSIS — M19011 Primary osteoarthritis, right shoulder: Secondary | ICD-10-CM | POA: Diagnosis not present

## 2019-10-11 DIAGNOSIS — M7541 Impingement syndrome of right shoulder: Secondary | ICD-10-CM | POA: Diagnosis not present

## 2019-12-03 DIAGNOSIS — G459 Transient cerebral ischemic attack, unspecified: Secondary | ICD-10-CM | POA: Diagnosis not present

## 2019-12-03 DIAGNOSIS — E782 Mixed hyperlipidemia: Secondary | ICD-10-CM | POA: Diagnosis not present

## 2019-12-03 DIAGNOSIS — R69 Illness, unspecified: Secondary | ICD-10-CM | POA: Diagnosis not present

## 2019-12-03 DIAGNOSIS — E7800 Pure hypercholesterolemia, unspecified: Secondary | ICD-10-CM | POA: Diagnosis not present

## 2019-12-03 DIAGNOSIS — N183 Chronic kidney disease, stage 3 unspecified: Secondary | ICD-10-CM | POA: Diagnosis not present

## 2019-12-03 DIAGNOSIS — N1330 Unspecified hydronephrosis: Secondary | ICD-10-CM | POA: Diagnosis not present

## 2019-12-03 DIAGNOSIS — I10 Essential (primary) hypertension: Secondary | ICD-10-CM | POA: Diagnosis not present

## 2019-12-03 DIAGNOSIS — I2510 Atherosclerotic heart disease of native coronary artery without angina pectoris: Secondary | ICD-10-CM | POA: Diagnosis not present

## 2019-12-03 DIAGNOSIS — D649 Anemia, unspecified: Secondary | ICD-10-CM | POA: Diagnosis not present

## 2019-12-03 DIAGNOSIS — N400 Enlarged prostate without lower urinary tract symptoms: Secondary | ICD-10-CM | POA: Diagnosis not present

## 2019-12-25 DIAGNOSIS — I10 Essential (primary) hypertension: Secondary | ICD-10-CM | POA: Diagnosis not present

## 2019-12-25 DIAGNOSIS — E782 Mixed hyperlipidemia: Secondary | ICD-10-CM | POA: Diagnosis not present

## 2019-12-25 DIAGNOSIS — N401 Enlarged prostate with lower urinary tract symptoms: Secondary | ICD-10-CM | POA: Diagnosis not present

## 2019-12-25 DIAGNOSIS — N400 Enlarged prostate without lower urinary tract symptoms: Secondary | ICD-10-CM | POA: Diagnosis not present

## 2019-12-25 DIAGNOSIS — I2510 Atherosclerotic heart disease of native coronary artery without angina pectoris: Secondary | ICD-10-CM | POA: Diagnosis not present

## 2019-12-25 DIAGNOSIS — D649 Anemia, unspecified: Secondary | ICD-10-CM | POA: Diagnosis not present

## 2019-12-25 DIAGNOSIS — R69 Illness, unspecified: Secondary | ICD-10-CM | POA: Diagnosis not present

## 2019-12-25 DIAGNOSIS — N1330 Unspecified hydronephrosis: Secondary | ICD-10-CM | POA: Diagnosis not present

## 2020-01-03 DIAGNOSIS — R69 Illness, unspecified: Secondary | ICD-10-CM | POA: Diagnosis not present

## 2020-01-03 DIAGNOSIS — I10 Essential (primary) hypertension: Secondary | ICD-10-CM | POA: Diagnosis not present

## 2020-01-03 DIAGNOSIS — Z79899 Other long term (current) drug therapy: Secondary | ICD-10-CM | POA: Diagnosis not present

## 2020-01-03 DIAGNOSIS — E782 Mixed hyperlipidemia: Secondary | ICD-10-CM | POA: Diagnosis not present

## 2020-01-03 DIAGNOSIS — Z0001 Encounter for general adult medical examination with abnormal findings: Secondary | ICD-10-CM | POA: Diagnosis not present

## 2020-01-03 DIAGNOSIS — E559 Vitamin D deficiency, unspecified: Secondary | ICD-10-CM | POA: Diagnosis not present

## 2020-01-03 DIAGNOSIS — N401 Enlarged prostate with lower urinary tract symptoms: Secondary | ICD-10-CM | POA: Diagnosis not present

## 2020-01-03 DIAGNOSIS — B009 Herpesviral infection, unspecified: Secondary | ICD-10-CM | POA: Diagnosis not present

## 2020-01-03 DIAGNOSIS — D649 Anemia, unspecified: Secondary | ICD-10-CM | POA: Diagnosis not present

## 2020-01-03 DIAGNOSIS — I2510 Atherosclerotic heart disease of native coronary artery without angina pectoris: Secondary | ICD-10-CM | POA: Diagnosis not present

## 2020-01-16 ENCOUNTER — Ambulatory Visit (INDEPENDENT_AMBULATORY_CARE_PROVIDER_SITE_OTHER): Payer: Medicare HMO | Admitting: Cardiology

## 2020-01-16 ENCOUNTER — Encounter: Payer: Self-pay | Admitting: Cardiology

## 2020-01-16 ENCOUNTER — Other Ambulatory Visit: Payer: Self-pay

## 2020-01-16 VITALS — BP 90/60 | HR 60 | Ht 70.0 in | Wt 189.0 lb

## 2020-01-16 DIAGNOSIS — E782 Mixed hyperlipidemia: Secondary | ICD-10-CM | POA: Diagnosis not present

## 2020-01-16 DIAGNOSIS — I2510 Atherosclerotic heart disease of native coronary artery without angina pectoris: Secondary | ICD-10-CM | POA: Diagnosis not present

## 2020-01-16 DIAGNOSIS — I10 Essential (primary) hypertension: Secondary | ICD-10-CM | POA: Diagnosis not present

## 2020-01-16 DIAGNOSIS — I700 Atherosclerosis of aorta: Secondary | ICD-10-CM | POA: Diagnosis not present

## 2020-01-16 NOTE — Progress Notes (Signed)
Cardiology Office Note:    Date:  01/16/2020   ID:  Lucas Hardy, DOB 1945/11/16, MRN [id number]  PCP:  Lucas Huddle, MD  [facility] Cardiologist:  Candee Furbish, MD  [facility] Electrophysiologist:  None   Referring MD: Lucas Huddle, MD     History of Present Illness:    Lucas Hardy is a 74 y.o. male follow-up of coronary artery calcifications, mitral regurgitation.  Had moderate mitral gravitation from an echocardiogram in 2018.  Cardiac catheterization in 2019 showed mild nonobstructive disease.  Walks 3 miles a day.  After walk can have low BP 90's for instance.   Once had syncope at church 07/2019 while standing. Friend caught him. If give blood have passed out.   Past Medical History:  Diagnosis Date  . Anxiety   . Aortic atherosclerosis ([place])   . Arthritis    degenerative of hand,  lumbar,  elbow  . Benign essential tremor   . Bladder outlet obstruction   . BPH without urinary obstruction   . CKD (chronic kidney disease), stage III ([place])   . Coronary artery disease    cardiologist-  dr Marlou Porch--  per cardiac cath 05-31-2017  mild nonobstructive cad w/ normal lvedp  . Depression   . Diastolic murmur   . ED (erectile dysfunction)   . Fatigue   . Foley catheter in place   . GERD (gastroesophageal reflux disease)   . Hemorrhoids   . History of exercise stress test 05-26-2017   dr Marlou Porch   abnormal test , ischemic changes;  cardiac cath done 05-31-2017  . History of kidney stones   . History of melanoma excision    left lower extremity in 2015 approx.-- early malignant   . History of pituitary tumor 1997   s/p  resection benign tumor  . History of pyelonephritis 07/29/2017   due to UTI  . Hydronephrosis, right   . Hypertension   . MVP (mitral valve prolapse)    w/ grade 2 regurg.  per echo 03-09-2017  . Radiculopathy of lumbar region   . Restless legs syndrome (RLS)   . Solitary lung nodule    last CT 06-03-2016 stable, benign  . Vitamin D  deficiency   . Wears glasses   . Wears hearing aid in both ears     Past Surgical History:  Procedure Laterality Date  . CYSTOSCOPY WITH STENT PLACEMENT Right 06/22/2017   Procedure: CYSTOSCOPY WITH RIGHT URETERAL STENT PLACEMENT;  Surgeon: Raynelle Bring, MD;  Location: [facility];  Service: Urology;  Laterality: Right;  . CYSTOSCOPY/URETEROSCOPY/HOLMIUM LASER/STENT PLACEMENT Right 08/18/2017   Procedure: CYSTOSCOPIY/RIGHT RETROGRADE PYLOGRAM/RIGHT URETEROSCOPY;  Surgeon: Ardis Hughs, MD;  Location: [facility];  Service: Urology;  Laterality: Right;  . FINGER SURGERY Left ?   reattach left index finger amputation  . INGUINAL HERNIA REPAIR Left 1998  approx.  Marland Kitchen LEFT HEART CATH AND CORONARY ANGIOGRAPHY N/A 05/31/2017   Procedure: LEFT HEART CATH AND CORONARY ANGIOGRAPHY;  Surgeon: Martinique, Peter M, MD;  Location: [place] [facility];  Service: Cardiovascular;  Laterality: N/A;  abnormal ETT;  minor nonobstructive CAD (dRCA 20%, post astrio 30%), low LVEDP  . TONSILLECTOMY  ~ 1963  . TRANSPHENOIDAL PITUITARY RESECTION  1997  . TRANSTHORACIC ECHOCARDIOGRAM  03/10/2017   mild concentric LVH, ef 35-00%, grade 1 diastolic dysfunction/ mild dilated left artrial cavity/ mild myxomatous degeneration and bileaflet prolapse with moderate (grade 1) MR/  mild TR/  trace AR and PR  . TRANSURETHRAL  RESECTION OF PROSTATE N/A 08/18/2017   Procedure: TRANSURETHRAL RESECTION OF THE PROSTATE (TURP);  Surgeon: Ardis Hughs, MD;  Location: [facility];  Service: Urology;  Laterality: N/A;    Current Medications: Current Meds  Medication Sig  . acetaminophen (TYLENOL) 500 MG tablet Take 1,000 mg by mouth daily as needed for fever.  Marland Kitchen aspirin 81 MG tablet Take 1 tablet (81 mg total) by mouth daily. Resume once there is no blood in your urine for 48 hours  . Cholecalciferol 2000 units CAPS Take 2,000 Units by mouth daily.  Marland Kitchen FLUoxetine (PROZAC) 40 MG capsule Take 40 mg by  mouth every morning.  . Multiple Vitamin (MULTIVITAMIN WITH MINERALS) TABS tablet Take 1 tablet by mouth daily.  . rosuvastatin (CRESTOR) 20 MG tablet Take 20 mg by mouth every morning.   . valsartan (DIOVAN) 80 MG tablet Take 80 mg by mouth 2 (two) times daily.  . [DISCONTINUED] amLODipine (NORVASC) 5 MG tablet Take 5 mg by mouth every morning.      Allergies:   Effexor [venlafaxine] and Ciprofloxacin   Social History   Socioeconomic History  . Marital status: Married    Spouse name: Not on file  . Number of children: Not on file  . Years of education: Not on file  . Highest education level: Not on file  Occupational History  . Not on file  Tobacco Use  . Smoking status: Former Smoker    Packs/day: 1.00    Years: 15.00    Pack years: 15.00    Types: Cigarettes    Quit date: 04/11/1978    Years since quitting: 41.7  . Smokeless tobacco: Former Systems developer    Types: [place] date: 08/13/1998  Vaping Use  . Vaping Use: Never used  Substance and Sexual Activity  . Alcohol use: Yes    Comment: rare  . Drug use: No  . Sexual activity: Not Currently  Other Topics Concern  . Not on file  Social History Narrative  . Not on file   Social Determinants of Health   Financial Resource Strain:   . Difficulty of Paying Living Expenses: Not on file  Food Insecurity:   . Worried About Charity fundraiser in the Last Year: Not on file  . Ran Out of Food in the Last Year: Not on file  Transportation Needs:   . Lack of Transportation (Medical): Not on file  . Lack of Transportation (Non-Medical): Not on file  Physical Activity:   . Days of Exercise per Week: Not on file  . Minutes of Exercise per Session: Not on file  Stress:   . Feeling of Stress : Not on file  Social Connections:   . Frequency of Communication with Friends and Family: Not on file  . Frequency of Social Gatherings with Friends and Family: Not on file  . Attends Religious Services: Not on file  . Active Member of  Clubs or Organizations: Not on file  . Attends Archivist Meetings: Not on file  . Marital Status: Not on file     Family History: The patient's family history includes Hypertension in his father.  ROS:   Please see the history of present illness.     All other systems reviewed and are negative.  EKGs/Labs/Other Studies Reviewed:    The following studies were reviewed today:  ECHO 05/29/2018   1. The left ventricle has normal systolic function with an ejection  fraction of 60-65%.  The cavity size was normal. Left ventricular diastolic  Doppler parameters are consistent with impaired relaxation.  2. The mitral valve is normal in structure. Mild thickening of the mitral  valve leaflet.  3. The tricuspid valve is normal in structure.  4. The aortic valve is tricuspid Mild thickening of the aortic valve.  5. The pulmonic valve was normal in structure. Pulmonic valve  regurgitation is mild by color flow Doppler.  6. There is evidence of plaque in the ascending aorta.  7. There is mild dilatation of the aortic root measuring 40 mm.    EKG from ER personally reviewed from 07/25/2019 and demonstrates sinus rhythm with no other abnormalities.  Recent Labs: 07/25/2019: BUN 23; Creatinine, Ser 1.23; Hemoglobin 14.0; Platelets 259; Potassium 3.9; Sodium 135  Recent Lipid Panel No results found for: CHOL, TRIG, HDL, CHOLHDL, VLDL, LDLCALC, LDLDIRECT   Risk Assessment/Calculations:       Physical Exam:    VS:  BP 90/60   Pulse 60   Ht 5\' 10"  (1.778 m)   Wt 189 lb (85.7 kg)   SpO2 94%   BMI 27.12 kg/m     Wt Readings from Last 3 Encounters:  01/16/20 189 lb (85.7 kg)  07/25/19 195 lb (88.5 kg)  05/16/18 196 lb 9.6 oz (89.2 kg)     GEN:  Well nourished, well developed in no acute distress HEENT: Normal NECK: No JVD; No carotid bruits LYMPHATICS: No lymphadenopathy CARDIAC: RRR, no murmurs, rubs, gallops RESPIRATORY:  Clear to auscultation without rales,  wheezing or rhonchi  ABDOMEN: Soft, non-tender, non-distended MUSCULOSKELETAL:  No edema; No deformity  SKIN: Warm and dry NEUROLOGIC:  Alert and oriented x 3 PSYCHIATRIC:  Normal affect   ASSESSMENT:    1. Coronary artery disease involving native coronary artery of native heart without angina pectoris   2. Essential hypertension   3. Aortic atherosclerosis ([place])   4. Mixed hyperlipidemia    PLAN:    In order of problems listed above:  Hypotension -Blood pressure today 90/60.  He had an episode of syncope after reviewing ER notes in April 2021.  He notes that his blood pressure after walking 3 miles on the treadmill at the gym can be in the 90s for a while after exercise. -I would like for him to discontinue his amlodipine 5 mg.  Okay to continue valsartan 80 mg twice a day. -He will continue to monitor his blood pressures closely  Coronary artery calcification -Calcified plaque noted previously on CT scan.  No high risk lesions on prior cardiac catheterization.  Nonobstructive disease.  Continue with Crestor.  Aspirin.  Aortic atherosclerosis -Continue with aggressive secondary prevention.  Prior mitral regurgitation with bileaflet prolapse -Mild thickening of the leaflets were noted previously.   Medication Adjustments/Labs and Tests Ordered: Current medicines are reviewed at length with the patient today.  Concerns regarding medicines are outlined above.  No orders of the defined types were placed in this encounter.  No orders of the defined types were placed in this encounter.   Patient Instructions  Medication Instructions: Your physician has recommended you make the following change in your medication:  1-STOP amlodipine   *If you need a refill on your cardiac medications before your next appointment, please call your pharmacy*  Lab Work: If you have labs (blood work) drawn today and your tests are completely normal, you will receive your results only  by: Marland Kitchen MyChart Message (if you have MyChart) OR . A paper copy in the mail  If you have any lab test that is abnormal or we need to change your treatment, we will call you to review the results.  Testing/Procedures: None ordered today.   Follow-Up: At [facility], you and your health needs are our priority.  As part of our continuing mission to provide you with exceptional heart care, we have created designated Provider Care Teams.  These Care Teams include your primary Cardiologist (physician) and Advanced Practice Providers (APPs -  Physician Assistants and Nurse Practitioners) who all work together to provide you with the care you need, when you need it.  We recommend signing up for the patient portal called "MyChart".  Sign up information is provided on this After Visit Summary.  MyChart is used to connect with patients for Virtual Visits (Telemedicine).  Patients are able to view lab/test results, encounter notes, upcoming appointments, etc.  Non-urgent messages can be sent to your provider as well.   To learn more about what you can do with MyChart, go to [email or web address].    Your next appointment:   2 month(s)  The format for your next appointment:   In Person  Provider:   You may see Candee Furbish, MD or one of the following Advanced Practice Providers on your designated Care Team:    Truitt Merle, NP  Cecilie Kicks, NP  Kathyrn Drown, NP   Please keep a log of your daily blood pressures.     Signed, Candee Furbish, MD  01/16/2020 5:08 PM    [place]

## 2020-01-16 NOTE — Patient Instructions (Addendum)
Medication Instructions: Your physician has recommended you make the following change in your medication:  1-STOP amlodipine   *If you need a refill on your cardiac medications before your next appointment, please call your pharmacy*  Lab Work: If you have labs (blood work) drawn today and your tests are completely normal, you will receive your results only by: Marland Kitchen MyChart Message (if you have MyChart) OR . A paper copy in the mail If you have any lab test that is abnormal or we need to change your treatment, we will call you to review the results.  Testing/Procedures: None ordered today.   Follow-Up: At [facility], you and your health needs are our priority.  As part of our continuing mission to provide you with exceptional heart care, we have created designated Provider Care Teams.  These Care Teams include your primary Cardiologist (physician) and Advanced Practice Providers (APPs -  Physician Assistants and Nurse Practitioners) who all work together to provide you with the care you need, when you need it.  We recommend signing up for the patient portal called "MyChart".  Sign up information is provided on this After Visit Summary.  MyChart is used to connect with patients for Virtual Visits (Telemedicine).  Patients are able to view lab/test results, encounter notes, upcoming appointments, etc.  Non-urgent messages can be sent to your provider as well.   To learn more about what you can do with MyChart, go to [email or web address].    Your next appointment:   2 month(s)  The format for your next appointment:   In Person  Provider:   You may see Candee Furbish, MD or one of the following Advanced Practice Providers on your designated Care Team:    Truitt Merle, NP  Cecilie Kicks, NP  Kathyrn Drown, NP   Please keep a log of your daily blood pressures.

## 2020-01-29 DIAGNOSIS — L814 Other melanin hyperpigmentation: Secondary | ICD-10-CM | POA: Diagnosis not present

## 2020-01-29 DIAGNOSIS — L821 Other seborrheic keratosis: Secondary | ICD-10-CM | POA: Diagnosis not present

## 2020-01-29 DIAGNOSIS — Z85820 Personal history of malignant melanoma of skin: Secondary | ICD-10-CM | POA: Diagnosis not present

## 2020-01-29 DIAGNOSIS — D225 Melanocytic nevi of trunk: Secondary | ICD-10-CM | POA: Diagnosis not present

## 2020-01-29 DIAGNOSIS — L905 Scar conditions and fibrosis of skin: Secondary | ICD-10-CM | POA: Diagnosis not present

## 2020-01-29 DIAGNOSIS — L570 Actinic keratosis: Secondary | ICD-10-CM | POA: Diagnosis not present

## 2020-01-29 DIAGNOSIS — L578 Other skin changes due to chronic exposure to nonionizing radiation: Secondary | ICD-10-CM | POA: Diagnosis not present

## 2020-02-05 DIAGNOSIS — D649 Anemia, unspecified: Secondary | ICD-10-CM | POA: Diagnosis not present

## 2020-02-05 DIAGNOSIS — E782 Mixed hyperlipidemia: Secondary | ICD-10-CM | POA: Diagnosis not present

## 2020-02-05 DIAGNOSIS — R69 Illness, unspecified: Secondary | ICD-10-CM | POA: Diagnosis not present

## 2020-02-05 DIAGNOSIS — N183 Chronic kidney disease, stage 3 unspecified: Secondary | ICD-10-CM | POA: Diagnosis not present

## 2020-02-05 DIAGNOSIS — N401 Enlarged prostate with lower urinary tract symptoms: Secondary | ICD-10-CM | POA: Diagnosis not present

## 2020-02-05 DIAGNOSIS — G459 Transient cerebral ischemic attack, unspecified: Secondary | ICD-10-CM | POA: Diagnosis not present

## 2020-02-05 DIAGNOSIS — I10 Essential (primary) hypertension: Secondary | ICD-10-CM | POA: Diagnosis not present

## 2020-02-05 DIAGNOSIS — N1330 Unspecified hydronephrosis: Secondary | ICD-10-CM | POA: Diagnosis not present

## 2020-02-05 DIAGNOSIS — N400 Enlarged prostate without lower urinary tract symptoms: Secondary | ICD-10-CM | POA: Diagnosis not present

## 2020-02-05 DIAGNOSIS — E7800 Pure hypercholesterolemia, unspecified: Secondary | ICD-10-CM | POA: Diagnosis not present

## 2020-03-12 ENCOUNTER — Encounter: Payer: Self-pay | Admitting: Cardiology

## 2020-03-12 ENCOUNTER — Other Ambulatory Visit: Payer: Self-pay

## 2020-03-12 ENCOUNTER — Ambulatory Visit: Payer: Medicare HMO | Admitting: Cardiology

## 2020-03-12 VITALS — BP 100/60 | HR 57 | Ht 70.0 in | Wt 189.0 lb

## 2020-03-12 DIAGNOSIS — I700 Atherosclerosis of aorta: Secondary | ICD-10-CM | POA: Diagnosis not present

## 2020-03-12 DIAGNOSIS — I341 Nonrheumatic mitral (valve) prolapse: Secondary | ICD-10-CM | POA: Diagnosis not present

## 2020-03-12 NOTE — Progress Notes (Signed)
Cardiology Office Note:    Date:  03/12/2020   ID:  Lucas Hardy, DOB 06-07-1945, MRN [id number]  PCP:  Josetta Huddle, MD  [facility] Cardiologist:  Candee Furbish, MD  [facility] Electrophysiologist:  None   Referring MD: Josetta Huddle, MD    History of Present Illness:    Lucas Hardy is a 74 y.o. male here for the follow-up of hypotension.  At prior visit his blood pressure was 90/60 on 01/16/2020.  He had an episode of syncope after reviewing ER notes from April 2021.  He notes that his blood pressure after walking on a treadmill for 3 miles at the gym can be in the 90s for a while after exercise.  I discontinued his amlodipine 5 mg and continued with the HCTZ 12.5 in AM valsartan 80 mg at night.  He also has coronary plaque noted on CT scan.  Prior cardiac catheterization performed on 05/31/2017 showed no high risk lesions.  Nonobstructive disease.  Past Medical History:  Diagnosis Date  . Anxiety   . Aortic atherosclerosis ([place])   . Arthritis    degenerative of hand,  lumbar,  elbow  . Benign essential tremor   . Bladder outlet obstruction   . BPH without urinary obstruction   . CKD (chronic kidney disease), stage III ([place])   . Coronary artery disease    cardiologist-  dr Marlou Porch--  per cardiac cath 05-31-2017  mild nonobstructive cad w/ normal lvedp  . Depression   . Diastolic murmur   . ED (erectile dysfunction)   . Fatigue   . Foley catheter in place   . GERD (gastroesophageal reflux disease)   . Hemorrhoids   . History of exercise stress test 05-26-2017   dr Marlou Porch   abnormal test , ischemic changes;  cardiac cath done 05-31-2017  . History of kidney stones   . History of melanoma excision    left lower extremity in 2015 approx.-- early malignant   . History of pituitary tumor 1997   s/p  resection benign tumor  . History of pyelonephritis 07/29/2017   due to UTI  . Hydronephrosis, right   . Hypertension   . MVP (mitral valve prolapse)    w/ grade  2 regurg.  per echo 03-09-2017  . Radiculopathy of lumbar region   . Restless legs syndrome (RLS)   . Solitary lung nodule    last CT 06-03-2016 stable, benign  . Vitamin D deficiency   . Wears glasses   . Wears hearing aid in both ears     Past Surgical History:  Procedure Laterality Date  . CYSTOSCOPY WITH STENT PLACEMENT Right 06/22/2017   Procedure: CYSTOSCOPY WITH RIGHT URETERAL STENT PLACEMENT;  Surgeon: Raynelle Bring, MD;  Location: [facility];  Service: Urology;  Laterality: Right;  . CYSTOSCOPY/URETEROSCOPY/HOLMIUM LASER/STENT PLACEMENT Right 08/18/2017   Procedure: CYSTOSCOPIY/RIGHT RETROGRADE PYLOGRAM/RIGHT URETEROSCOPY;  Surgeon: Ardis Hughs, MD;  Location: [facility];  Service: Urology;  Laterality: Right;  . FINGER SURGERY Left ?   reattach left index finger amputation  . INGUINAL HERNIA REPAIR Left 1998  approx.  Marland Kitchen LEFT HEART CATH AND CORONARY ANGIOGRAPHY N/A 05/31/2017   Procedure: LEFT HEART CATH AND CORONARY ANGIOGRAPHY;  Surgeon: Martinique, Peter M, MD;  Location: [facility];  Service: Cardiovascular;  Laterality: N/A;  abnormal ETT;  minor nonobstructive CAD (dRCA 20%, post astrio 30%), low LVEDP  . TONSILLECTOMY  ~ 1963  . TRANSPHENOIDAL PITUITARY RESECTION  1997  . TRANSTHORACIC  ECHOCARDIOGRAM  03/10/2017   mild concentric LVH, ef 44-31%, grade 1 diastolic dysfunction/ mild dilated left artrial cavity/ mild myxomatous degeneration and bileaflet prolapse with moderate (grade 1) MR/  mild TR/  trace AR and PR  . TRANSURETHRAL RESECTION OF PROSTATE N/A 08/18/2017   Procedure: TRANSURETHRAL RESECTION OF THE PROSTATE (TURP);  Surgeon: Ardis Hughs, MD;  Location: [facility];  Service: Urology;  Laterality: N/A;    Current Medications: Current Meds  Medication Sig  . acetaminophen (TYLENOL) 500 MG tablet Take 1,000 mg by mouth daily as needed for fever.  Marland Kitchen aspirin 81 MG tablet Take 1 tablet (81 mg total) by mouth daily.  Resume once there is no blood in your urine for 48 hours  . Cholecalciferol 2000 units CAPS Take 2,000 Units by mouth daily.  Marland Kitchen FLUoxetine (PROZAC) 40 MG capsule Take 40 mg by mouth every morning.  . hydrochlorothiazide (HYDRODIURIL) 12.5 MG tablet Take 12.5 mg by mouth daily.  . Multiple Vitamin (MULTIVITAMIN WITH MINERALS) TABS tablet Take 1 tablet by mouth daily.  . rosuvastatin (CRESTOR) 20 MG tablet Take 20 mg by mouth every morning.   . valsartan (DIOVAN) 80 MG tablet Take 80 mg by mouth 2 (two) times daily.     Allergies:   Effexor [venlafaxine] and Ciprofloxacin   Social History   Socioeconomic History  . Marital status: Married    Spouse name: Not on file  . Number of children: Not on file  . Years of education: Not on file  . Highest education level: Not on file  Occupational History  . Not on file  Tobacco Use  . Smoking status: Former Smoker    Packs/day: 1.00    Years: 15.00    Pack years: 15.00    Types: Cigarettes    Quit date: 04/11/1978    Years since quitting: 41.9  . Smokeless tobacco: Former Systems developer    Types: [place] [employer] date: 08/13/1998  Vaping Use  . Vaping Use: Never used  Substance and Sexual Activity  . Alcohol use: Yes    Comment: rare  . Drug use: No  . Sexual activity: Not Currently  Other Topics Concern  . Not on file  Social History Narrative  . Not on file   Social Determinants of Health   Financial Resource Strain:   . Difficulty of Paying Living Expenses: Not on file  Food Insecurity:   . Worried About Charity fundraiser in the Last Year: Not on file  . Ran Out of Food in the Last Year: Not on file  Transportation Needs:   . Lack of Transportation (Medical): Not on file  . Lack of Transportation (Non-Medical): Not on file  Physical Activity:   . Days of Exercise per Week: Not on file  . Minutes of Exercise per Session: Not on file  Stress:   . Feeling of Stress : Not on file  Social Connections:   . Frequency of Communication  with Friends and Family: Not on file  . Frequency of Social Gatherings with Friends and Family: Not on file  . Attends Religious Services: Not on file  . Active Member of Clubs or Organizations: Not on file  . Attends Archivist Meetings: Not on file  . Marital Status: Not on file     Family History: The patient's family history includes Hypertension in his father.  ROS:   Please see the history of present illness.    No fevers chills  nausea vomiting syncope bleeding all other systems reviewed and are negative.  EKGs/Labs/Other Studies Reviewed:    The following studies were reviewed today:  ECHO 05/29/2018  1. The left ventricle has normal systolic function with an ejection  fraction of 60-65%. The cavity size was normal. Left ventricular diastolic  Doppler parameters are consistent with impaired relaxation.  2. The mitral valve is normal in structure. Mild thickening of the mitral  valve leaflet.  3. The tricuspid valve is normal in structure.  4. The aortic valve is tricuspid Mild thickening of the aortic valve.  5. The pulmonic valve was normal in structure. Pulmonic valve  regurgitation is mild by color flow Doppler.  6. There is evidence of plaque in the ascending aorta.  7. There is mild dilatation of the aortic root measuring 40 mm.    Recent Labs: 07/25/2019: BUN 23; Creatinine, Ser 1.23; Hemoglobin 14.0; Platelets 259; Potassium 3.9; Sodium 135  Recent Lipid Panel No results found for: CHOL, TRIG, HDL, CHOLHDL, VLDL, LDLCALC, LDLDIRECT   Risk Assessment/Calculations:       Physical Exam:    VS:  BP 100/60 (BP Location: Left Arm, Patient Position: Sitting, Cuff Size: Normal)   Pulse (!) 57   Ht 5\' 10"  (1.778 m)   Wt 189 lb (85.7 kg)   SpO2 97%   BMI 27.12 kg/m     Wt Readings from Last 3 Encounters:  03/12/20 189 lb (85.7 kg)  01/16/20 189 lb (85.7 kg)  07/25/19 195 lb (88.5 kg)     GEN:  Well nourished, well developed in no acute  distress HEENT: Normal NECK: No JVD; No carotid bruits LYMPHATICS: No lymphadenopathy CARDIAC: RRR, no murmurs, rubs, gallops RESPIRATORY:  Clear to auscultation without rales, wheezing or rhonchi  ABDOMEN: Soft, non-tender, non-distended MUSCULOSKELETAL:  No edema; No deformity  SKIN: Warm and dry NEUROLOGIC:  Alert and oriented x 3 PSYCHIATRIC:  Normal affect   ASSESSMENT:    1. Mitral valve prolapse   2. Aortic atherosclerosis (HCC)    PLAN:    In order of problems listed above:  Hypotension -improved.  Several blood pressure readings reviewed.  Excellent.  No further syncope type symptoms.  Continue with a.m. HCTZ 12.5 and valsartan 80 at night.  He is off of amlodipine.  Coronary artery calcification nonobstructive disease/aortic atherosclerosis -Continue with Crestor, aspirin, secondary risk factor prevention -Cardiac catheterization 2019  Mitral regurgitation with bileaflet prolapse -Echocardiogram reviewed as above.  Continue to monitor clinically.  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  Repeat echocardiogram   Shared Decision Making/Informed Consent        Medication Adjustments/Labs and Tests Ordered: Current medicines are reviewed at length with the patient today.  Concerns regarding medicines are outlined above.  Orders Placed This Encounter  Procedures  . ECHOCARDIOGRAM COMPLETE   No orders of the defined types were placed in this encounter.   Patient Instructions  Medication Instructions:  Your physician recommends that you continue on your current medications as directed. Please refer to the Current Medication list given to you today.  *If you need a refill on your cardiac medications before your next appointment, please call your pharmacy*   Lab Work: None Ordered If you have labs (blood work) drawn today and your tests are completely normal, you will receive your results only by: Marland Kitchen MyChart Message (if you have MyChart) OR . A paper copy in the  mail If you have any lab test that is abnormal or we need to change your  treatment, we will call you to review the results.   Testing/Procedures: Your physician has requested that you have an echocardiogram. Echocardiography is a painless test that uses sound waves to create images of your heart. It provides your doctor with information about the size and shape of your heart and how well your heart's chambers and valves are working. This procedure takes approximately one hour. There are no restrictions for this procedure.     Follow-Up: At [facility], you and your health needs are our priority.  As part of our continuing mission to provide you with exceptional heart care, we have created designated Provider Care Teams.  These Care Teams include your primary Cardiologist (physician) and Advanced Practice Providers (APPs -  Physician Assistants and Nurse Practitioners) who all work together to provide you with the care you need, when you need it.   do with MyChart, go to [email or web address].    Your next appointment:   1 year(s)  The format for your next appointment:   In Person  Provider:   You may see Candee Furbish, MD or one of the following Advanced Practice Providers on your designated Care Team:    Truitt Merle, NP  Cecilie Kicks, NP  Kathyrn Drown, NP         Signed, Candee Furbish, MD  03/12/2020 11:36 AM    [place]

## 2020-03-12 NOTE — Patient Instructions (Signed)
Medication Instructions:  Your physician recommends that you continue on your current medications as directed. Please refer to the Current Medication list given to you today.  *If you need a refill on your cardiac medications before your next appointment, please call your pharmacy*   Lab Work: None Ordered If you have labs (blood work) drawn today and your tests are completely normal, you will receive your results only by: Marland Kitchen MyChart Message (if you have MyChart) OR . A paper copy in the mail If you have any lab test that is abnormal or we need to change your treatment, we will call you to review the results.   Testing/Procedures: Your physician has requested that you have an echocardiogram. Echocardiography is a painless test that uses sound waves to create images of your heart. It provides your doctor with information about the size and shape of your heart and how well your heart's chambers and valves are working. This procedure takes approximately one hour. There are no restrictions for this procedure.     Follow-Up: At [facility], you and your health needs are our priority.  As part of our continuing mission to provide you with exceptional heart care, we have created designated Provider Care Teams.  These Care Teams include your primary Cardiologist (physician) and Advanced Practice Providers (APPs -  Physician Assistants and Nurse Practitioners) who all work together to provide you with the care you need, when you need it.   do with MyChart, go to [email or web address].    Your next appointment:   1 year(s)  The format for your next appointment:   In Person  Provider:   You may see Candee Furbish, MD or one of the following Advanced Practice Providers on your designated Care Team:    Truitt Merle, NP  Cecilie Kicks, NP  Kathyrn Drown, NP

## 2020-03-21 DIAGNOSIS — H2513 Age-related nuclear cataract, bilateral: Secondary | ICD-10-CM | POA: Diagnosis not present

## 2020-03-21 DIAGNOSIS — H35363 Drusen (degenerative) of macula, bilateral: Secondary | ICD-10-CM | POA: Diagnosis not present

## 2020-03-21 DIAGNOSIS — H25013 Cortical age-related cataract, bilateral: Secondary | ICD-10-CM | POA: Diagnosis not present

## 2020-03-21 DIAGNOSIS — H524 Presbyopia: Secondary | ICD-10-CM | POA: Diagnosis not present

## 2020-04-03 DIAGNOSIS — N183 Chronic kidney disease, stage 3 unspecified: Secondary | ICD-10-CM | POA: Diagnosis not present

## 2020-04-03 DIAGNOSIS — D649 Anemia, unspecified: Secondary | ICD-10-CM | POA: Diagnosis not present

## 2020-04-03 DIAGNOSIS — R69 Illness, unspecified: Secondary | ICD-10-CM | POA: Diagnosis not present

## 2020-04-03 DIAGNOSIS — E782 Mixed hyperlipidemia: Secondary | ICD-10-CM | POA: Diagnosis not present

## 2020-04-03 DIAGNOSIS — N401 Enlarged prostate with lower urinary tract symptoms: Secondary | ICD-10-CM | POA: Diagnosis not present

## 2020-04-03 DIAGNOSIS — I10 Essential (primary) hypertension: Secondary | ICD-10-CM | POA: Diagnosis not present

## 2020-04-03 DIAGNOSIS — N400 Enlarged prostate without lower urinary tract symptoms: Secondary | ICD-10-CM | POA: Diagnosis not present

## 2020-04-03 DIAGNOSIS — N1330 Unspecified hydronephrosis: Secondary | ICD-10-CM | POA: Diagnosis not present

## 2020-04-03 DIAGNOSIS — I2510 Atherosclerotic heart disease of native coronary artery without angina pectoris: Secondary | ICD-10-CM | POA: Diagnosis not present

## 2020-04-07 ENCOUNTER — Other Ambulatory Visit: Payer: Self-pay

## 2020-04-07 ENCOUNTER — Ambulatory Visit (HOSPITAL_COMMUNITY): Payer: PPO | Attending: Cardiology

## 2020-04-07 DIAGNOSIS — I341 Nonrheumatic mitral (valve) prolapse: Secondary | ICD-10-CM | POA: Insufficient documentation

## 2020-04-07 DIAGNOSIS — I700 Atherosclerosis of aorta: Secondary | ICD-10-CM | POA: Diagnosis not present

## 2020-04-07 LAB — ECHOCARDIOGRAM COMPLETE
Area-P 1/2: 3.53 cm2
S' Lateral: 2.5 cm

## 2020-04-08 DIAGNOSIS — N401 Enlarged prostate with lower urinary tract symptoms: Secondary | ICD-10-CM | POA: Diagnosis not present

## 2020-04-15 DIAGNOSIS — N401 Enlarged prostate with lower urinary tract symptoms: Secondary | ICD-10-CM | POA: Diagnosis not present

## 2020-04-15 DIAGNOSIS — R3912 Poor urinary stream: Secondary | ICD-10-CM | POA: Diagnosis not present

## 2020-04-30 DIAGNOSIS — I10 Essential (primary) hypertension: Secondary | ICD-10-CM | POA: Diagnosis not present

## 2020-04-30 DIAGNOSIS — I2510 Atherosclerotic heart disease of native coronary artery without angina pectoris: Secondary | ICD-10-CM | POA: Diagnosis not present

## 2020-04-30 DIAGNOSIS — D649 Anemia, unspecified: Secondary | ICD-10-CM | POA: Diagnosis not present

## 2020-04-30 DIAGNOSIS — F322 Major depressive disorder, single episode, severe without psychotic features: Secondary | ICD-10-CM | POA: Diagnosis not present

## 2020-04-30 DIAGNOSIS — N1330 Unspecified hydronephrosis: Secondary | ICD-10-CM | POA: Diagnosis not present

## 2020-04-30 DIAGNOSIS — E782 Mixed hyperlipidemia: Secondary | ICD-10-CM | POA: Diagnosis not present

## 2020-04-30 DIAGNOSIS — E7800 Pure hypercholesterolemia, unspecified: Secondary | ICD-10-CM | POA: Diagnosis not present

## 2020-04-30 DIAGNOSIS — N183 Chronic kidney disease, stage 3 unspecified: Secondary | ICD-10-CM | POA: Diagnosis not present

## 2020-04-30 DIAGNOSIS — F329 Major depressive disorder, single episode, unspecified: Secondary | ICD-10-CM | POA: Diagnosis not present

## 2020-04-30 DIAGNOSIS — G459 Transient cerebral ischemic attack, unspecified: Secondary | ICD-10-CM | POA: Diagnosis not present

## 2020-04-30 DIAGNOSIS — N400 Enlarged prostate without lower urinary tract symptoms: Secondary | ICD-10-CM | POA: Diagnosis not present

## 2020-05-14 DIAGNOSIS — Z01812 Encounter for preprocedural laboratory examination: Secondary | ICD-10-CM | POA: Diagnosis not present

## 2020-05-19 DIAGNOSIS — Z1211 Encounter for screening for malignant neoplasm of colon: Secondary | ICD-10-CM | POA: Diagnosis not present

## 2020-06-16 DIAGNOSIS — I10 Essential (primary) hypertension: Secondary | ICD-10-CM | POA: Diagnosis not present

## 2020-06-16 DIAGNOSIS — N401 Enlarged prostate with lower urinary tract symptoms: Secondary | ICD-10-CM | POA: Diagnosis not present

## 2020-06-16 DIAGNOSIS — I2510 Atherosclerotic heart disease of native coronary artery without angina pectoris: Secondary | ICD-10-CM | POA: Diagnosis not present

## 2020-06-16 DIAGNOSIS — N400 Enlarged prostate without lower urinary tract symptoms: Secondary | ICD-10-CM | POA: Diagnosis not present

## 2020-06-16 DIAGNOSIS — F329 Major depressive disorder, single episode, unspecified: Secondary | ICD-10-CM | POA: Diagnosis not present

## 2020-06-16 DIAGNOSIS — N183 Chronic kidney disease, stage 3 unspecified: Secondary | ICD-10-CM | POA: Diagnosis not present

## 2020-06-16 DIAGNOSIS — N1330 Unspecified hydronephrosis: Secondary | ICD-10-CM | POA: Diagnosis not present

## 2020-06-16 DIAGNOSIS — E782 Mixed hyperlipidemia: Secondary | ICD-10-CM | POA: Diagnosis not present

## 2020-06-16 DIAGNOSIS — G459 Transient cerebral ischemic attack, unspecified: Secondary | ICD-10-CM | POA: Diagnosis not present

## 2020-06-16 DIAGNOSIS — D649 Anemia, unspecified: Secondary | ICD-10-CM | POA: Diagnosis not present

## 2020-06-16 DIAGNOSIS — E7800 Pure hypercholesterolemia, unspecified: Secondary | ICD-10-CM | POA: Diagnosis not present

## 2020-06-16 DIAGNOSIS — N182 Chronic kidney disease, stage 2 (mild): Secondary | ICD-10-CM | POA: Diagnosis not present

## 2020-07-23 DIAGNOSIS — N183 Chronic kidney disease, stage 3 unspecified: Secondary | ICD-10-CM | POA: Diagnosis not present

## 2020-07-23 DIAGNOSIS — I2510 Atherosclerotic heart disease of native coronary artery without angina pectoris: Secondary | ICD-10-CM | POA: Diagnosis not present

## 2020-07-23 DIAGNOSIS — I10 Essential (primary) hypertension: Secondary | ICD-10-CM | POA: Diagnosis not present

## 2020-07-23 DIAGNOSIS — N1330 Unspecified hydronephrosis: Secondary | ICD-10-CM | POA: Diagnosis not present

## 2020-07-23 DIAGNOSIS — D649 Anemia, unspecified: Secondary | ICD-10-CM | POA: Diagnosis not present

## 2020-07-23 DIAGNOSIS — E7800 Pure hypercholesterolemia, unspecified: Secondary | ICD-10-CM | POA: Diagnosis not present

## 2020-07-23 DIAGNOSIS — G459 Transient cerebral ischemic attack, unspecified: Secondary | ICD-10-CM | POA: Diagnosis not present

## 2020-07-23 DIAGNOSIS — F322 Major depressive disorder, single episode, severe without psychotic features: Secondary | ICD-10-CM | POA: Diagnosis not present

## 2020-07-23 DIAGNOSIS — E782 Mixed hyperlipidemia: Secondary | ICD-10-CM | POA: Diagnosis not present

## 2020-07-23 DIAGNOSIS — N401 Enlarged prostate with lower urinary tract symptoms: Secondary | ICD-10-CM | POA: Diagnosis not present

## 2020-08-09 DIAGNOSIS — Z20822 Contact with and (suspected) exposure to covid-19: Secondary | ICD-10-CM | POA: Diagnosis not present

## 2020-08-09 DIAGNOSIS — U071 COVID-19: Secondary | ICD-10-CM | POA: Diagnosis not present

## 2020-08-10 ENCOUNTER — Telehealth: Payer: Self-pay | Admitting: Oncology

## 2020-08-10 ENCOUNTER — Other Ambulatory Visit: Payer: Self-pay | Admitting: Oncology

## 2020-08-10 ENCOUNTER — Encounter: Payer: Self-pay | Admitting: Oncology

## 2020-08-10 DIAGNOSIS — U071 COVID-19: Secondary | ICD-10-CM

## 2020-08-10 MED ORDER — MOLNUPIRAVIR EUA 200MG CAPSULE: 4.0000 | ORAL_CAPSULE | Freq: Two times a day (BID) | ORAL | 0 refills | Status: AC

## 2020-08-10 NOTE — Progress Notes (Signed)
Mab orders placed.   Faythe Casa, NP 08/10/2020 12:32 PM

## 2020-08-10 NOTE — Telephone Encounter (Signed)
Outpatient Oral COVID Treatment Note  I connected with Lucas Hardy on 08/10/2020/12:17 PM by telephone and verified that I am speaking with the correct person using two identifiers.  I discussed the limitations, risks, security, and privacy concerns of performing an evaluation and management service by telephone and the availability of in person appointments. I also discussed with the patient that there may be a patient responsible charge related to this service. The patient expressed understanding and agreed to proceed.  Patient location: Home Provider location: Clinic   Diagnosis: COVID-19 infection  Purpose of visit: Discussion of potential use of Molnupiravir or Paxlovid, a new treatment for mild to moderate COVID-19 viral infection in non-hospitalized patients.   Subjective: Patient is a 75 y.o. male who has been diagnosed with COVID 19 viral infection.  Their symptoms began on 08/08/20.  Past Medical History:  Diagnosis Date  . Anxiety   . Aortic atherosclerosis ([place])   . Arthritis    degenerative of hand,  lumbar,  elbow  . Benign essential tremor   . Bladder outlet obstruction   . BPH without urinary obstruction   . CKD (chronic kidney disease), stage III ([place])   . Coronary artery disease    cardiologist-  dr Marlou Porch--  per cardiac cath 05-31-2017  mild nonobstructive cad w/ normal lvedp  . Depression   . Diastolic murmur   . ED (erectile dysfunction)   . Fatigue   . Foley catheter in place   . GERD (gastroesophageal reflux disease)   . Hemorrhoids   . History of exercise stress test 05-26-2017   dr Marlou Porch   abnormal test , ischemic changes;  cardiac cath done 05-31-2017  . History of kidney stones   . History of melanoma excision    left lower extremity in 2015 approx.-- early malignant   . History of pituitary tumor 1997   s/p  resection benign tumor  . History of pyelonephritis 07/29/2017   due to UTI  . Hydronephrosis, right   . Hypertension   . MVP (mitral valve  prolapse)    w/ grade 2 regurg.  per echo 03-09-2017  . Radiculopathy of lumbar region   . Restless legs syndrome (RLS)   . Solitary lung nodule    last CT 06-03-2016 stable, benign  . Vitamin D deficiency   . Wears glasses   . Wears hearing aid in both ears     Allergies  Allergen Reactions  . Effexor [Venlafaxine]     Anxiety   . Ciprofloxacin Nausea And Vomiting    Tolerated levoquin for 2 weeks as outpatient     Current Outpatient Medications:  .  acetaminophen (TYLENOL) 500 MG tablet, Take 1,000 mg by mouth daily as needed for fever., Disp: , Rfl:  .  aspirin 81 MG tablet, Take 1 tablet (81 mg total) by mouth daily. Resume once there is no blood in your urine for 48 hours, Disp: 30 tablet, Rfl: prn .  Cholecalciferol 2000 units CAPS, Take 2,000 Units by mouth daily., Disp: , Rfl:  .  FLUoxetine (PROZAC) 40 MG capsule, Take 40 mg by mouth every morning., Disp: , Rfl: 3 .  hydrochlorothiazide (HYDRODIURIL) 12.5 MG tablet, Take 12.5 mg by mouth daily., Disp: , Rfl:  .  Multiple Vitamin (MULTIVITAMIN WITH MINERALS) TABS tablet, Take 1 tablet by mouth daily., Disp: , Rfl:  .  rosuvastatin (CRESTOR) 20 MG tablet, Take 20 mg by mouth every morning. , Disp: , Rfl:  .  valsartan (DIOVAN) 80 MG  tablet, Take 80 mg by mouth 2 (two) times daily., Disp: , Rfl:   Objective: Patient sounds well.  They are in no apparent distress.  Breathing is non labored.  Mood and behavior are normal.  Laboratory Data:  No results found for this or any previous visit (from the past 2160 hour(s)).   Assessment: 75 y.o. male with mild/moderate COVID 19 viral infection diagnosed on 08/08/20 at high risk for progression to severe COVID 19.  Plan:  This patient is a 75 y.o. male that meets the following criteria for Emergency Use Authorization of: Molnupiravir  1. Age >18 yr 2. SARS-COV-2 positive test 3. Symptom onset < 5 days 4. Mild-to-moderate COVID disease with high risk for severe progression to  hospitalization or death   I have spoken and communicated the following to the patient or parent/caregiver regarding: 1. Molnupiravir is an unapproved drug that is authorized for use under an Print production planner.  2. There are no adequate, approved, available products for the treatment of COVID-19 in adults who have mild-to-moderate COVID-19 and are at high risk for progressing to severe COVID-19, including hospitalization or death. 3. Other therapeutics are currently authorized. For additional information on all products authorized for treatment or prevention of COVID-19, please see [email or web address].  4. There are benefits and risks of taking this treatment as outlined in the "Fact Sheet for Patients and Caregivers."  5. "Fact Sheet for Patients and Caregivers" was reviewed with patient. A hard copy will be provided to patient from pharmacy prior to the patient receiving treatment. 6. Patients should continue to self-isolate and use infection control measures (e.g., wear mask, isolate, social distance, avoid sharing personal items, clean and disinfect "high touch" surfaces, and frequent handwashing) according to CDC guidelines.  7. The patient or parent/caregiver has the option to accept or refuse treatment. 8. [place] has established a pregnancy surveillance program. 9. Females of childbearing potential should use a reliable method of contraception correctly and consistently, as applicable, for the duration of treatment and for 4 days after the last dose of Molnupiravir. 2. Males of reproductive potential who are sexually active with females of childbearing potential should use a reliable method of contraception correctly and consistently during treatment and for at least 3 months after the last dose. 11. Pregnancy status and risk was assessed. Patient verbalized  understanding of precautions.   After reviewing above information with the patient, the patient agrees to receive molnupiravir.  Follow up instructions:    . Take prescription BID x 5 days as directed . Reach out to pharmacist for counseling on medication if desired . For concerns regarding further COVID symptoms please follow up with your PCP or urgent care . For urgent or life-threatening issues, seek care at your local emergency department  The patient was provided an opportunity to ask questions, and all were answered. The patient agreed with the plan and demonstrated an understanding of the instructions.    The patient was advised to call their PCP or seek an in-person evaluation if the symptoms worsen or if the condition fails to improve as anticipated.   I provided 15 minutes of non face-to-face telephone visit time during this encounter, and > 50% was spent counseling as documented under my assessment & plan.  Jacquelin Hawking, NP 08/10/2020 /12:17 PM   I connected by phone with Lucas Hardy on 08/10/2020 at 12:24 PM to discuss the potential use of a new treatment for mild to moderate COVID-19 viral infection  in non-hospitalized patients.  This patient is a 75 y.o. male that meets the FDA criteria for Emergency Use Authorization of COVID monoclonal antibody bebtelovimab.  Has a (+) direct SARS-CoV-2 viral test result  Has mild or moderate COVID-19   Is NOT hospitalized due to COVID-19  Is within 10 days of symptom onset  Has at least one of the high risk factor(s) for progression to severe COVID-19 and/or hospitalization as defined in EUA.  Specific high risk criteria : Older age (>/= 74 yo) and Cardiovascular disease or hypertension   I have spoken and communicated the following to the patient or parent/caregiver regarding COVID monoclonal antibody treatment:  2. FDA has authorized the emergency use for the treatment of mild to moderate COVID-19 in adults and  pediatric patients with positive results of direct SARS-CoV-2 viral testing who are 36 years of age and older weighing at least 40 kg, and who are at high risk for progressing to severe COVID-19 and/or hospitalization.  3. The significant known and potential risks and benefits of COVID monoclonal antibody, and the extent to which such potential risks and benefits are unknown.  4. Information on available alternative treatments and the risks and benefits of those alternatives, including clinical trials.  5. Patients treated with COVID monoclonal antibody should continue to self-isolate and use infection control measures (e.g., wear mask, isolate, social distance, avoid sharing personal items, clean and disinfect "high touch" surfaces, and frequent handwashing) according to CDC guidelines.   6. The patient or parent/caregiver has the option to accept or refuse COVID monoclonal antibody treatment.  7. Discussion about the monoclonal antibody infusion does not ensure treatment. The patient will be placed on a list and scheduled according to risk, symptom onset and availability. A scheduler will reach to the patient to let them know if we can accommodate their infusion or not.  After reviewing this information with the patient, the patient has agreed to receive one of the available covid 19 monoclonal antibodies and will be provided an appropriate fact sheet prior to infusion. Jacquelin Hawking, NP 08/10/2020 12:24 PM

## 2020-08-13 ENCOUNTER — Telehealth: Payer: Self-pay

## 2020-08-13 ENCOUNTER — Ambulatory Visit (INDEPENDENT_AMBULATORY_CARE_PROVIDER_SITE_OTHER): Payer: PPO

## 2020-08-13 DIAGNOSIS — U071 COVID-19: Secondary | ICD-10-CM | POA: Diagnosis not present

## 2020-08-13 MED ORDER — ALBUTEROL SULFATE HFA 108 (90 BASE) MCG/ACT IN AERS: 2.0000 | INHALATION_SPRAY | Freq: Once | RESPIRATORY_TRACT | Status: AC | PRN

## 2020-08-13 MED ORDER — FAMOTIDINE IN NACL 20-0.9 MG/50ML-% IV SOLN: 20.0000 mg | Freq: Once | INTRAVENOUS | Status: AC | PRN

## 2020-08-13 MED ORDER — SODIUM CHLORIDE 0.9 % IV SOLN: INTRAVENOUS | Status: AC | PRN

## 2020-08-13 MED ORDER — EPINEPHRINE 0.3 MG/0.3ML IJ SOAJ: 0.3000 mg | Freq: Once | INTRAMUSCULAR | Status: AC | PRN

## 2020-08-13 MED ORDER — BEBTELOVIMAB 175 MG/2 ML IV (EUA)
175.0000 mg | Freq: Once | INTRAMUSCULAR | Status: AC
  Administered 2020-08-13: 175 mg via INTRAVENOUS

## 2020-08-13 MED ORDER — DIPHENHYDRAMINE HCL 50 MG/ML IJ SOLN: 50.0000 mg | Freq: Once | INTRAMUSCULAR | Status: AC | PRN

## 2020-08-13 MED ORDER — METHYLPREDNISOLONE SODIUM SUCC 125 MG IJ SOLR: 125.0000 mg | Freq: Once | INTRAMUSCULAR | Status: AC | PRN

## 2020-08-13 NOTE — Telephone Encounter (Signed)
Reviewed [facility] Estimate of $1050 with patient.  Stated they would like to proceed.   

## 2020-08-13 NOTE — Patient Instructions (Signed)
10 Things You Can Do to Manage Your COVID-19 Symptoms at Home If you have possible or confirmed COVID-19: 1. Stay home except to get medical care. 2. Monitor your symptoms carefully. If your symptoms get worse, call your healthcare provider immediately. 3. Get rest and stay hydrated. 4. If you have a medical appointment, call the healthcare provider ahead of time and tell them that you have or may have COVID-19. 5. For medical emergencies, call 911 and notify the dispatch personnel that you have or may have COVID-19. 6. Cover your cough and sneezes with a tissue or use the inside of your elbow. 7. Wash your hands often with soap and water for at least 20 seconds or clean your hands with an alcohol-based hand sanitizer that contains at least 60% alcohol. 8. As much as possible, stay in a specific room and away from other people in your home. Also, you should use a separate bathroom, if available. If you need to be around other people in or outside of the home, wear a mask. 9. Avoid sharing personal items with other people in your household, like dishes, towels, and bedding. 10. Clean all surfaces that are touched often, like counters, tabletops, and doorknobs. Use household cleaning sprays or wipes according to the label instructions. cdc.gov/coronavirus 10/19/2019 This information is not intended to replace advice given to you by your health care provider. Make sure you discuss any questions you have with your health care provider. Document Revised: 02/04/2020 Document Reviewed: 02/04/2020 Elsevier Patient Education  2021 Elsevier Inc.  What types of side effects do monoclonal antibody drugs cause?  Common side effects  In general, the more common side effects caused by monoclonal antibody drugs include: . Allergic reactions, such as hives or itching . Flu-like signs and symptoms, including chills, fatigue, fever, and muscle aches and pains . Nausea, vomiting . Diarrhea . Skin  rashes . Low blood pressure   The CDC is recommending patients who receive monoclonal antibody treatments wait at least 90 days before being vaccinated.  Currently, there are no data on the safety and efficacy of mRNA COVID-19 vaccines in persons who received monoclonal antibodies or convalescent plasma as part of COVID-19 treatment. Based on the estimated half-life of such therapies as well as evidence suggesting that reinfection is uncommon in the 90 days after initial infection, vaccination should be deferred for at least 90 days, as a precautionary measure until additional information becomes available, to avoid interference of the antibody treatment with vaccine-induced immune responses.   If someone you know is interested in receiving treatment please have them contact their MD for a referral or visit [email or web address]    

## 2020-08-13 NOTE — Progress Notes (Signed)
Diagnosis: COVID  Provider:  Marshell Garfinkel, MD  Procedure: Infusion  IV Type: Peripheral, IV Location: L Hand  Bebtelovimab, Dose: 175 mg  Infusion Start Time: 0938  Infusion Stop Time: 1829  Post Infusion IV Care: Observation period completed and Peripheral IV Discontinued  Discharge: Condition: Good, Destination: Home . AVS provided to patient.   Performed by:  Janine Ores, RN

## 2020-08-21 ENCOUNTER — Other Ambulatory Visit: Payer: Self-pay | Admitting: Physician Assistant

## 2020-08-21 ENCOUNTER — Ambulatory Visit
Admission: RE | Admit: 2020-08-21 | Discharge: 2020-08-21 | Disposition: A | Discharge status: Home / Self Care | Payer: PPO | Source: Ambulatory Visit | Attending: Physician Assistant | Admitting: Physician Assistant

## 2020-08-21 DIAGNOSIS — R059 Cough, unspecified: Secondary | ICD-10-CM | POA: Diagnosis not present

## 2020-08-29 DIAGNOSIS — G459 Transient cerebral ischemic attack, unspecified: Secondary | ICD-10-CM | POA: Diagnosis not present

## 2020-08-29 DIAGNOSIS — I10 Essential (primary) hypertension: Secondary | ICD-10-CM | POA: Diagnosis not present

## 2020-08-29 DIAGNOSIS — D649 Anemia, unspecified: Secondary | ICD-10-CM | POA: Diagnosis not present

## 2020-08-29 DIAGNOSIS — I2510 Atherosclerotic heart disease of native coronary artery without angina pectoris: Secondary | ICD-10-CM | POA: Diagnosis not present

## 2020-08-29 DIAGNOSIS — E7800 Pure hypercholesterolemia, unspecified: Secondary | ICD-10-CM | POA: Diagnosis not present

## 2020-08-29 DIAGNOSIS — N400 Enlarged prostate without lower urinary tract symptoms: Secondary | ICD-10-CM | POA: Diagnosis not present

## 2020-08-29 DIAGNOSIS — E782 Mixed hyperlipidemia: Secondary | ICD-10-CM | POA: Diagnosis not present

## 2020-08-29 DIAGNOSIS — N182 Chronic kidney disease, stage 2 (mild): Secondary | ICD-10-CM | POA: Diagnosis not present

## 2020-08-29 DIAGNOSIS — F330 Major depressive disorder, recurrent, mild: Secondary | ICD-10-CM | POA: Diagnosis not present

## 2020-09-16 NOTE — Progress Notes (Signed)
Diagnosis: COVID  Provider:  Marshell Garfinkel, MD  Procedure: Infusion  IV Type: Peripheral, IV Location: L Hand  Bebtelovimab, Dose: 175 mg  Infusion Start Time: 0938  Infusion Stop Time: 1829  Post Infusion IV Care: Observation period completed and Peripheral IV Discontinued  Discharge: Condition: Good, Destination: Home . AVS provided to patient.   Performed by:  Janine Ores, RN

## 2020-09-29 DIAGNOSIS — N1330 Unspecified hydronephrosis: Secondary | ICD-10-CM | POA: Diagnosis not present

## 2020-09-29 DIAGNOSIS — N400 Enlarged prostate without lower urinary tract symptoms: Secondary | ICD-10-CM | POA: Diagnosis not present

## 2020-09-29 DIAGNOSIS — D649 Anemia, unspecified: Secondary | ICD-10-CM | POA: Diagnosis not present

## 2020-09-29 DIAGNOSIS — F322 Major depressive disorder, single episode, severe without psychotic features: Secondary | ICD-10-CM | POA: Diagnosis not present

## 2020-09-29 DIAGNOSIS — E7800 Pure hypercholesterolemia, unspecified: Secondary | ICD-10-CM | POA: Diagnosis not present

## 2020-09-29 DIAGNOSIS — G459 Transient cerebral ischemic attack, unspecified: Secondary | ICD-10-CM | POA: Diagnosis not present

## 2020-09-29 DIAGNOSIS — N183 Chronic kidney disease, stage 3 unspecified: Secondary | ICD-10-CM | POA: Diagnosis not present

## 2020-09-29 DIAGNOSIS — E782 Mixed hyperlipidemia: Secondary | ICD-10-CM | POA: Diagnosis not present

## 2020-09-29 DIAGNOSIS — I2510 Atherosclerotic heart disease of native coronary artery without angina pectoris: Secondary | ICD-10-CM | POA: Diagnosis not present

## 2020-09-29 DIAGNOSIS — N401 Enlarged prostate with lower urinary tract symptoms: Secondary | ICD-10-CM | POA: Diagnosis not present

## 2020-09-29 DIAGNOSIS — I10 Essential (primary) hypertension: Secondary | ICD-10-CM | POA: Diagnosis not present

## 2020-11-03 DIAGNOSIS — I2510 Atherosclerotic heart disease of native coronary artery without angina pectoris: Secondary | ICD-10-CM | POA: Diagnosis not present

## 2020-11-03 DIAGNOSIS — I10 Essential (primary) hypertension: Secondary | ICD-10-CM | POA: Diagnosis not present

## 2020-11-03 DIAGNOSIS — F322 Major depressive disorder, single episode, severe without psychotic features: Secondary | ICD-10-CM | POA: Diagnosis not present

## 2020-11-03 DIAGNOSIS — G4700 Insomnia, unspecified: Secondary | ICD-10-CM | POA: Diagnosis not present

## 2020-11-03 DIAGNOSIS — N183 Chronic kidney disease, stage 3 unspecified: Secondary | ICD-10-CM | POA: Diagnosis not present

## 2020-11-03 DIAGNOSIS — E7800 Pure hypercholesterolemia, unspecified: Secondary | ICD-10-CM | POA: Diagnosis not present

## 2020-11-17 DIAGNOSIS — N183 Chronic kidney disease, stage 3 unspecified: Secondary | ICD-10-CM | POA: Diagnosis not present

## 2020-11-17 DIAGNOSIS — G4700 Insomnia, unspecified: Secondary | ICD-10-CM | POA: Diagnosis not present

## 2020-11-17 DIAGNOSIS — I2510 Atherosclerotic heart disease of native coronary artery without angina pectoris: Secondary | ICD-10-CM | POA: Diagnosis not present

## 2020-11-17 DIAGNOSIS — G459 Transient cerebral ischemic attack, unspecified: Secondary | ICD-10-CM | POA: Diagnosis not present

## 2020-11-17 DIAGNOSIS — F322 Major depressive disorder, single episode, severe without psychotic features: Secondary | ICD-10-CM | POA: Diagnosis not present

## 2020-11-17 DIAGNOSIS — N401 Enlarged prostate with lower urinary tract symptoms: Secondary | ICD-10-CM | POA: Diagnosis not present

## 2020-11-17 DIAGNOSIS — E7800 Pure hypercholesterolemia, unspecified: Secondary | ICD-10-CM | POA: Diagnosis not present

## 2020-11-17 DIAGNOSIS — N1330 Unspecified hydronephrosis: Secondary | ICD-10-CM | POA: Diagnosis not present

## 2020-11-17 DIAGNOSIS — D649 Anemia, unspecified: Secondary | ICD-10-CM | POA: Diagnosis not present

## 2020-11-17 DIAGNOSIS — N182 Chronic kidney disease, stage 2 (mild): Secondary | ICD-10-CM | POA: Diagnosis not present

## 2020-11-17 DIAGNOSIS — I10 Essential (primary) hypertension: Secondary | ICD-10-CM | POA: Diagnosis not present

## 2020-11-17 DIAGNOSIS — E782 Mixed hyperlipidemia: Secondary | ICD-10-CM | POA: Diagnosis not present

## 2020-12-30 DIAGNOSIS — G459 Transient cerebral ischemic attack, unspecified: Secondary | ICD-10-CM | POA: Diagnosis not present

## 2020-12-30 DIAGNOSIS — I2510 Atherosclerotic heart disease of native coronary artery without angina pectoris: Secondary | ICD-10-CM | POA: Diagnosis not present

## 2020-12-30 DIAGNOSIS — E782 Mixed hyperlipidemia: Secondary | ICD-10-CM | POA: Diagnosis not present

## 2020-12-30 DIAGNOSIS — G4700 Insomnia, unspecified: Secondary | ICD-10-CM | POA: Diagnosis not present

## 2020-12-30 DIAGNOSIS — I10 Essential (primary) hypertension: Secondary | ICD-10-CM | POA: Diagnosis not present

## 2020-12-30 DIAGNOSIS — N400 Enlarged prostate without lower urinary tract symptoms: Secondary | ICD-10-CM | POA: Diagnosis not present

## 2020-12-30 DIAGNOSIS — E7800 Pure hypercholesterolemia, unspecified: Secondary | ICD-10-CM | POA: Diagnosis not present

## 2020-12-30 DIAGNOSIS — N1330 Unspecified hydronephrosis: Secondary | ICD-10-CM | POA: Diagnosis not present

## 2020-12-30 DIAGNOSIS — D649 Anemia, unspecified: Secondary | ICD-10-CM | POA: Diagnosis not present

## 2020-12-30 DIAGNOSIS — N401 Enlarged prostate with lower urinary tract symptoms: Secondary | ICD-10-CM | POA: Diagnosis not present

## 2020-12-30 DIAGNOSIS — N183 Chronic kidney disease, stage 3 unspecified: Secondary | ICD-10-CM | POA: Diagnosis not present

## 2020-12-30 DIAGNOSIS — F322 Major depressive disorder, single episode, severe without psychotic features: Secondary | ICD-10-CM | POA: Diagnosis not present

## 2021-01-06 DIAGNOSIS — D649 Anemia, unspecified: Secondary | ICD-10-CM | POA: Diagnosis not present

## 2021-01-06 DIAGNOSIS — E7800 Pure hypercholesterolemia, unspecified: Secondary | ICD-10-CM | POA: Diagnosis not present

## 2021-01-06 DIAGNOSIS — I2510 Atherosclerotic heart disease of native coronary artery without angina pectoris: Secondary | ICD-10-CM | POA: Diagnosis not present

## 2021-01-06 DIAGNOSIS — F330 Major depressive disorder, recurrent, mild: Secondary | ICD-10-CM | POA: Diagnosis not present

## 2021-01-06 DIAGNOSIS — N183 Chronic kidney disease, stage 3 unspecified: Secondary | ICD-10-CM | POA: Diagnosis not present

## 2021-01-06 DIAGNOSIS — G4700 Insomnia, unspecified: Secondary | ICD-10-CM | POA: Diagnosis not present

## 2021-01-06 DIAGNOSIS — I10 Essential (primary) hypertension: Secondary | ICD-10-CM | POA: Diagnosis not present

## 2021-01-06 DIAGNOSIS — N401 Enlarged prostate with lower urinary tract symptoms: Secondary | ICD-10-CM | POA: Diagnosis not present

## 2021-01-06 DIAGNOSIS — Z79899 Other long term (current) drug therapy: Secondary | ICD-10-CM | POA: Diagnosis not present

## 2021-01-06 DIAGNOSIS — E559 Vitamin D deficiency, unspecified: Secondary | ICD-10-CM | POA: Diagnosis not present

## 2021-01-06 DIAGNOSIS — Z0001 Encounter for general adult medical examination with abnormal findings: Secondary | ICD-10-CM | POA: Diagnosis not present

## 2021-01-06 DIAGNOSIS — K4090 Unilateral inguinal hernia, without obstruction or gangrene, not specified as recurrent: Secondary | ICD-10-CM | POA: Diagnosis not present

## 2021-01-21 DIAGNOSIS — I10 Essential (primary) hypertension: Secondary | ICD-10-CM | POA: Diagnosis not present

## 2021-02-09 DIAGNOSIS — J069 Acute upper respiratory infection, unspecified: Secondary | ICD-10-CM | POA: Diagnosis not present

## 2021-02-10 DIAGNOSIS — R509 Fever, unspecified: Secondary | ICD-10-CM | POA: Diagnosis not present

## 2021-02-10 DIAGNOSIS — Z20828 Contact with and (suspected) exposure to other viral communicable diseases: Secondary | ICD-10-CM | POA: Diagnosis not present

## 2021-02-10 DIAGNOSIS — B338 Other specified viral diseases: Secondary | ICD-10-CM | POA: Diagnosis not present

## 2021-02-17 DIAGNOSIS — D1801 Hemangioma of skin and subcutaneous tissue: Secondary | ICD-10-CM | POA: Diagnosis not present

## 2021-02-17 DIAGNOSIS — L570 Actinic keratosis: Secondary | ICD-10-CM | POA: Diagnosis not present

## 2021-02-17 DIAGNOSIS — L568 Other specified acute skin changes due to ultraviolet radiation: Secondary | ICD-10-CM | POA: Diagnosis not present

## 2021-02-17 DIAGNOSIS — L821 Other seborrheic keratosis: Secondary | ICD-10-CM | POA: Diagnosis not present

## 2021-02-17 DIAGNOSIS — L819 Disorder of pigmentation, unspecified: Secondary | ICD-10-CM | POA: Diagnosis not present

## 2021-02-17 DIAGNOSIS — L814 Other melanin hyperpigmentation: Secondary | ICD-10-CM | POA: Diagnosis not present

## 2021-03-04 DIAGNOSIS — E7800 Pure hypercholesterolemia, unspecified: Secondary | ICD-10-CM | POA: Diagnosis not present

## 2021-03-04 DIAGNOSIS — I2510 Atherosclerotic heart disease of native coronary artery without angina pectoris: Secondary | ICD-10-CM | POA: Diagnosis not present

## 2021-03-04 DIAGNOSIS — N401 Enlarged prostate with lower urinary tract symptoms: Secondary | ICD-10-CM | POA: Diagnosis not present

## 2021-03-04 DIAGNOSIS — G459 Transient cerebral ischemic attack, unspecified: Secondary | ICD-10-CM | POA: Diagnosis not present

## 2021-03-04 DIAGNOSIS — E782 Mixed hyperlipidemia: Secondary | ICD-10-CM | POA: Diagnosis not present

## 2021-03-04 DIAGNOSIS — F330 Major depressive disorder, recurrent, mild: Secondary | ICD-10-CM | POA: Diagnosis not present

## 2021-03-04 DIAGNOSIS — I10 Essential (primary) hypertension: Secondary | ICD-10-CM | POA: Diagnosis not present

## 2021-03-04 DIAGNOSIS — N1330 Unspecified hydronephrosis: Secondary | ICD-10-CM | POA: Diagnosis not present

## 2021-03-04 DIAGNOSIS — D649 Anemia, unspecified: Secondary | ICD-10-CM | POA: Diagnosis not present

## 2021-03-04 DIAGNOSIS — G4700 Insomnia, unspecified: Secondary | ICD-10-CM | POA: Diagnosis not present

## 2021-03-04 DIAGNOSIS — N183 Chronic kidney disease, stage 3 unspecified: Secondary | ICD-10-CM | POA: Diagnosis not present

## 2021-03-23 DIAGNOSIS — H524 Presbyopia: Secondary | ICD-10-CM | POA: Diagnosis not present

## 2021-03-23 DIAGNOSIS — H35363 Drusen (degenerative) of macula, bilateral: Secondary | ICD-10-CM | POA: Diagnosis not present

## 2021-03-23 DIAGNOSIS — H2513 Age-related nuclear cataract, bilateral: Secondary | ICD-10-CM | POA: Diagnosis not present

## 2021-03-23 DIAGNOSIS — H25013 Cortical age-related cataract, bilateral: Secondary | ICD-10-CM | POA: Diagnosis not present

## 2021-04-07 DIAGNOSIS — N401 Enlarged prostate with lower urinary tract symptoms: Secondary | ICD-10-CM | POA: Diagnosis not present

## 2021-04-14 DIAGNOSIS — N5201 Erectile dysfunction due to arterial insufficiency: Secondary | ICD-10-CM | POA: Diagnosis not present

## 2021-04-14 DIAGNOSIS — R3912 Poor urinary stream: Secondary | ICD-10-CM | POA: Diagnosis not present

## 2021-04-14 DIAGNOSIS — N401 Enlarged prostate with lower urinary tract symptoms: Secondary | ICD-10-CM | POA: Diagnosis not present

## 2021-06-26 DIAGNOSIS — I10 Essential (primary) hypertension: Secondary | ICD-10-CM | POA: Diagnosis not present

## 2021-06-26 DIAGNOSIS — E782 Mixed hyperlipidemia: Secondary | ICD-10-CM | POA: Diagnosis not present

## 2021-07-08 DIAGNOSIS — Z79899 Other long term (current) drug therapy: Secondary | ICD-10-CM | POA: Diagnosis not present

## 2021-07-08 DIAGNOSIS — E559 Vitamin D deficiency, unspecified: Secondary | ICD-10-CM | POA: Diagnosis not present

## 2021-07-08 DIAGNOSIS — F330 Major depressive disorder, recurrent, mild: Secondary | ICD-10-CM | POA: Diagnosis not present

## 2021-07-08 DIAGNOSIS — D649 Anemia, unspecified: Secondary | ICD-10-CM | POA: Diagnosis not present

## 2021-07-08 DIAGNOSIS — I10 Essential (primary) hypertension: Secondary | ICD-10-CM | POA: Diagnosis not present

## 2021-07-08 DIAGNOSIS — I2510 Atherosclerotic heart disease of native coronary artery without angina pectoris: Secondary | ICD-10-CM | POA: Diagnosis not present

## 2021-07-08 DIAGNOSIS — K4090 Unilateral inguinal hernia, without obstruction or gangrene, not specified as recurrent: Secondary | ICD-10-CM | POA: Diagnosis not present

## 2021-07-08 DIAGNOSIS — E7800 Pure hypercholesterolemia, unspecified: Secondary | ICD-10-CM | POA: Diagnosis not present

## 2021-07-08 DIAGNOSIS — N401 Enlarged prostate with lower urinary tract symptoms: Secondary | ICD-10-CM | POA: Diagnosis not present

## 2021-07-08 DIAGNOSIS — G4700 Insomnia, unspecified: Secondary | ICD-10-CM | POA: Diagnosis not present

## 2021-07-08 DIAGNOSIS — B009 Herpesviral infection, unspecified: Secondary | ICD-10-CM | POA: Diagnosis not present

## 2021-07-08 DIAGNOSIS — N183 Chronic kidney disease, stage 3 unspecified: Secondary | ICD-10-CM | POA: Diagnosis not present

## 2021-07-09 ENCOUNTER — Encounter: Payer: Self-pay | Admitting: Cardiology

## 2021-07-09 ENCOUNTER — Ambulatory Visit (INDEPENDENT_AMBULATORY_CARE_PROVIDER_SITE_OTHER): Payer: PPO | Admitting: Cardiology

## 2021-07-09 VITALS — BP 102/58 | HR 54 | Ht 70.5 in | Wt 180.8 lb

## 2021-07-09 DIAGNOSIS — I77810 Thoracic aortic ectasia: Secondary | ICD-10-CM

## 2021-07-09 DIAGNOSIS — I2510 Atherosclerotic heart disease of native coronary artery without angina pectoris: Secondary | ICD-10-CM | POA: Diagnosis not present

## 2021-07-09 DIAGNOSIS — I959 Hypotension, unspecified: Secondary | ICD-10-CM | POA: Insufficient documentation

## 2021-07-09 DIAGNOSIS — I950 Idiopathic hypotension: Secondary | ICD-10-CM | POA: Diagnosis not present

## 2021-07-09 NOTE — Progress Notes (Signed)
?Cardiology Office Note:   ? ?Date:  07/09/2021  ? ?Lucas Hardy, DOB Aug 14, 1945, MRN [id number] ? ?PCP:  Josetta Huddle, MD  ?[place] Cardiologist:  Candee Furbish, MD  ?[facility] Electrophysiologist:  None  ? ?Referring MD: Josetta Huddle, MD  ? ? ?History of Present Illness:   ? ?Lucas Hardy is a 76 y.o. male here for the follow-up of hypotension. ? ?At prior visit his blood pressure was 90/60 on 01/16/2020.  He had an episode of syncope after reviewing ER notes from April 2021.  He notes that his blood pressure after walking on a treadmill for 3 miles at the gym can be in the 90s for a while after exercise.  I discontinued his amlodipine 5 mg and continued with the HCTZ 12.5 in AM valsartan 80 mg at night. ? ?He also has coronary plaque noted on CT scan.  Prior cardiac catheterization performed on 05/31/2017 showed no high risk lesions.  Nonobstructive disease ? ?Today: ?He is accompanied by his wife [employer], who is also a patient of mine. They both have appointments scheduled today and wish to be seen together. ? ?Overall, he is feeling well. ? ?He was recently restarted on Amlodipine. ? ?He is experiencing labile bp when he takes walks and when he is not active. He walks up to 4 miles, 6 days out of the week. In the mornings his bp is typically higher.  ? 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 ? ?Past Medical History:  ?Diagnosis Date  ? Anxiety   ? Aortic atherosclerosis ([place])   ? Arthritis   ? degenerative of hand,  lumbar,  elbow  ? Benign essential tremor   ? Bladder outlet obstruction   ? BPH without urinary obstruction   ? CKD (chronic kidney disease), stage III ([place])   ? Coronary artery disease   ? cardiologist-  dr Marlou Porch--  per cardiac cath 05-31-2017  mild nonobstructive cad w/ normal lvedp  ? Depression   ? Diastolic murmur   ? ED (erectile dysfunction)   ? Fatigue   ? Foley catheter in place   ? GERD (gastroesophageal reflux disease)   ?  Hemorrhoids   ? History of exercise stress test 05-26-2017   dr Marlou Porch  ? abnormal test , ischemic changes;  cardiac cath done 05-31-2017  ? History of kidney stones   ? History of melanoma excision   ? left lower extremity in 2015 approx.-- early malignant   ? History of pituitary tumor 1997  ? s/p  resection benign tumor  ? History of pyelonephritis 07/29/2017  ? due to UTI  ? Hydronephrosis, right   ? Hypertension   ? MVP (mitral valve prolapse)   ? w/ grade 2 regurg.  per echo 03-09-2017  ? Radiculopathy of lumbar region   ? Restless legs syndrome (RLS)   ? Solitary lung nodule   ? last CT 06-03-2016 stable, benign  ? Vitamin D deficiency   ? Wears glasses   ? Wears hearing aid in both ears   ? ? ?Past Surgical History:  ?Procedure Laterality Date  ? CYSTOSCOPY WITH STENT PLACEMENT Right 06/22/2017  ? Procedure: CYSTOSCOPY WITH RIGHT URETERAL STENT PLACEMENT;  Surgeon: Raynelle Bring, MD;  Location: [facility];  Service: Urology;  Laterality: Right;  ? CYSTOSCOPY/URETEROSCOPY/HOLMIUM LASER/STENT PLACEMENT Right 08/18/2017  ? Procedure: CYSTOSCOPIY/RIGHT RETROGRADE PYLOGRAM/RIGHT URETEROSCOPY;  Surgeon: Ardis Hughs, MD;  Location: [facility];  Service:  Urology;  Laterality: Right;  ? FINGER SURGERY Left ?  ? reattach left index finger amputation  ? INGUINAL HERNIA REPAIR Left 1998  approx.  ? LEFT HEART CATH AND CORONARY ANGIOGRAPHY N/A 05/31/2017  ? Procedure: LEFT HEART CATH AND CORONARY ANGIOGRAPHY;  Surgeon: Martinique, Peter M, MD;  Location: [facility];  Service: Cardiovascular;  Laterality: N/A;  abnormal ETT;  minor nonobstructive CAD (dRCA 20%, post astrio 30%), low LVEDP  ? TONSILLECTOMY  ~ 1963  ? TRANSPHENOIDAL PITUITARY RESECTION  1997  ? TRANSTHORACIC ECHOCARDIOGRAM  03/10/2017  ? mild concentric LVH, ef 47-42%, grade 1 diastolic dysfunction/ mild dilated left artrial cavity/ mild myxomatous degeneration and bileaflet prolapse with moderate (grade 1) MR/  mild TR/  trace AR and  PR  ? TRANSURETHRAL RESECTION OF PROSTATE N/A 08/18/2017  ? Procedure: TRANSURETHRAL RESECTION OF THE PROSTATE (TURP);  Surgeon: Ardis Hughs, MD;  Location: [facility];  Service: Urology;  Laterality: N/A;  ? ? ?Current Medications: ?Current Meds  ?Medication Sig  ? acetaminophen (TYLENOL) 500 MG tablet Take 1,000 mg by mouth daily as needed for fever.  ? amLODipine (NORVASC) 5 MG tablet Take 5 mg by mouth every morning.  ? aspirin 81 MG tablet Take 1 tablet (81 mg total) by mouth daily. Resume once there is no blood in your urine for 48 hours  ? Cholecalciferol 2000 units CAPS Take 2,000 Units by mouth daily.  ? FLUoxetine (PROZAC) 40 MG capsule Take 40 mg by mouth every morning.  ? Multiple Vitamin (MULTIVITAMIN WITH MINERALS) TABS tablet Take 1 tablet by mouth daily.  ? rosuvastatin (CRESTOR) 20 MG tablet Take 20 mg by mouth every morning.   ? valsartan (DIOVAN) 80 MG tablet Take 80 mg by mouth 2 (two) times daily.  ? [DISCONTINUED] hydrochlorothiazide (HYDRODIURIL) 12.5 MG tablet Take 12.5 mg by mouth daily.  ? ?Current Facility-Administered Medications for the 07/09/21 encounter (Office Visit) with Jerline Pain, MD  ?Medication  ? 0.9 %  sodium chloride infusion  ?  ? ?Allergies:   Atorvastatin, Effexor [venlafaxine], and Ciprofloxacin  ? ?Social History  ? ?Socioeconomic History  ? Marital status: Married  ?  Spouse name: Not on file  ? Number of children: Not on file  ? Years of education: Not on file  ? Highest education level: Not on file  ?Occupational History  ? Not on file  ?Tobacco Use  ? Smoking status: Former  ?  Packs/day: 1.00  ?  Years: 15.00  ?  Pack years: 15.00  ?  Types: Cigarettes  ?  Quit date: 04/11/1978  ?  Years since quitting: 43.2  ? Smokeless tobacco: Former  ?  Types: Chew  ?  Quit date: 08/13/1998  ?Vaping Use  ? Vaping Use: Never used  ?Substance and Sexual Activity  ? Alcohol use: Yes  ?  Comment: rare  ? Drug use: No  ? Sexual activity: Not Currently  ?Other  Topics Concern  ? Not on file  ?Social History Narrative  ? Not on file  ? ?Social Determinants of Health  ? ?Financial Resource Strain: Not on file  ?Food Insecurity: Not on file  ?Transportation Needs: Not on file  ?Physical Activity: Not on file  ?Stress: Not on file  ?Social Connections: Not on file  ?  ? ?Family History: ?The patient's family history includes Hypertension in his father. ? ?ROS:   ?Please see the history of present illness.   ?(+) Labile Blood Pressure ?(+)  Remote syncope ?All other systems reviewed and are negative. ? ?EKGs/Labs/Other Studies Reviewed:   ? ?The following studies were reviewed today: ?Echo 04/07/2020: ?IMPRESSIONS  ? ? ? 1. Left ventricular ejection fraction, by estimation, is 60 to 65%. The  ?left ventricle has normal function. The left ventricle has no regional  ?wall motion abnormalities. There is mild left ventricular hypertrophy.  ?Left ventricular diastolic parameters  ?are consistent with Grade I diastolic dysfunction (impaired relaxation).  ? 2. Right ventricular systolic function is normal. The right ventricular  ?size is normal.  ? 3. Left atrial size was mildly dilated.  ? 4. The mitral valve is normal in structure. Mild mitral valve  ?regurgitation. No evidence of mitral stenosis.  ? 5. The aortic valve is normal in structure. Aortic valve regurgitation is  ?not visualized. No aortic stenosis is present.  ? 6. Pulmonic valve regurgitation is moderate.  ? 7. The inferior vena cava is normal in size with greater than 50%  ?respiratory variability, suggesting right atrial pressure of 3 mmHg.  ? ?Comparison(s): Aortic root prior measure 40 mm, now normal. ?ECHO 05/29/2018 ? ? 1. The left ventricle has normal systolic function with an ejection  ?fraction of 60-65%. The cavity size was normal. Left ventricular diastolic  ?Doppler parameters are consistent with impaired relaxation.  ? 2. The mitral valve is normal in structure. Mild thickening of the mitral  ?valve leaflet.  ?  3. The tricuspid valve is normal in structure.  ? 4. The aortic valve is tricuspid Mild thickening of the aortic valve.  ? 5. The pulmonic valve was normal in structure. Pulmonic valve  ?regurgitation

## 2021-07-09 NOTE — Assessment & Plan Note (Signed)
40 mm previously.  Echocardiogram on repeat showed less than 4 cm. ?

## 2021-07-09 NOTE — Assessment & Plan Note (Signed)
Overall seems to doing quite well.  I had discontinued his amlodipine at 1 point but this was reinstated shortly thereafter by Dr. Inda Merlin after he had quite significantly elevated blood pressure.  Today he is fairly low at around 993 systolic.  He would like to try to come off of the medication.  Lets go ahead and try the HCTZ 12.5 mg stopping it. ?

## 2021-07-09 NOTE — Assessment & Plan Note (Signed)
Continue with Crestor aspirin secondary risk factor prevention.  Prior cardiac catheterization in 2019 as above. ?

## 2021-07-09 NOTE — Patient Instructions (Signed)
Medication Instructions:  ?Please discontinue your Hydrochlorothiazide. ?Continue all other medications as listed. ? ?*If you need a refill on your cardiac medications before your next appointment, please call your pharmacy* ? ?Follow-Up: ?At [facility], you and your health needs are our priority.  As part of our continuing mission to provide you with exceptional heart care, we have created designated Provider Care Teams.  These Care Teams include your primary Cardiologist (physician) and Advanced Practice Providers (APPs -  Physician Assistants and Nurse Practitioners) who all work together to provide you with the care you need, when you need it. ? ?We recommend signing up for the patient portal called "MyChart".  Sign up information is provided on this After Visit Summary.  MyChart is used to connect with patients for Virtual Visits (Telemedicine).  Patients are able to view lab/test results, encounter notes, upcoming appointments, etc.  Non-urgent messages can be sent to your provider as well.   ?To learn more about what you can do with MyChart, go to [email or web address].   ? ?Your next appointment:   ?6 month(s) ? ?The format for your next appointment:   ?In Person ? ?Provider:   ?Robbie Lis, PA-C, Nicholes Rough, PA-C, Dayna Dunn, PA-C, Ermalinda Barrios, PA-C, Christen Bame, NP, or Richardson Dopp, PA-C       ? ? ?Thank you for choosing [facility]!! ? ? ? ?

## 2021-07-28 ENCOUNTER — Encounter (HOSPITAL_BASED_OUTPATIENT_CLINIC_OR_DEPARTMENT_OTHER): Payer: Self-pay

## 2021-07-28 ENCOUNTER — Emergency Department (HOSPITAL_BASED_OUTPATIENT_CLINIC_OR_DEPARTMENT_OTHER): Payer: PPO

## 2021-07-28 ENCOUNTER — Emergency Department (HOSPITAL_BASED_OUTPATIENT_CLINIC_OR_DEPARTMENT_OTHER)
Admission: EM | Admit: 2021-07-28 | Discharge: 2021-07-28 | Disposition: A | Discharge status: Home / Self Care | Payer: PPO | Attending: Emergency Medicine | Admitting: Emergency Medicine

## 2021-07-28 ENCOUNTER — Emergency Department (HOSPITAL_BASED_OUTPATIENT_CLINIC_OR_DEPARTMENT_OTHER): Payer: PPO | Admitting: Radiology

## 2021-07-28 ENCOUNTER — Other Ambulatory Visit: Payer: Self-pay

## 2021-07-28 DIAGNOSIS — W19XXXA Unspecified fall, initial encounter: Secondary | ICD-10-CM

## 2021-07-28 DIAGNOSIS — M549 Dorsalgia, unspecified: Secondary | ICD-10-CM | POA: Diagnosis not present

## 2021-07-28 DIAGNOSIS — W11XXXA Fall on and from ladder, initial encounter: Secondary | ICD-10-CM | POA: Insufficient documentation

## 2021-07-28 DIAGNOSIS — M79642 Pain in left hand: Secondary | ICD-10-CM | POA: Insufficient documentation

## 2021-07-28 DIAGNOSIS — Z79899 Other long term (current) drug therapy: Secondary | ICD-10-CM | POA: Diagnosis not present

## 2021-07-28 DIAGNOSIS — M25532 Pain in left wrist: Secondary | ICD-10-CM | POA: Insufficient documentation

## 2021-07-28 DIAGNOSIS — M79632 Pain in left forearm: Secondary | ICD-10-CM | POA: Diagnosis not present

## 2021-07-28 DIAGNOSIS — Z043 Encounter for examination and observation following other accident: Secondary | ICD-10-CM | POA: Diagnosis not present

## 2021-07-28 DIAGNOSIS — Z7982 Long term (current) use of aspirin: Secondary | ICD-10-CM | POA: Diagnosis not present

## 2021-07-28 MED ORDER — OXYCODONE-ACETAMINOPHEN 5-325 MG PO TABS: 1.0000 | ORAL_TABLET | Freq: Four times a day (QID) | ORAL | 0 refills | Status: DC | PRN

## 2021-07-28 MED ORDER — HYDROCODONE-ACETAMINOPHEN 5-325 MG PO TABS
1.0000 | ORAL_TABLET | Freq: Once | ORAL | Status: AC
  Administered 2021-07-28: 1 via ORAL
  Filled 2021-07-28: qty 1

## 2021-07-28 NOTE — ED Notes (Signed)
Patient transported to X-ray 

## 2021-07-28 NOTE — Discharge Instructions (Addendum)
Your x-ray did not show any evidence of broken bones on your hands or wrists.  Given the location of your pain at your wrist I have placed you in a splint.  I recommend repeat imaging in 3 weeks to ensure there is no hidden fracture.  May take Tylenol or Motrin as needed for pain. ? ?Follow up with PCP ? ?Return for new or worsening symptoms ?

## 2021-07-28 NOTE — ED Provider Notes (Signed)
?[facility] EMERGENCY DEPT ?Provider Note ? ? ?CSN: [id number] ?Arrival date & time: 07/28/21  1636 ? ?  ?History ? ?Chief Complaint  ?Patient presents with  ? Wrist Pain  ? ? ?Lucas Hardy is a 76 y.o. male here for evaluation after mechanical fall.   1 hour PTA step-off a ladder wrong.  Reached back with his left hand landing on outstretched arm.  He has pain to his left hand, wrist and forearm.  No numbness or weakness.  Denies hitting his head, LOC or anticoagulation.  Fell onto his bottom.  Ambulatory PTA.  He drove himself here.  Rates his pain a 9/10.  No HA, CP, ABD pain, Back pain, LE pain. ? ? ?HPI ? ?  ? ?Home Medications ?Prior to Admission medications   ?Medication Sig Start Date End Date Taking? Authorizing Provider  ?oxyCODONE-acetaminophen (PERCOCET/ROXICET) 5-325 MG tablet Take 1 tablet by mouth every 6 (six) hours as needed for severe pain. 07/28/21  Yes Vitaliy Eisenhour A, PA-C  ?acetaminophen (TYLENOL) 500 MG tablet Take 1,000 mg by mouth daily as needed for fever.    [provider]  ?amLODipine (NORVASC) 5 MG tablet Take 5 mg by mouth every morning. 06/26/21   [provider]  ?aspirin 81 MG tablet Take 1 tablet (81 mg total) by mouth daily. Resume once there is no blood in your urine for 48 hours 08/18/17   Ardis Hughs, MD  ?Cholecalciferol 2000 units CAPS Take 2,000 Units by mouth daily.    [provider]  ?FLUoxetine (PROZAC) 40 MG capsule Take 40 mg by mouth every morning. 12/07/17   [provider]  ?Multiple Vitamin (MULTIVITAMIN WITH MINERALS) TABS tablet Take 1 tablet by mouth daily.    [provider]  ?rosuvastatin (CRESTOR) 20 MG tablet Take 20 mg by mouth every morning.     [provider]  ?traZODone (DESYREL) 50 MG tablet Take 25-50 mg by mouth at bedtime. 03/04/21   [provider]  ?valsartan (DIOVAN) 80 MG tablet Take 80 mg by mouth 2 (two) times daily. 01/10/20   [provider]  ?    ? ?Allergies    ?Atorvastatin, Effexor [venlafaxine], and Ciprofloxacin   ? ?Review of Systems   ?Review of Systems  ?Constitutional: Negative.   ?HENT: Negative.    ?Respiratory: Negative.    ?Cardiovascular: Negative.   ?Gastrointestinal: Negative.   ?Genitourinary: Negative.   ?Musculoskeletal:  Negative for back pain, gait problem, myalgias, neck pain and neck stiffness.  ?     Left hand, wrist, forearm pain  ?Skin: Negative.   ?Neurological: Negative.   ?All other systems reviewed and are negative. ? ?Physical Exam ?Updated Vital Signs ?BP (!) 155/86 (BP Location: Right Arm)   Pulse 61   Temp 97.9 ?F (36.6 ?C) (Temporal)   Resp 16   Ht 5' 10.5" (1.791 m)   Wt 82 kg   SpO2 97%   BMI 25.57 kg/m?  ?Physical Exam ?Vitals and nursing note reviewed.  ?Constitutional:   ?   General: He is not in acute distress. ?   Appearance: He is well-developed. He is not ill-appearing, toxic-appearing or diaphoretic.  ?HENT:  ?   Head: Normocephalic and atraumatic.  ?   Nose: Nose normal.  ?   Mouth/Throat:  ?   Mouth: Mucous membranes are moist.  ?Eyes:  ?   Pupils: Pupils are equal, round, and reactive to light.  ?Cardiovascular:  ?   Rate and Rhythm: Normal rate  and regular rhythm.  ?   Pulses: Normal pulses.     ?     Radial pulses are 2+ on the right side and 2+ on the left side.  ?   Heart sounds: Normal heart sounds.  ?Pulmonary:  ?   Effort: Pulmonary effort is normal. No respiratory distress.  ?   Breath sounds: Normal breath sounds.  ?Abdominal:  ?   General: Bowel sounds are normal. There is no distension.  ?   Palpations: Abdomen is soft.  ?Musculoskeletal:     ?   General: Swelling, tenderness and signs of injury present. No deformity. Normal range of motion.  ?   Cervical back: Normal range of motion and neck supple.  ?   Right lower leg: No edema.  ?   Left lower leg: No edema.  ?   Comments: Nontender right upper extremity, full range of motion ?Nontender left shoulder, humerus and elbow.  Able to flex and  extend.  Diffuse tenderness to midshaft forearm, wrist proximal hand.  Able to grip hand bilaterally ?No midline C/T/L tenderness.  Pelvis stable, nontender palpation.  Nontender bilateral lower extremities.  Full range of motion.  No shortening rotation of legs. ? ?Tenderness left scaphoid  ?Skin: ?   General: Skin is warm and dry.  ?   Comments: No edema, erythema or warmth  ?Neurological:  ?   General: No focal deficit present.  ?   Mental Status: He is alert and oriented to person, place, and time.  ?   Comments: Intact sensation ?Ambulatory ?Equal hand grip  ? ? ?ED Results / Procedures / Treatments   ?Labs ?(all labs ordered are listed, but only abnormal results are displayed) ?Labs Reviewed - No data to display ? ?EKG ?None ? ?Radiology ?DG Lumbar Spine Complete ? ?Result Date: 07/28/2021 ?CLINICAL DATA:  Fall, back pain EXAM: LUMBAR SPINE - COMPLETE 4+ VIEW COMPARISON:  CT examination dated June 22, 2017 FINDINGS: There is wedge-shaped compression deformity of the T12 vertebral body of indeterminate age with approximately 40% anterior vertebral body height loss. There is mild multilevel degenerate disc disease with associated facet joint arthropathy prominent at L4-L5 and L5-S1. IMPRESSION: 1. Compression deformity of T12 vertebral body of indeterminate age, new since prior CT examination of June 22, 2017. Correlate with localized pain, MRI examination could be considered for further evaluation if clinically warranted. 2. Mild multilevel DA and disc disease prominent at L4-L5 and L5-S1 with associated facet joint arthropathy. Electronically Signed   By: Keane Police D.O.   On: 07/28/2021 17:46  ? ?DG Pelvis 1-2 Views ? ?Result Date: 07/28/2021 ?CLINICAL DATA:  Fall EXAM: PELVIS - 1-2 VIEW COMPARISON:  CT examination dated June 22, 2017 FINDINGS: There is no evidence of pelvic fracture or diastasis. There is a lucent lesion in the right intertrochanteric region with sclerotic margins, unchanged from prior CT  examination and most consistent with LSMFT, a benign process. IMPRESSION: No evidence of acute fracture or dislocation. Electronically Signed   By: Keane Police D.O.   On: 07/28/2021 17:29  ? ?DG Forearm Left ? ?Result Date: 07/28/2021 ?CLINICAL DATA:  Fall EXAM: LEFT FOREARM - 2 VIEW COMPARISON:  None. FINDINGS: There is no evidence of fracture or other focal bone lesions. Soft tissues are unremarkable. IMPRESSION: Negative. Electronically Signed   By: Keane Police D.O.   On: 07/28/2021 17:50  ? ?CT Thoracic Spine Wo Contrast ? ?Result Date: 07/28/2021 ?CLINICAL DATA:  Follow-up T12 compression fracture.  Back  pain EXAM: CT THORACIC SPINE WITHOUT CONTRAST TECHNIQUE: Multidetector CT images of the thoracic were obtained using the standard protocol without intravenous contrast. RADIATION DOSE REDUCTION: This exam was performed according to the departmental dose-optimization program which includes automated exposure control, adjustment of the mA and/or kV according to patient size and/or use of iterative reconstruction technique. COMPARISON:  Lumbar spine radiographs 07/28/2021. CT abdomen pelvis 06/22/2017 FINDINGS: Alignment: Normal Vertebrae: Moderate compression fracture of T12. This appears chronic but new since 2019. No acute fracture line identified. No bony retropulsion into the canal. No spinal stenosis. Paraspinal and other soft tissues: Approximally 3 cm nodule right thyroid. Mild atherosclerotic aorta. Visualized lungs clear. No pleural effusion. No paraspinous edema around the T12 level. Large right renal cyst approximately 9 cm in diameter. Disc levels: No thoracic spinal stenosis.  No disc protrusion. Cervical spondylosis with disc degeneration and spurring C5-6, C6-7, C7-T1. IMPRESSION: Moderate compression fracture T12 appears chronic. This was not present in 2019. Correlate with history and symptoms. No acute fracture identified Negative for thoracic spinal stenosis. 3 cm right thyroid nodule. Thyroid  ultrasound recommended. (Ref: J Am Coll Radiol. 2015 Feb;12(2): 143-50). Electronically Signed   By: Franchot Gallo M.D.   On: 07/28/2021 18:45  ? ?DG Wrist Navic Only Left ? ?Result Date: 07/28/2021 ?CLINICAL D

## 2021-07-28 NOTE — ED Triage Notes (Signed)
Patient here POV from Home with Wrist Pain. ? ?Endorses falling 1 hour PTA when he stepped off a Ladder incorrectly and fell backwards. Endorses Pain to Left Wrist. ? ?No LOC. No Head Injury. No Neck Pain. No Anticoagulants.  ? ?NAD Noted during Triage. A&Ox4. GCS 15. Ambulatory. ?

## 2021-08-21 DIAGNOSIS — D649 Anemia, unspecified: Secondary | ICD-10-CM | POA: Diagnosis not present

## 2021-08-21 DIAGNOSIS — R7303 Prediabetes: Secondary | ICD-10-CM | POA: Diagnosis not present

## 2021-08-21 DIAGNOSIS — Z789 Other specified health status: Secondary | ICD-10-CM | POA: Diagnosis not present

## 2021-08-21 DIAGNOSIS — R7309 Other abnormal glucose: Secondary | ICD-10-CM | POA: Diagnosis not present

## 2021-08-26 DIAGNOSIS — Z1211 Encounter for screening for malignant neoplasm of colon: Secondary | ICD-10-CM | POA: Diagnosis not present

## 2021-09-16 DIAGNOSIS — Z1211 Encounter for screening for malignant neoplasm of colon: Secondary | ICD-10-CM | POA: Diagnosis not present

## 2021-09-22 ENCOUNTER — Other Ambulatory Visit: Payer: Self-pay | Admitting: Gastroenterology

## 2021-09-22 DIAGNOSIS — D649 Anemia, unspecified: Secondary | ICD-10-CM | POA: Diagnosis not present

## 2021-09-22 DIAGNOSIS — R634 Abnormal weight loss: Secondary | ICD-10-CM

## 2021-09-22 DIAGNOSIS — R195 Other fecal abnormalities: Secondary | ICD-10-CM | POA: Diagnosis not present

## 2021-09-30 DIAGNOSIS — I10 Essential (primary) hypertension: Secondary | ICD-10-CM | POA: Diagnosis not present

## 2021-09-30 DIAGNOSIS — R195 Other fecal abnormalities: Secondary | ICD-10-CM | POA: Diagnosis not present

## 2021-10-11 DIAGNOSIS — L03213 Periorbital cellulitis: Secondary | ICD-10-CM | POA: Diagnosis not present

## 2021-10-16 ENCOUNTER — Ambulatory Visit
Admission: RE | Admit: 2021-10-16 | Discharge: 2021-10-16 | Disposition: A | Discharge status: Home / Self Care | Payer: PPO | Source: Ambulatory Visit | Attending: Gastroenterology | Admitting: Gastroenterology

## 2021-10-16 DIAGNOSIS — N3289 Other specified disorders of bladder: Secondary | ICD-10-CM | POA: Diagnosis not present

## 2021-10-16 DIAGNOSIS — R634 Abnormal weight loss: Secondary | ICD-10-CM

## 2021-10-16 DIAGNOSIS — N281 Cyst of kidney, acquired: Secondary | ICD-10-CM | POA: Diagnosis not present

## 2021-10-16 DIAGNOSIS — K769 Liver disease, unspecified: Secondary | ICD-10-CM | POA: Diagnosis not present

## 2021-10-16 MED ORDER — IOPAMIDOL (ISOVUE-300) INJECTION 61%
100.0000 mL | Freq: Once | INTRAVENOUS | Status: AC | PRN
  Administered 2021-10-16: 100 mL via INTRAVENOUS

## 2021-10-20 DIAGNOSIS — K3189 Other diseases of stomach and duodenum: Secondary | ICD-10-CM | POA: Diagnosis not present

## 2021-10-20 DIAGNOSIS — K449 Diaphragmatic hernia without obstruction or gangrene: Secondary | ICD-10-CM | POA: Diagnosis not present

## 2021-10-20 DIAGNOSIS — K2100 Gastro-esophageal reflux disease with esophagitis, without bleeding: Secondary | ICD-10-CM | POA: Diagnosis not present

## 2021-10-20 DIAGNOSIS — D509 Iron deficiency anemia, unspecified: Secondary | ICD-10-CM | POA: Diagnosis not present

## 2021-10-20 DIAGNOSIS — K319 Disease of stomach and duodenum, unspecified: Secondary | ICD-10-CM | POA: Diagnosis not present

## 2021-10-20 DIAGNOSIS — R634 Abnormal weight loss: Secondary | ICD-10-CM | POA: Diagnosis not present

## 2021-10-22 DIAGNOSIS — K319 Disease of stomach and duodenum, unspecified: Secondary | ICD-10-CM | POA: Diagnosis not present

## 2021-10-27 DIAGNOSIS — R195 Other fecal abnormalities: Secondary | ICD-10-CM | POA: Diagnosis not present

## 2021-10-27 DIAGNOSIS — I10 Essential (primary) hypertension: Secondary | ICD-10-CM | POA: Diagnosis not present

## 2021-11-26 DIAGNOSIS — K2100 Gastro-esophageal reflux disease with esophagitis, without bleeding: Secondary | ICD-10-CM | POA: Diagnosis not present

## 2021-11-26 DIAGNOSIS — E611 Iron deficiency: Secondary | ICD-10-CM | POA: Diagnosis not present

## 2021-12-21 DIAGNOSIS — E782 Mixed hyperlipidemia: Secondary | ICD-10-CM | POA: Diagnosis not present

## 2021-12-21 DIAGNOSIS — N183 Chronic kidney disease, stage 3 unspecified: Secondary | ICD-10-CM | POA: Diagnosis not present

## 2021-12-21 DIAGNOSIS — I10 Essential (primary) hypertension: Secondary | ICD-10-CM | POA: Diagnosis not present

## 2021-12-21 DIAGNOSIS — G4700 Insomnia, unspecified: Secondary | ICD-10-CM | POA: Diagnosis not present

## 2021-12-21 DIAGNOSIS — F330 Major depressive disorder, recurrent, mild: Secondary | ICD-10-CM | POA: Diagnosis not present

## 2021-12-21 DIAGNOSIS — N401 Enlarged prostate with lower urinary tract symptoms: Secondary | ICD-10-CM | POA: Diagnosis not present

## 2022-01-14 DIAGNOSIS — E559 Vitamin D deficiency, unspecified: Secondary | ICD-10-CM | POA: Diagnosis not present

## 2022-01-14 DIAGNOSIS — Z23 Encounter for immunization: Secondary | ICD-10-CM | POA: Diagnosis not present

## 2022-01-14 DIAGNOSIS — Z0000 Encounter for general adult medical examination without abnormal findings: Secondary | ICD-10-CM | POA: Diagnosis not present

## 2022-01-14 DIAGNOSIS — N183 Chronic kidney disease, stage 3 unspecified: Secondary | ICD-10-CM | POA: Diagnosis not present

## 2022-01-14 DIAGNOSIS — F419 Anxiety disorder, unspecified: Secondary | ICD-10-CM | POA: Diagnosis not present

## 2022-01-14 DIAGNOSIS — D509 Iron deficiency anemia, unspecified: Secondary | ICD-10-CM | POA: Diagnosis not present

## 2022-01-14 DIAGNOSIS — Z136 Encounter for screening for cardiovascular disorders: Secondary | ICD-10-CM | POA: Diagnosis not present

## 2022-01-14 DIAGNOSIS — I10 Essential (primary) hypertension: Secondary | ICD-10-CM | POA: Diagnosis not present

## 2022-01-14 DIAGNOSIS — Z13220 Encounter for screening for lipoid disorders: Secondary | ICD-10-CM | POA: Diagnosis not present

## 2022-01-14 DIAGNOSIS — F324 Major depressive disorder, single episode, in partial remission: Secondary | ICD-10-CM | POA: Diagnosis not present

## 2022-01-14 DIAGNOSIS — Z1331 Encounter for screening for depression: Secondary | ICD-10-CM | POA: Diagnosis not present

## 2022-01-14 DIAGNOSIS — F330 Major depressive disorder, recurrent, mild: Secondary | ICD-10-CM | POA: Diagnosis not present

## 2022-01-14 DIAGNOSIS — I2510 Atherosclerotic heart disease of native coronary artery without angina pectoris: Secondary | ICD-10-CM | POA: Diagnosis not present

## 2022-01-14 DIAGNOSIS — K2950 Unspecified chronic gastritis without bleeding: Secondary | ICD-10-CM | POA: Diagnosis not present

## 2022-01-14 DIAGNOSIS — R7303 Prediabetes: Secondary | ICD-10-CM | POA: Diagnosis not present

## 2022-01-19 DIAGNOSIS — F330 Major depressive disorder, recurrent, mild: Secondary | ICD-10-CM | POA: Diagnosis not present

## 2022-01-19 DIAGNOSIS — I10 Essential (primary) hypertension: Secondary | ICD-10-CM | POA: Diagnosis not present

## 2022-01-19 DIAGNOSIS — D509 Iron deficiency anemia, unspecified: Secondary | ICD-10-CM | POA: Diagnosis not present

## 2022-01-19 DIAGNOSIS — E782 Mixed hyperlipidemia: Secondary | ICD-10-CM | POA: Diagnosis not present

## 2022-01-19 DIAGNOSIS — N183 Chronic kidney disease, stage 3 unspecified: Secondary | ICD-10-CM | POA: Diagnosis not present

## 2022-01-19 DIAGNOSIS — G4700 Insomnia, unspecified: Secondary | ICD-10-CM | POA: Diagnosis not present

## 2022-01-20 NOTE — Progress Notes (Unsigned)
Cardiology Clinic Note   Patient Name: Lucas Hardy Date of Encounter: 01/21/2022  Primary Care Provider:  Josetta Huddle, MD Primary Cardiologist:  Candee Furbish, MD  Patient Profile    Lucas Hardy is a 76 year-old male who presents to the clinic today for 37-monthfollow-up of chronic cardiac conditions.  Past Medical History    Past Medical History:  Diagnosis Date   Anxiety    Aortic atherosclerosis (HCC)    Arthritis    degenerative of hand,  lumbar,  elbow   Benign essential tremor    Bladder outlet obstruction    BPH without urinary obstruction    CKD (chronic kidney disease), stage III ([facility]    Coronary artery disease    cardiologist-  dr sMarlou Porch-  per cardiac cath 05-31-2017  mild nonobstructive cad w/ normal lvedp   Depression    Diastolic murmur    ED (erectile dysfunction)    Fatigue    Foley catheter in place    GERD (gastroesophageal reflux disease)    Hemorrhoids    History of exercise stress test 05-26-2017   dr sMarlou Porch  abnormal test , ischemic changes;  cardiac cath done 05-31-2017   History of kidney stones    History of melanoma excision    left lower extremity in 2015 approx.-- early malignant    History of pituitary tumor 1997   s/p  resection benign tumor   History of pyelonephritis 07/29/2017   due to UTI   Hydronephrosis, right    Hypertension    MVP (mitral valve prolapse)    w/ grade 2 regurg.  per echo 03-09-2017   Radiculopathy of lumbar region    Restless legs syndrome (RLS)    Solitary lung nodule    last CT 06-03-2016 stable, benign   Vitamin D deficiency    Wears glasses    Wears hearing aid in both ears    Past Surgical History:  Procedure Laterality Date   CYSTOSCOPY WITH STENT PLACEMENT Right 06/22/2017   Procedure: CYSTOSCOPY WITH RIGHT URETERAL STENT PLACEMENT;  Surgeon: BRaynelle Bring MD;  Location: [facility];  Service: Urology;  Laterality: Right;   CYSTOSCOPY/URETEROSCOPY/HOLMIUM LASER/STENT PLACEMENT Right 08/18/2017    Procedure: CYSTOSCOPIY/RIGHT RETROGRADE PYLOGRAM/RIGHT URETEROSCOPY;  Surgeon: HArdis Hughs MD;  [facility]  Service: Urology;  Laterality: Right;   FINGER SURGERY Left ?   reattach left index finger amputation   INGUINAL HERNIA REPAIR Left 1998  approx.   LEFT HEART CATH AND CORONARY ANGIOGRAPHY N/A 05/31/2017   Procedure: LEFT HEART CATH AND CORONARY ANGIOGRAPHY;  Surgeon: JMartinique Peter M, MD;  [facility];  Service: Cardiovascular;  Laterality: N/A;  abnormal ETT;  minor nonobstructive CAD (dRCA 20%, post astrio 30%), low LVEDP   TONSILLECTOMY  ~ 1[place]OCARDIOGRAM  03/10/2017   mild concentric LVH, ef 688-75% grade 1 diastolic dysfunction/ mild dilated left artrial cavity/ mild myxomatous degeneration and bileaflet prolapse with moderate (grade 1) MR/  mild TR/  trace AR and PR   TRANSURETHRAL RESECTION OF PROSTATE N/A 08/18/2017   Procedure: TRANSURETHRAL RESECTION OF THE PROSTATE (TURP);  Surgeon: HArdis Hughs MD;  [facility]  Service: Urology;  Laterality: N/A;    Allergies  Allergies  Allergen Reactions   Atorvastatin     Other reaction(s): myalgias   Effexor [Venlafaxine]     Anxiety    Ciprofloxacin Nausea And Vomiting  Tolerated levoquin for 2 weeks as outpatient    History of Present Illness    Lucas Hardy has a past medical history of: Nonobstructive CAD Exercise stress test 05/26/2017: BP demonstrated normal response to exercise.  Horizontal ST segment depression 2 mm was noted during stress in II, III aVF, V4 and V5 leads without chest pain.  Abnormal test with ischemic changes.  Proceeding with heart catheterization. Heart catheterization 05/31/2017: Distal RCA 25% stenosed, post atrio lesion 30% stenosed, LV end-diastolic pressure normal.  Minor nonobstructive CAD low LVEDP Echo 05/29/2018: EF 60 to 65%.  Mild thickening of  the mitral valve leaflet. Mild thickening of the aortic valve.  Evidence of plaque in the ascending aorta.  Mild dilation of the aortic root measuring 40 mm. Echo 04/07/2020: EF 60 to 65%.  Mild left ventricular hypertrophy.  Grade I DD.  Left atrial size mildly dilated.  Mitral valve regurgitation without stenosis.  Moderate pulmonic valve regurgitation.  Aortic root now normal.  He was last seen in the office by Dr. Marlou Porch on 07/09/2021 for hypotension.  At that visit his amlodipine 5 mg daily was continued and HCTZ 12.5 mg was discontinued.  Today, patient is accompanied by his wife.  He states he is doing very well. He denies palpitations, chest pain, shortness of breath, DOE, or edema.  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  He follows a healthy diet.  Patient's aspirin was stopped by PCP, Dr. Inda Merlin, secondary to occult blood found in his stool.  His iron was found to be low. He had a colonoscopy approximately a year prior so that was not repeated. He did undergo endoscopy and no source of the bleeding was found.  They did find some irritation of his esophagus and stomach so he was started on Protonix 40 mg every other day.  He donates blood frequently.  He has not had his stool rechecked since endoscopy.  He denies feelings of fatigue or weakness.   Home Medications    Current Meds  Medication Sig   acetaminophen (TYLENOL) 500 MG tablet Take 1,000 mg by mouth daily as needed for fever.   amLODipine (NORVASC) 5 MG tablet Take 5 mg by mouth every morning.   Cholecalciferol 2000 units CAPS Take 2,000 Units by mouth daily.   FLUoxetine (PROZAC) 40 MG capsule Take 40 mg by mouth every morning.   Multiple Vitamin (MULTIVITAMIN WITH MINERALS) TABS tablet Take 1 tablet by mouth daily.   oxyCODONE-acetaminophen (PERCOCET/ROXICET) 5-325 MG tablet Take 1 tablet by mouth every 6 (six) hours as needed for severe pain.   pantoprazole (PROTONIX)  40 MG tablet Take 40 mg by mouth every other day.   rosuvastatin (CRESTOR) 20 MG tablet Take 20 mg by mouth every morning.    traZODone (DESYREL) 50 MG tablet Take 25-50 mg by mouth at bedtime.   valsartan (DIOVAN) 80 MG tablet Take 80 mg by mouth 2 (two) times daily.   Current Facility-Administered Medications for the 01/21/22 encounter (Office Visit) with Leanor Kail, PA  Medication   0.9 %  sodium chloride infusion    Family History    Family History  Problem Relation Age of Onset   Hypertension Father    He indicated that his mother is deceased. He indicated that his father is deceased. He indicated that his maternal grandmother is deceased. He indicated that his maternal grandfather is deceased. He indicated that his paternal grandmother is  deceased. He indicated that his paternal grandfather is deceased.   Social History    Social History   Socioeconomic History   Marital status: Married    Spouse name: Not on file   Number of children: Not on file   Years of education: Not on file   Highest education level: Not on file  Occupational History   Not on file  Tobacco Use   Smoking status: Former    Packs/day: 1.00    Years: 15.00    Total pack years: 15.00    Types: Cigarettes    Quit date: 04/11/1978    Years since quitting: 43.8   Smokeless tobacco: Former    Types: Chew    Quit date: 08/13/1998  Vaping Use   Vaping Use: Never used  Substance and Sexual Activity   Alcohol use: Yes    Comment: rare   Drug use: No   Sexual activity: Not Currently  Other Topics Concern   Not on file  Social History Narrative   Not on file   Social Determinants of Health   Financial Resource Strain: Not on file  Food Insecurity: Not on file  Transportation Needs: Not on file  Physical Activity: Not on file  Stress: Not on file  Social Connections: Not on file  Intimate Partner Violence: Not on file     Review of Systems    General: No chills, fever, night  sweats or weight changes.  Cardiovascular:  No chest pain, dyspnea on exertion, edema, orthopnea, palpitations, paroxysmal nocturnal dyspnea. Dermatological: No rash, lesions/masses Respiratory: No cough, dyspnea Urologic: No hematuria, dysuria Abdominal:   No nausea, vomiting, diarrhea, bright red blood per rectum, melena, or hematemesis Neurologic:  No visual changes, weakness, changes in mental status. All other systems reviewed and are otherwise negative except as noted above.  Physical Exam    VS:  BP 116/62   Pulse (!) 50   Ht 5' 10.4" (1.788 m)   Wt 172 lb (78 kg)   SpO2 97%   BMI 24.40 kg/m  , BMI Body mass index is 24.4 kg/m. GEN: Well nourished, well developed, in no acute distress. HEENT: Normal. Neck: Supple, no JVD, carotid bruits, or masses. Cardiac: RRR, no murmurs, rubs, or gallops. No clubbing, cyanosis, edema.  Radials/DP/PT 2+ and equal bilaterally.  Respiratory:  Respirations regular and unlabored, clear to auscultation bilaterally. GI: Soft, nontender, nondistended. MS: No deformity or atrophy. Skin: Warm and dry, no rash. Neuro: Strength and sensation are intact. Psych: Normal affect.  Accessory Clinical Findings    The following studies were reviewed for this visit: Left heart catheterization 05/31/2017: Conclusion Dist RCA lesion is 25% stenosed. Post Atrio lesion is 30% stenosed. LV end diastolic pressure is normal.   1. Minor nonobstructive CAD 2. Low LVEDP   Plan: medical management. Will hydrate for 4 hours and plan DC this evening.   Echo: 05/29/2018: IMPRESSIONS    1. The left ventricle has normal systolic function with an ejection  fraction of 60-65%. The cavity size was normal. Left ventricular diastolic Doppler parameters are consistent with impaired relaxation.   2. The mitral valve is normal in structure. Mild thickening of the mitral valve leaflet.   3. The tricuspid valve is normal in structure.   4. The aortic valve is tricuspid  Mild thickening of the aortic valve.   5. The pulmonic valve was normal in structure. Pulmonic valve  regurgitation is mild by color flow Doppler.   6. There is evidence of  plaque in the ascending aorta.   7. There is mild dilatation of the aortic root measuring 40 mm.   04/07/2020: IMPRESSIONS    1. Left ventricular ejection fraction, by estimation, is 60 to 65%. The  left ventricle has normal function. The left ventricle has no regional  wall motion abnormalities. There is mild left ventricular hypertrophy.  Left ventricular diastolic parameters are consistent with Grade I diastolic dysfunction (impaired relaxation).   2. Right ventricular systolic function is normal. The right ventricular  size is normal.   3. Left atrial size was mildly dilated.   4. The mitral valve is normal in structure. Mild mitral valve  regurgitation. No evidence of mitral stenosis.   5. The aortic valve is normal in structure. Aortic valve regurgitation is  not visualized. No aortic stenosis is present.   6. Pulmonic valve regurgitation is moderate.   7. The inferior vena cava is normal in size with greater than 50%  respiratory variability, suggesting right atrial pressure of 3 mmHg.   Comparison(s): Aortic root prior measure 40 mm, now normal. Recent Labs: From PCP 11/26/2021: BUN 21, creatinine 1.1-0, EGFR 68, glucose 94, hemoglobin 13.3, potassium 4.7  Recent Lipid Panel From PCP 01/14/2022: LDL 88, HDL 63, triglycerides 89.      ECG was not performed today.   Assessment & Plan   CAD.  Patient denies chest pain, shortness of breath, DOE.  He walks 4 miles 6 days a week.  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  His aspirin was discontinued by PCP secondary to occult blood found in his stool.  Given the amount of exercise he does almost daily and his nonobstructive CAD per catheterization in 2019, patient is okay to forego aspirin. Hyperlipidemia.  Last lipid panel 01/14/2022  showed LDL 88, HDL 63, triglycerides 89.  Continue Crestor 20 mg daily. Hypertension.  BP 116/62 today.  No complaints of dizziness.  Continue amlodipine 5 mg daily.   Disposition: Continue current medication regimen.  Follow-up in 6 months or sooner as needed.   Justice Britain. Preslynn Bier, NP-C     01/21/2022, 2:22 PM [address] Office [phone] Fax [phone]   I spent 9 minutes examining this patient, reviewing medications, and using patient centered shared decision making involving her cardiac care.  Prior to her visit I spent greater than 20 minutes reviewing her past medical history,  medications, and prior cardiac tests.

## 2022-01-21 ENCOUNTER — Ambulatory Visit: Payer: PPO | Attending: Physician Assistant | Admitting: Student

## 2022-01-21 ENCOUNTER — Encounter: Payer: Self-pay | Admitting: Physician Assistant

## 2022-01-21 VITALS — BP 116/62 | HR 50 | Ht 70.4 in | Wt 172.0 lb

## 2022-01-21 DIAGNOSIS — I10 Essential (primary) hypertension: Secondary | ICD-10-CM

## 2022-01-21 DIAGNOSIS — E782 Mixed hyperlipidemia: Secondary | ICD-10-CM

## 2022-01-21 DIAGNOSIS — I2510 Atherosclerotic heart disease of native coronary artery without angina pectoris: Secondary | ICD-10-CM | POA: Diagnosis not present

## 2022-01-21 NOTE — Patient Instructions (Signed)
Medication Instructions:  Your physician recommends that you continue on your current medications as directed. Please refer to the Current Medication list given to you today. *If you need a refill on your cardiac medications before your next appointment, please call your pharmacy*   Lab Work: None ordered   Testing/Procedures: None Ordered   Follow-Up: At [facility], you and your health needs are our priority.  As part of our continuing mission to provide you with exceptional heart care, we have created designated Provider Care Teams.  These Care Teams include your primary Cardiologist (physician) and Advanced Practice Providers (APPs -  Physician Assistants and Nurse Practitioners) who all work together to provide you with the care you need, when you need it.  We recommend signing up for the patient portal called "MyChart".  Sign up information is provided on this After Visit Summary.  MyChart is used to connect with patients for Virtual Visits (Telemedicine).  Patients are able to view lab/test results, encounter notes, upcoming appointments, etc.  Non-urgent messages can be sent to your provider as well.   To learn more about what you can do with MyChart, go to [email or web address].    Your next appointment:   6 month(s)  The format for your next appointment:   In Person  Provider:   Candee Furbish, MD     Other Instructions   Important Information About Sugar

## 2022-02-16 DIAGNOSIS — L821 Other seborrheic keratosis: Secondary | ICD-10-CM | POA: Diagnosis not present

## 2022-02-16 DIAGNOSIS — Z08 Encounter for follow-up examination after completed treatment for malignant neoplasm: Secondary | ICD-10-CM | POA: Diagnosis not present

## 2022-02-16 DIAGNOSIS — Z85820 Personal history of malignant melanoma of skin: Secondary | ICD-10-CM | POA: Diagnosis not present

## 2022-02-16 DIAGNOSIS — D1801 Hemangioma of skin and subcutaneous tissue: Secondary | ICD-10-CM | POA: Diagnosis not present

## 2022-02-16 DIAGNOSIS — L814 Other melanin hyperpigmentation: Secondary | ICD-10-CM | POA: Diagnosis not present

## 2022-03-22 DIAGNOSIS — U071 COVID-19: Secondary | ICD-10-CM | POA: Diagnosis not present

## 2022-03-22 DIAGNOSIS — J988 Other specified respiratory disorders: Secondary | ICD-10-CM | POA: Diagnosis not present

## 2022-03-22 DIAGNOSIS — R051 Acute cough: Secondary | ICD-10-CM | POA: Diagnosis not present

## 2022-06-07 ENCOUNTER — Encounter: Payer: Self-pay | Admitting: Cardiology

## 2022-06-07 ENCOUNTER — Ambulatory Visit: Payer: PPO | Attending: Cardiology | Admitting: Cardiology

## 2022-06-07 VITALS — BP 126/82 | HR 52 | Ht 70.5 in | Wt 171.0 lb

## 2022-06-07 DIAGNOSIS — I10 Essential (primary) hypertension: Secondary | ICD-10-CM

## 2022-06-07 DIAGNOSIS — I77810 Thoracic aortic ectasia: Secondary | ICD-10-CM

## 2022-06-07 DIAGNOSIS — I2510 Atherosclerotic heart disease of native coronary artery without angina pectoris: Secondary | ICD-10-CM

## 2022-06-07 DIAGNOSIS — R143 Flatulence: Secondary | ICD-10-CM | POA: Diagnosis not present

## 2022-06-07 DIAGNOSIS — E782 Mixed hyperlipidemia: Secondary | ICD-10-CM

## 2022-06-07 DIAGNOSIS — D509 Iron deficiency anemia, unspecified: Secondary | ICD-10-CM | POA: Diagnosis not present

## 2022-06-07 NOTE — Progress Notes (Signed)
Cardiology Office Note:    Date:  06/07/2022   ID:  Lucas [facility], DOB Jan 31, 1946, MRN [id number]  PCP:  Kathalene Frames, MD  [facility] Cardiologist:  Candee Furbish, MD  [facility] Electrophysiologist:  None   Referring MD: Josetta Huddle, MD    History of Present Illness:    Lucas Hardy is a 77 y.o. male here for the follow-up of hypotension.  At prior visit his blood pressure was 90/60 on 01/16/2020.  He had an episode of syncope after reviewing ER notes from April 2021.  He notes that his blood pressure after walking on a treadmill for 3 miles at the gym can be in the 90s for a while after exercise.  I discontinued his amlodipine 5 mg and continued with the HCTZ 12.5 in AM valsartan 80 mg at night.  He also has coronary plaque noted on CT scan.  Prior cardiac catheterization performed on 05/31/2017 showed no high risk lesions.  Nonobstructive disease  Today: He is accompanied by his wife Lucas [facility], who is also a patient of mine. They both have appointments scheduled today and wish to be seen together.  Overall, he is feeling well.  He has been off and on of amlodipine.  He sometimes is having orthostatic type symptoms at home.  I will go ahead and stop this once again.  He is experiencing labile bp when he takes walks and when he is not active. He walks up to 4 miles, 6 days out of the week. In the mornings his bp is typically higher.   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    Past Medical History:  Diagnosis Date   Anxiety    Aortic atherosclerosis (HCC)    Arthritis    degenerative of hand,  lumbar,  elbow   Benign essential tremor    Bladder outlet obstruction    BPH without urinary obstruction    CKD (chronic kidney disease), stage III [facility])    Coronary artery disease    cardiologist-  dr Marlou Porch--  per cardiac cath 05-31-2017  mild nonobstructive cad w/ normal lvedp   Depression    Diastolic murmur     ED (erectile dysfunction)    Fatigue    Foley catheter in place    GERD (gastroesophageal reflux disease)    Hemorrhoids    History of exercise stress test 05-26-2017   dr Marlou Porch   abnormal test , ischemic changes;  cardiac cath done 05-31-2017   History of kidney stones    History of melanoma excision    left lower extremity in 2015 approx.-- early malignant    History of pituitary tumor 1997   s/p  resection benign tumor   History of pyelonephritis 07/29/2017   due to UTI   Hydronephrosis, right    Hypertension    MVP (mitral valve prolapse)    w/ grade 2 regurg.  per echo 03-09-2017   Radiculopathy of lumbar region    Restless legs syndrome (RLS)    Solitary lung nodule    last CT 06-03-2016 stable, benign   Vitamin D deficiency    Wears glasses    Wears hearing aid in both ears     Past Surgical History:  Procedure Laterality Date   CYSTOSCOPY WITH STENT PLACEMENT Right 06/22/2017   Procedure: CYSTOSCOPY WITH RIGHT URETERAL STENT PLACEMENT;  Surgeon: Raynelle Bring, MD;  Location: [facility];  Service: Urology;  Laterality: Right;   CYSTOSCOPY/URETEROSCOPY/HOLMIUM  LASER/STENT PLACEMENT Right 08/18/2017   Procedure: CYSTOSCOPIY/RIGHT RETROGRADE PYLOGRAM/RIGHT URETEROSCOPY;  Surgeon: Ardis Hughs, MD;  Location: [facility];  Service: Urology;  Laterality: Right;   FINGER SURGERY Left ?   reattach left index finger amputation   INGUINAL HERNIA REPAIR Left 1998  approx.   LEFT HEART CATH AND CORONARY ANGIOGRAPHY N/A 05/31/2017   Procedure: LEFT HEART CATH AND CORONARY ANGIOGRAPHY;  Surgeon: Martinique, Peter M, MD;  Location: [facility];  Service: Cardiovascular;  Laterality: N/A;  abnormal ETT;  minor nonobstructive CAD (dRCA 20%, post astrio 30%), low LVEDP   TONSILLECTOMY  ~ [place] ECHOCARDIOGRAM  03/10/2017   mild concentric LVH, ef [id number], grade 1 diastolic dysfunction/ mild dilated left artrial  cavity/ mild myxomatous degeneration and bileaflet prolapse with moderate (grade 1) MR/  mild TR/  trace AR and PR   TRANSURETHRAL RESECTION OF PROSTATE N/A 08/18/2017   Procedure: TRANSURETHRAL RESECTION OF THE PROSTATE (TURP);  Surgeon: Ardis Hughs, MD;  Location: [facility];  Service: Urology;  Laterality: N/A;    Current Medications: Current Meds  Medication Sig   acetaminophen (TYLENOL) 500 MG tablet Take 1,000 mg by mouth daily as needed for fever.   Cholecalciferol 2000 units CAPS Take 2,000 Units by mouth daily.   FLUoxetine (PROZAC) 40 MG capsule Take 40 mg by mouth every morning.   Multiple Vitamin (MULTIVITAMIN WITH MINERALS) TABS tablet Take 1 tablet by mouth daily.   pantoprazole (PROTONIX) 40 MG tablet Take 40 mg by mouth every other day.   rosuvastatin (CRESTOR) 20 MG tablet Take 20 mg by mouth every morning.    traZODone (DESYREL) 50 MG tablet Take 25-50 mg by mouth at bedtime.   valsartan (DIOVAN) 80 MG tablet Take 80 mg by mouth 2 (two) times daily.   [DISCONTINUED] amLODipine (NORVASC) 5 MG tablet Take 5 mg by mouth every morning.   Current Facility-Administered Medications for the 06/07/22 encounter (Office Visit) with Jerline Pain, MD  Medication   0.9 %  sodium chloride infusion     Allergies:   Atorvastatin, Effexor [venlafaxine], and Ciprofloxacin   Social History   Socioeconomic History   Marital status: Married    Spouse name: Not on file   Number of children: Not on file   Years of education: Not on file   Highest education level: Not on file  Occupational History   Not on file  Tobacco Use   Smoking status: Former    Packs/day: 1.00    Years: 15.00    Total pack years: 15.00    Types: Cigarettes    Quit date: 04/11/1978    Years since quitting: 44.1   Smokeless tobacco: Former    Types: Chew    Quit date: 08/13/1998  Vaping Use   Vaping Use: Never used  Substance and Sexual Activity   Alcohol use: Yes    Comment: rare    Drug use: No   Sexual activity: Not Currently  Other Topics Concern   Not on file  Social History Narrative   Not on file   Social Determinants of Health   Financial Resource Strain: Not on file  Food Insecurity: Not on file  Transportation Needs: Not on file  Physical Activity: Not on file  Stress: Not on file  Social Connections: Not on file     Family History: The patient's family history includes Hypertension in his father.  ROS:  Please see the history of present illness.   (+) Labile Blood Pressure (+) Remote syncope All other systems reviewed and are negative.  EKGs/Labs/Other Studies Reviewed:    The following studies were reviewed today:  EKG 06/07/2022: Sinus bradycardia 52 rightward axis  Echo 04/07/2020: IMPRESSIONS     1. Left ventricular ejection fraction, by estimation, is 60 to 65%. The  left ventricle has normal function. The left ventricle has no regional  wall motion abnormalities. There is mild left ventricular hypertrophy.  Left ventricular diastolic parameters  are consistent with Grade I diastolic dysfunction (impaired relaxation).   2. Right ventricular systolic function is normal. The right ventricular  size is normal.   3. Left atrial size was mildly dilated.   4. The mitral valve is normal in structure. Mild mitral valve  regurgitation. No evidence of mitral stenosis.   5. The aortic valve is normal in structure. Aortic valve regurgitation is  not visualized. No aortic stenosis is present.   6. Pulmonic valve regurgitation is moderate.   7. The inferior vena cava is normal in size with greater than 50%  respiratory variability, suggesting right atrial pressure of 3 mmHg.   Comparison(s): Aortic root prior measure 40 mm, now normal. ECHO 05/29/2018   1. The left ventricle has normal systolic function with an ejection  fraction of 60-65%. The cavity size was normal. Left ventricular diastolic  Doppler parameters are consistent with  impaired relaxation.   2. The mitral valve is normal in structure. Mild thickening of the mitral  valve leaflet.   3. The tricuspid valve is normal in structure.   4. The aortic valve is tricuspid Mild thickening of the aortic valve.   5. The pulmonic valve was normal in structure. Pulmonic valve  regurgitation is mild by color flow Doppler.   6. There is evidence of plaque in the ascending aorta.   7. There is mild dilatation of the aortic root measuring 40 mm.   Cath 05/31/2017: Dist RCA lesion is 25% stenosed. Post Atrio lesion is 30% stenosed. LV end diastolic pressure is normal.   1. Minor nonobstructive CAD 2. Low LVEDP  CT Chest 06/03/2016: FINDINGS: Cardiovascular: Heart is normal size. Aorta is normal caliber. Scattered coronary artery calcifications in the left anterior descending coronary artery. Scattered aortic calcifications.   Mediastinum/Nodes: No mediastinal, hilar, or axillary adenopathy. Trachea and esophagus unremarkable.   Lungs/Pleura: Nodule noted at the left lung base on image 86 measures up to 6.8 mm compared to 6.5 mm previously, not significantly changed. No new pulmonary nodules or effusions. No confluent airspace opacities.   Upper Abdomen: Imaging into the upper abdomen shows no acute findings.   Musculoskeletal: Chest wall soft tissues are unremarkable. No acute bony abnormality.   IMPRESSION: Stable approximately 7 mm nodule in the left lung base, stable for over 3 years compatible with a benign process. No additional follow-up necessary.   Scattered coronary artery and aortic calcifications.  Recent Labs: No results found for requested labs within last 365 days.  Recent Lipid Panel No results found for: "CHOL", "TRIG", "HDL", "CHOLHDL", "VLDL", "LDLCALC", "LDLDIRECT"   Risk Assessment/Calculations:       Physical Exam:    VS:  BP 126/82   Pulse (!) 52   Ht 5' 10.5" (1.791 m)   Wt 171 lb (77.6 kg)   SpO2 95%   BMI 24.19 kg/m      Wt Readings from Last 3 Encounters:  06/07/22 171 lb (77.6 kg)  01/21/22 172  lb (78 kg)  07/28/21 180 lb 12.4 oz (82 kg)    GEN: Well nourished, well developed, in no acute distress HEENT: normal Neck: no JVD, carotid bruits, or masses Cardiac: RRR; no murmurs, rubs, or gallops,no edema  Respiratory:  clear to auscultation bilaterally, normal work of breathing GI: soft, nontender, nondistended, + BS MS: no deformity or atrophy Skin: warm and dry, no rash Neuro:  Alert and Oriented x 3, Strength and sensation are intact Psych: euthymic mood, full affect   ASSESSMENT:    1. Coronary artery disease involving native coronary artery of native heart without angina pectoris   2. Mixed hyperlipidemia   3. Dilated aortic root ([place])   4. Primary hypertension      PLAN:    In order of problems listed above:   Hypotension I had discontinued his amlodipine at 1 point but this was reinstated shortly thereafter by Dr. Inda Merlin after he had quite significantly elevated blood pressure.  I will try to stop this medication once again.  5 mg.  Continue with valsartan 80 mg once a day. Has some orthostatic type symptoms.   Hyperlipidemia - On Crestor 20 mg a day.  No changes.  Last LDL 80 no myalgias.  Coronary artery disease involving native coronary artery of native heart without angina pectoris Continue with Crestor aspirin secondary risk factor prevention.  Prior cardiac catheterization in 2019 as above.  No changes made    Dilated aortic root (HCC) 40 mm previously.  Echocardiogram on repeat showed less than 4 cm.  Stable.    Shared Decision Making/Informed Consent      Follow-up 6 months APP  Medication Adjustments/Labs and Tests Ordered: Current medicines are reviewed at length with the patient today.  Concerns regarding medicines are outlined above.  Orders Placed This Encounter  Procedures   EKG 12-Lead   No orders of the defined types were placed in this  encounter.   Patient Instructions  Medication Instructions:  Please discontinue your Amlodipine. Continue all other medications as listed.  *If you need a refill on your cardiac medications before your next appointment, please call your pharmacy*  Follow-Up: At [facility], you and your health needs are our priority.  As part of our continuing mission to provide you with exceptional heart care, we have created designated Provider Care Teams.  These Care Teams include your primary Cardiologist (physician) and Advanced Practice Providers (APPs -  Physician Assistants and Nurse Practitioners) who all work together to provide you with the care you need, when you need it.  We recommend signing up for the patient portal called "MyChart".  Sign up information is provided on this After Visit Summary.  MyChart is used to connect with patients for Virtual Visits (Telemedicine).  Patients are able to view lab/test results, encounter notes, upcoming appointments, etc.  Non-urgent messages can be sent to your provider as well.   To learn more about what you can do with MyChart, go to [email or web address].    Your next appointment:   1 year(s)  Provider:   Candee Furbish, MD        Signed, Candee Furbish, MD  06/07/2022 5:31 PM    [place]

## 2022-06-07 NOTE — Patient Instructions (Signed)
Medication Instructions:  Please discontinue your Amlodipine. Continue all other medications as listed.  *If you need a refill on your cardiac medications before your next appointment, please call your pharmacy*  Follow-Up: At [facility], you and your health needs are our priority.  As part of our continuing mission to provide you with exceptional heart care, we have created designated Provider Care Teams.  These Care Teams include your primary Cardiologist (physician) and Advanced Practice Providers (APPs -  Physician Assistants and Nurse Practitioners) who all work together to provide you with the care you need, when you need it.  We recommend signing up for the patient portal called "MyChart".  Sign up information is provided on this After Visit Summary.  MyChart is used to connect with patients for Virtual Visits (Telemedicine).  Patients are able to view lab/test results, encounter notes, upcoming appointments, etc.  Non-urgent messages can be sent to your provider as well.   To learn more about what you can do with MyChart, go to [email or web address].    Your next appointment:   1 year(s)  Provider:   Candee Furbish, MD

## 2022-06-26 DIAGNOSIS — R059 Cough, unspecified: Secondary | ICD-10-CM | POA: Diagnosis not present

## 2022-06-26 DIAGNOSIS — J209 Acute bronchitis, unspecified: Secondary | ICD-10-CM | POA: Diagnosis not present

## 2022-06-26 DIAGNOSIS — R509 Fever, unspecified: Secondary | ICD-10-CM | POA: Diagnosis not present

## 2022-06-26 DIAGNOSIS — R0981 Nasal congestion: Secondary | ICD-10-CM | POA: Diagnosis not present

## 2022-07-16 DIAGNOSIS — R7303 Prediabetes: Secondary | ICD-10-CM | POA: Diagnosis not present

## 2022-07-16 DIAGNOSIS — I10 Essential (primary) hypertension: Secondary | ICD-10-CM | POA: Diagnosis not present

## 2022-07-16 DIAGNOSIS — R634 Abnormal weight loss: Secondary | ICD-10-CM | POA: Diagnosis not present

## 2022-07-16 DIAGNOSIS — Z13220 Encounter for screening for lipoid disorders: Secondary | ICD-10-CM | POA: Diagnosis not present

## 2022-07-16 DIAGNOSIS — F330 Major depressive disorder, recurrent, mild: Secondary | ICD-10-CM | POA: Diagnosis not present

## 2022-07-16 DIAGNOSIS — D509 Iron deficiency anemia, unspecified: Secondary | ICD-10-CM | POA: Diagnosis not present

## 2022-07-16 DIAGNOSIS — I2510 Atherosclerotic heart disease of native coronary artery without angina pectoris: Secondary | ICD-10-CM | POA: Diagnosis not present

## 2022-07-16 DIAGNOSIS — N183 Chronic kidney disease, stage 3 unspecified: Secondary | ICD-10-CM | POA: Diagnosis not present

## 2022-07-16 DIAGNOSIS — K2950 Unspecified chronic gastritis without bleeding: Secondary | ICD-10-CM | POA: Diagnosis not present

## 2022-07-16 DIAGNOSIS — Z87448 Personal history of other diseases of urinary system: Secondary | ICD-10-CM | POA: Diagnosis not present

## 2022-07-16 DIAGNOSIS — F419 Anxiety disorder, unspecified: Secondary | ICD-10-CM | POA: Diagnosis not present

## 2022-07-16 DIAGNOSIS — Z125 Encounter for screening for malignant neoplasm of prostate: Secondary | ICD-10-CM | POA: Diagnosis not present

## 2022-08-11 DIAGNOSIS — Z8719 Personal history of other diseases of the digestive system: Secondary | ICD-10-CM | POA: Diagnosis not present

## 2022-08-11 DIAGNOSIS — D508 Other iron deficiency anemias: Secondary | ICD-10-CM | POA: Diagnosis not present

## 2022-08-11 DIAGNOSIS — R634 Abnormal weight loss: Secondary | ICD-10-CM | POA: Diagnosis not present

## 2022-08-13 DIAGNOSIS — H43392 Other vitreous opacities, left eye: Secondary | ICD-10-CM | POA: Diagnosis not present

## 2022-08-13 DIAGNOSIS — H25813 Combined forms of age-related cataract, bilateral: Secondary | ICD-10-CM | POA: Diagnosis not present

## 2022-08-23 DIAGNOSIS — D509 Iron deficiency anemia, unspecified: Secondary | ICD-10-CM | POA: Diagnosis not present

## 2022-10-21 ENCOUNTER — Other Ambulatory Visit: Payer: Self-pay | Admitting: Internal Medicine

## 2022-10-21 ENCOUNTER — Ambulatory Visit
Admission: RE | Admit: 2022-10-21 | Discharge: 2022-10-21 | Disposition: A | Discharge status: Home / Self Care | Payer: PPO | Source: Ambulatory Visit | Attending: Internal Medicine | Admitting: Internal Medicine

## 2022-10-21 DIAGNOSIS — R7303 Prediabetes: Secondary | ICD-10-CM | POA: Diagnosis not present

## 2022-10-21 DIAGNOSIS — I2510 Atherosclerotic heart disease of native coronary artery without angina pectoris: Secondary | ICD-10-CM | POA: Diagnosis not present

## 2022-10-21 DIAGNOSIS — F324 Major depressive disorder, single episode, in partial remission: Secondary | ICD-10-CM | POA: Diagnosis not present

## 2022-10-21 DIAGNOSIS — K2950 Unspecified chronic gastritis without bleeding: Secondary | ICD-10-CM | POA: Diagnosis not present

## 2022-10-21 DIAGNOSIS — R634 Abnormal weight loss: Secondary | ICD-10-CM

## 2022-10-21 DIAGNOSIS — I700 Atherosclerosis of aorta: Secondary | ICD-10-CM | POA: Diagnosis not present

## 2022-10-21 DIAGNOSIS — F419 Anxiety disorder, unspecified: Secondary | ICD-10-CM | POA: Diagnosis not present

## 2022-10-21 DIAGNOSIS — D509 Iron deficiency anemia, unspecified: Secondary | ICD-10-CM | POA: Diagnosis not present

## 2022-11-04 ENCOUNTER — Other Ambulatory Visit: Payer: Self-pay | Admitting: Internal Medicine

## 2022-11-04 DIAGNOSIS — E041 Nontoxic single thyroid nodule: Secondary | ICD-10-CM

## 2022-11-05 ENCOUNTER — Ambulatory Visit
Admission: RE | Admit: 2022-11-05 | Discharge: 2022-11-05 | Disposition: A | Discharge status: Home / Self Care | Payer: PPO | Source: Ambulatory Visit | Attending: Internal Medicine | Admitting: Internal Medicine

## 2022-11-05 DIAGNOSIS — E041 Nontoxic single thyroid nodule: Secondary | ICD-10-CM | POA: Diagnosis not present

## 2022-11-08 ENCOUNTER — Other Ambulatory Visit: Payer: Self-pay | Admitting: Internal Medicine

## 2022-11-08 DIAGNOSIS — E041 Nontoxic single thyroid nodule: Secondary | ICD-10-CM

## 2022-11-09 DIAGNOSIS — N183 Chronic kidney disease, stage 3 unspecified: Secondary | ICD-10-CM | POA: Diagnosis not present

## 2022-11-09 DIAGNOSIS — D509 Iron deficiency anemia, unspecified: Secondary | ICD-10-CM | POA: Diagnosis not present

## 2022-11-16 ENCOUNTER — Ambulatory Visit
Admission: RE | Admit: 2022-11-16 | Discharge: 2022-11-16 | Disposition: A | Discharge status: Home / Self Care | Payer: PPO | Source: Ambulatory Visit | Attending: Internal Medicine | Admitting: Internal Medicine

## 2022-11-16 ENCOUNTER — Other Ambulatory Visit (HOSPITAL_COMMUNITY)
Admission: RE | Admit: 2022-11-16 | Discharge: 2022-11-16 | Disposition: A | Discharge status: Home / Self Care | Payer: PPO | Source: Ambulatory Visit | Attending: Interventional Radiology | Admitting: Interventional Radiology

## 2022-11-16 DIAGNOSIS — E041 Nontoxic single thyroid nodule: Secondary | ICD-10-CM | POA: Diagnosis not present

## 2022-12-02 DIAGNOSIS — I10 Essential (primary) hypertension: Secondary | ICD-10-CM | POA: Diagnosis not present

## 2022-12-02 DIAGNOSIS — F324 Major depressive disorder, single episode, in partial remission: Secondary | ICD-10-CM | POA: Diagnosis not present

## 2022-12-02 DIAGNOSIS — K2950 Unspecified chronic gastritis without bleeding: Secondary | ICD-10-CM | POA: Diagnosis not present

## 2022-12-02 DIAGNOSIS — I2510 Atherosclerotic heart disease of native coronary artery without angina pectoris: Secondary | ICD-10-CM | POA: Diagnosis not present

## 2022-12-02 DIAGNOSIS — D509 Iron deficiency anemia, unspecified: Secondary | ICD-10-CM | POA: Diagnosis not present

## 2022-12-02 DIAGNOSIS — R7303 Prediabetes: Secondary | ICD-10-CM | POA: Diagnosis not present

## 2022-12-02 DIAGNOSIS — R634 Abnormal weight loss: Secondary | ICD-10-CM | POA: Diagnosis not present

## 2022-12-02 DIAGNOSIS — F419 Anxiety disorder, unspecified: Secondary | ICD-10-CM | POA: Diagnosis not present

## 2022-12-02 DIAGNOSIS — E041 Nontoxic single thyroid nodule: Secondary | ICD-10-CM | POA: Diagnosis not present

## 2022-12-02 DIAGNOSIS — I700 Atherosclerosis of aorta: Secondary | ICD-10-CM | POA: Diagnosis not present

## 2022-12-08 DIAGNOSIS — R7303 Prediabetes: Secondary | ICD-10-CM | POA: Diagnosis not present

## 2022-12-08 DIAGNOSIS — E042 Nontoxic multinodular goiter: Secondary | ICD-10-CM | POA: Diagnosis not present

## 2022-12-08 DIAGNOSIS — D352 Benign neoplasm of pituitary gland: Secondary | ICD-10-CM | POA: Diagnosis not present

## 2022-12-08 DIAGNOSIS — E611 Iron deficiency: Secondary | ICD-10-CM | POA: Diagnosis not present

## 2022-12-08 DIAGNOSIS — R634 Abnormal weight loss: Secondary | ICD-10-CM | POA: Diagnosis not present

## 2022-12-15 DIAGNOSIS — E042 Nontoxic multinodular goiter: Secondary | ICD-10-CM | POA: Diagnosis not present

## 2022-12-15 DIAGNOSIS — D352 Benign neoplasm of pituitary gland: Secondary | ICD-10-CM | POA: Diagnosis not present

## 2022-12-15 DIAGNOSIS — E611 Iron deficiency: Secondary | ICD-10-CM | POA: Diagnosis not present

## 2023-01-22 DIAGNOSIS — Z23 Encounter for immunization: Secondary | ICD-10-CM | POA: Diagnosis not present

## 2023-01-24 DIAGNOSIS — E041 Nontoxic single thyroid nodule: Secondary | ICD-10-CM | POA: Diagnosis not present

## 2023-01-24 DIAGNOSIS — I10 Essential (primary) hypertension: Secondary | ICD-10-CM | POA: Diagnosis not present

## 2023-01-24 DIAGNOSIS — I700 Atherosclerosis of aorta: Secondary | ICD-10-CM | POA: Diagnosis not present

## 2023-01-24 DIAGNOSIS — K2950 Unspecified chronic gastritis without bleeding: Secondary | ICD-10-CM | POA: Diagnosis not present

## 2023-01-24 DIAGNOSIS — R634 Abnormal weight loss: Secondary | ICD-10-CM | POA: Diagnosis not present

## 2023-01-24 DIAGNOSIS — E559 Vitamin D deficiency, unspecified: Secondary | ICD-10-CM | POA: Diagnosis not present

## 2023-01-24 DIAGNOSIS — F324 Major depressive disorder, single episode, in partial remission: Secondary | ICD-10-CM | POA: Diagnosis not present

## 2023-01-24 DIAGNOSIS — R7303 Prediabetes: Secondary | ICD-10-CM | POA: Diagnosis not present

## 2023-01-24 DIAGNOSIS — F419 Anxiety disorder, unspecified: Secondary | ICD-10-CM | POA: Diagnosis not present

## 2023-01-24 DIAGNOSIS — D497 Neoplasm of unspecified behavior of endocrine glands and other parts of nervous system: Secondary | ICD-10-CM | POA: Diagnosis not present

## 2023-01-24 DIAGNOSIS — Z0000 Encounter for general adult medical examination without abnormal findings: Secondary | ICD-10-CM | POA: Diagnosis not present

## 2023-01-24 DIAGNOSIS — N183 Chronic kidney disease, stage 3 unspecified: Secondary | ICD-10-CM | POA: Diagnosis not present

## 2023-01-24 DIAGNOSIS — I2510 Atherosclerotic heart disease of native coronary artery without angina pectoris: Secondary | ICD-10-CM | POA: Diagnosis not present

## 2023-01-24 DIAGNOSIS — Z1331 Encounter for screening for depression: Secondary | ICD-10-CM | POA: Diagnosis not present

## 2023-02-09 DIAGNOSIS — J441 Chronic obstructive pulmonary disease with (acute) exacerbation: Secondary | ICD-10-CM | POA: Diagnosis not present

## 2023-02-17 DIAGNOSIS — Z85820 Personal history of malignant melanoma of skin: Secondary | ICD-10-CM | POA: Diagnosis not present

## 2023-02-17 DIAGNOSIS — L570 Actinic keratosis: Secondary | ICD-10-CM | POA: Diagnosis not present

## 2023-02-17 DIAGNOSIS — L814 Other melanin hyperpigmentation: Secondary | ICD-10-CM | POA: Diagnosis not present

## 2023-02-17 DIAGNOSIS — Z08 Encounter for follow-up examination after completed treatment for malignant neoplasm: Secondary | ICD-10-CM | POA: Diagnosis not present

## 2023-02-17 DIAGNOSIS — D225 Melanocytic nevi of trunk: Secondary | ICD-10-CM | POA: Diagnosis not present

## 2023-02-17 DIAGNOSIS — L821 Other seborrheic keratosis: Secondary | ICD-10-CM | POA: Diagnosis not present

## 2023-06-24 DIAGNOSIS — I700 Atherosclerosis of aorta: Secondary | ICD-10-CM | POA: Diagnosis not present

## 2023-06-24 DIAGNOSIS — I5189 Other ill-defined heart diseases: Secondary | ICD-10-CM | POA: Diagnosis not present

## 2023-06-24 DIAGNOSIS — E041 Nontoxic single thyroid nodule: Secondary | ICD-10-CM | POA: Diagnosis not present

## 2023-06-24 DIAGNOSIS — I10 Essential (primary) hypertension: Secondary | ICD-10-CM | POA: Diagnosis not present

## 2023-06-24 DIAGNOSIS — N183 Chronic kidney disease, stage 3 unspecified: Secondary | ICD-10-CM | POA: Diagnosis not present

## 2023-06-24 DIAGNOSIS — F419 Anxiety disorder, unspecified: Secondary | ICD-10-CM | POA: Diagnosis not present

## 2023-06-24 DIAGNOSIS — F330 Major depressive disorder, recurrent, mild: Secondary | ICD-10-CM | POA: Diagnosis not present

## 2023-06-24 DIAGNOSIS — E559 Vitamin D deficiency, unspecified: Secondary | ICD-10-CM | POA: Diagnosis not present

## 2023-06-24 DIAGNOSIS — R7303 Prediabetes: Secondary | ICD-10-CM | POA: Diagnosis not present

## 2023-06-24 DIAGNOSIS — K2950 Unspecified chronic gastritis without bleeding: Secondary | ICD-10-CM | POA: Diagnosis not present

## 2023-06-24 DIAGNOSIS — D497 Neoplasm of unspecified behavior of endocrine glands and other parts of nervous system: Secondary | ICD-10-CM | POA: Diagnosis not present

## 2023-06-24 DIAGNOSIS — I2510 Atherosclerotic heart disease of native coronary artery without angina pectoris: Secondary | ICD-10-CM | POA: Diagnosis not present

## 2023-07-07 ENCOUNTER — Encounter: Payer: Self-pay | Admitting: Cardiology

## 2023-07-07 ENCOUNTER — Ambulatory Visit: Payer: PPO | Attending: Cardiology | Admitting: Cardiology

## 2023-07-07 VITALS — BP 126/82 | HR 58 | Ht 70.5 in | Wt 159.6 lb

## 2023-07-07 DIAGNOSIS — I10 Essential (primary) hypertension: Secondary | ICD-10-CM | POA: Diagnosis not present

## 2023-07-07 DIAGNOSIS — E782 Mixed hyperlipidemia: Secondary | ICD-10-CM

## 2023-07-07 DIAGNOSIS — I2510 Atherosclerotic heart disease of native coronary artery without angina pectoris: Secondary | ICD-10-CM

## 2023-07-07 NOTE — Progress Notes (Signed)
 Cardiology Office Note:  .   Date:  07/07/2023  ID:  Lucas [facility], DOB [birth year], MRN [id number] PCP: Emilio Aspen, MD  [facility] Providers Cardiologist:  Donato Schultz, MD    History of Present Illness: .   Lucas [facility] is a 78 y.o. male Discussed the use of AI scribe software for clinical note transcription with the patient, who gave verbal consent to proceed.  History of Present Illness Lucas [facility] is a 78 year old male with hypotension and coronary artery disease who presents for follow-up. He is accompanied by his wife, Vickie. They are friends with Seth Bake (getting Watchman).  He has a history of hypotension with home blood pressure readings ranging from 106/70 to 148/87, averaging in the 130s. In 2021, he experienced syncope with blood pressure as low as 90/60. Blood pressure tends to be low post-exercise. Previously on amlodipine 5 mg, it was discontinued due to orthostatic symptoms. Currently, he takes valsartan 80 mg in the morning and 160 mg in the evening. He experiences anxiety related to blood pressure fluctuations, which he believes contributes to higher readings.  He has coronary artery disease with coronary plaque noted on CT. A cardiac catheterization on May 31, 2017, showed no significant stenosis. A prior EKG showed sinus bradycardia at 52 bpm, and an echocardiogram in 2022 showed an ejection fraction of 65%, grade 1 diastolic dysfunction, and mild mitral valve regurgitation. He is on Crestor 20 mg daily for hyperlipidemia and aspirin 81 mg daily. Recent labs include LDL of 90, triglycerides of 71, A1c of 5.9, HDL of 64, ALT of 16, and TSH of 1.15.  He has a history of iron deficiency anemia requiring iron infusion. Extensive workup, including colonoscopy and capsule endoscopy, did not reveal a source of bleeding. He is no longer a blood donor due to previous anemia.  He is concerned about his weight, noting difficulty gaining weight despite  eating freely and consuming protein shakes daily. He recalls a period of weight loss and an iron infusion due to low levels, although the source of blood loss was not identified.     No syncope  Studies Reviewed: Marland Kitchen   EKG Interpretation Date/Time:  Thursday July 07 2023 10:09:36 EDT Ventricular Rate:  56 PR Interval:  160 QRS Duration:  92 QT Interval:  396 QTC Calculation: 382 R Axis:   78  Text Interpretation: Sinus bradycardia When compared with ECG of 25-Jul-2019 17:16, QT has shortened Confirmed by Donato Schultz ([id number]) on 07/07/2023 10:17:41 AM    Results LABS LDL: 90 Triglycerides: 71 N0U: 5.9 HDL: 64 ALT: 16 TSH: 1.1  DIAGNOSTIC Cardiac catheterization: No significant stenosis, not obstructed (05/31/2017) EKG: Sinus bradycardia, heart rate 52 bpm Echocardiography: Ejection fraction 65%, grade 1 diastolic dysfunction, mild mitral valve regurgitation (2022) Risk Assessment/Calculations:            Physical Exam:   VS:  BP 126/82   Pulse (!) 58   Ht 5' 10.5" (1.791 m)   Wt 159 lb 9.6 oz (72.4 kg)   SpO2 99%   BMI 22.58 kg/m    Wt Readings from Last 3 Encounters:  07/07/23 159 lb 9.6 oz (72.4 kg)  06/07/22 171 lb (77.6 kg)  01/21/22 172 lb (78 kg)    GEN: Thin,in no acute distress NECK: No JVD; No carotid bruits CARDIAC: RRR, no murmurs, no rubs, no gallops RESPIRATORY:  Clear to auscultation without rales, wheezing or rhonchi  ABDOMEN: Soft, non-tender, non-distended EXTREMITIES:  No  edema; No deformity   ASSESSMENT AND PLAN: .    Assessment and Plan Assessment & Plan Hypertension Blood pressure readings at home range from 106/70 to 148/87, averaging in the 130s. Hypotension and syncope occur post-exercise. Current regimen includes valsartan 80 mg in the morning and 160 mg in the evening. Recent adjustment to valsartan dosage is acceptable. Previous use of amlodipine was discontinued due to orthostatic symptoms. Anxiety contributes to blood pressure  fluctuations. The current valsartan dosage is within the acceptable range, with a maximum dose of 360 mg per day. No need for additional antihypertensive medication at this time. - Continue valsartan 80 mg in the morning and 160 mg in the evening. - Monitor blood pressure regularly at home.  Anxiety Anxiety contributes to fluctuations in blood pressure, with increased anxiety in the mornings exacerbating blood pressure issues. Acknowledging anxiety as a factor is important for management. - Acknowledge and address anxiety as a factor in blood pressure management.  Diastolic dysfunction Echo in 2022 showed grade 1 diastolic dysfunction. No current episodes of diastolic heart failure. [place] Pepper was considered but not deemed necessary due to lack of symptoms such as shortness of breath or diastolic heart failure episodes. - No current need for Jardiance.  Weight loss Experienced weight loss and difficulty gaining weight despite eating freely. Iron deficiency and blood loss were investigated, with normal results including capsule endoscopy. Continues to exercise and maintain a healthy lifestyle. - Consider nutritional supplements like Ensure or protein shakes.         Dispo:   Signed, Donato Schultz, MD

## 2023-07-07 NOTE — Patient Instructions (Signed)

## 2023-08-26 DIAGNOSIS — H25813 Combined forms of age-related cataract, bilateral: Secondary | ICD-10-CM | POA: Diagnosis not present

## 2023-08-26 DIAGNOSIS — H43812 Vitreous degeneration, left eye: Secondary | ICD-10-CM | POA: Diagnosis not present

## 2023-08-30 ENCOUNTER — Other Ambulatory Visit: Payer: Self-pay

## 2023-08-30 ENCOUNTER — Encounter (HOSPITAL_BASED_OUTPATIENT_CLINIC_OR_DEPARTMENT_OTHER): Payer: Self-pay | Admitting: Emergency Medicine

## 2023-08-30 ENCOUNTER — Emergency Department (HOSPITAL_BASED_OUTPATIENT_CLINIC_OR_DEPARTMENT_OTHER)
Admission: EM | Admit: 2023-08-30 | Discharge: 2023-08-31 | Disposition: A | Discharge status: Home / Self Care | Attending: Emergency Medicine | Admitting: Emergency Medicine

## 2023-08-30 ENCOUNTER — Emergency Department (HOSPITAL_BASED_OUTPATIENT_CLINIC_OR_DEPARTMENT_OTHER)

## 2023-08-30 DIAGNOSIS — I6782 Cerebral ischemia: Secondary | ICD-10-CM | POA: Diagnosis not present

## 2023-08-30 DIAGNOSIS — Z79899 Other long term (current) drug therapy: Secondary | ICD-10-CM | POA: Diagnosis not present

## 2023-08-30 DIAGNOSIS — M79602 Pain in left arm: Secondary | ICD-10-CM | POA: Diagnosis not present

## 2023-08-30 DIAGNOSIS — M79601 Pain in right arm: Secondary | ICD-10-CM | POA: Diagnosis present

## 2023-08-30 DIAGNOSIS — R519 Headache, unspecified: Secondary | ICD-10-CM | POA: Insufficient documentation

## 2023-08-30 DIAGNOSIS — I10 Essential (primary) hypertension: Secondary | ICD-10-CM | POA: Diagnosis not present

## 2023-08-30 DIAGNOSIS — Z7982 Long term (current) use of aspirin: Secondary | ICD-10-CM | POA: Insufficient documentation

## 2023-08-30 LAB — DIFFERENTIAL
Abs Immature Granulocytes: 0.02 10*3/uL (ref 0.00–0.07)
Basophils Absolute: 0 10*3/uL (ref 0.0–0.1)
Basophils Relative: 1 %
Eosinophils Absolute: 0.1 10*3/uL (ref 0.0–0.5)
Eosinophils Relative: 1 %
Immature Granulocytes: 0 %
Lymphocytes Relative: 27 %
Lymphs Abs: 2.2 10*3/uL (ref 0.7–4.0)
Monocytes Absolute: 0.7 10*3/uL (ref 0.1–1.0)
Monocytes Relative: 8 %
Neutro Abs: 5.2 10*3/uL (ref 1.7–7.7)
Neutrophils Relative %: 63 %

## 2023-08-30 LAB — CBC
HCT: 44.9 % (ref 39.0–52.0)
Hemoglobin: 15.5 g/dL (ref 13.0–17.0)
MCH: 30.9 pg (ref 26.0–34.0)
MCHC: 34.5 g/dL (ref 30.0–36.0)
MCV: 89.6 fL (ref 80.0–100.0)
Platelets: 228 10*3/uL (ref 150–400)
RBC: 5.01 MIL/uL (ref 4.22–5.81)
RDW: 12.5 % (ref 11.5–15.5)
WBC: 8.2 10*3/uL (ref 4.0–10.5)
nRBC: 0 % (ref 0.0–0.2)

## 2023-08-30 LAB — CBG MONITORING, ED: Glucose-Capillary: 105 mg/dL — ABNORMAL HIGH (ref 70–99)

## 2023-08-30 MED ORDER — SODIUM CHLORIDE 0.9% FLUSH
3.0000 mL | Freq: Once | INTRAVENOUS | Status: AC
  Administered 2023-08-30: 3 mL via INTRAVENOUS

## 2023-08-30 NOTE — ED Notes (Signed)
 Patient transported to CT

## 2023-08-30 NOTE — ED Triage Notes (Addendum)
 Pt via POV c/o elevated BP (183/102 at home) with lightheadedness and numbness and tingling to left arm and bilateral hands. Denies blurry vision. No changes to speech per pt and family. Noticed symptoms at 9pm this evening after taking BP and anxiety medications. NIHSS 1

## 2023-08-31 DIAGNOSIS — M79601 Pain in right arm: Secondary | ICD-10-CM | POA: Diagnosis not present

## 2023-08-31 LAB — COMPREHENSIVE METABOLIC PANEL WITH GFR
ALT: 30 U/L (ref 0–44)
AST: 29 U/L (ref 15–41)
Albumin: 4.3 g/dL (ref 3.5–5.0)
Alkaline Phosphatase: 111 U/L (ref 38–126)
Anion gap: 15 (ref 5–15)
BUN: 14 mg/dL (ref 8–23)
CO2: 21 mmol/L — ABNORMAL LOW (ref 22–32)
Calcium: 9.6 mg/dL (ref 8.9–10.3)
Chloride: 101 mmol/L (ref 98–111)
Creatinine, Ser: 0.96 mg/dL (ref 0.61–1.24)
GFR, Estimated: >60 mL/min (ref >60)
Glucose, Bld: 96 mg/dL (ref 70–99)
Potassium: 3.9 mmol/L (ref 3.5–5.1)
Sodium: 137 mmol/L (ref 135–145)
Total Bilirubin: 0.6 mg/dL (ref 0.0–1.2)
Total Protein: 6.4 g/dL — ABNORMAL LOW (ref 6.5–8.1)

## 2023-08-31 LAB — ETHANOL: Alcohol, Ethyl (B): <15 mg/dL (ref <15)

## 2023-08-31 NOTE — Discharge Instructions (Addendum)
 Continue medications as previously prescribed.  Return to the emergency department if your symptoms significantly worsen or change.

## 2023-08-31 NOTE — ED Provider Notes (Signed)
 [place] EMERGENCY DEPARTMENT AT [facility] Provider Note   CSN: [id number] Arrival date & time: 08/30/23  2246     History  Chief Complaint  Patient presents with   Hypertension   Numbness    Lucas Hardy is a 78 y.o. male.  Patient is a 78 year old male with past medical history of hyperlipidemia, BPH, restless legs.  Patient presenting today for evaluation of tingling of both arms and feeling generally unwell.  This episode occurred earlier this evening at approximately 9 PM.  He describes a tingling sensation to both arms which has since resolved.  He denies any weakness or numbness.  He denies any headache or visual disturbance.       Home Medications Prior to Admission medications   Medication Sig Start Date End Date Taking? Authorizing Provider  acetaminophen  (TYLENOL ) 500 MG tablet Take 1,000 mg by mouth daily as needed for fever. Patient not taking: Reported on 07/07/2023    [provider]  Apoaequorin (PREVAGEN) 10 MG CAPS Take 10 mg by mouth daily.    [provider]  aspirin  EC 81 MG tablet Take 81 mg by mouth daily. Swallow whole.    [provider]  busPIRone (BUSPAR) 5 MG tablet Take 2.5 mg by mouth 2 (two) times daily. 2.5 mg in the morning and 5 mg at night    [provider]  Cholecalciferol 2000 units CAPS Take 2,000 Units by mouth daily. Patient not taking: Reported on 07/07/2023    [provider]  FLUoxetine  (PROZAC ) 40 MG capsule Take 40 mg by mouth every morning. Patient not taking: Reported on 07/07/2023 12/07/17   [provider]  loratadine (CLARITIN) 10 MG tablet Take 10 mg by mouth daily.    [provider]  Multiple Vitamin (MULTIVITAMIN WITH MINERALS) TABS tablet Take 1 tablet by mouth daily.    [provider]  pantoprazole  (PROTONIX ) 40 MG tablet Take 40 mg by mouth every other day. Patient not taking: Reported on 07/07/2023 01/14/22   [provider]   rosuvastatin  (CRESTOR ) 20 MG tablet Take 20 mg by mouth every morning.     [provider]  traZODone (DESYREL) 50 MG tablet Take 25-50 mg by mouth at bedtime. 03/04/21   [provider]  valsartan (DIOVAN) 80 MG tablet Take 80 mg by mouth 2 (two) times daily. 80 mg in the morning and 160 mg at night 01/10/20   [provider]      Allergies    Atorvastatin, Effexor [venlafaxine], and Ciprofloxacin     Review of Systems   Review of Systems  All other systems reviewed and are negative.   Physical Exam Updated Vital Signs BP (!) 143/80   Pulse (!) 51   Temp 98 F (36.7 C)   Resp 17   Ht 5\' 10"  (1.778 m)   Wt 73.5 kg   SpO2 95%   BMI 23.24 kg/m  Physical Exam Vitals and nursing note reviewed.  Constitutional:      General: He is not in acute distress.    Appearance: He is well-developed. He is not diaphoretic.  HENT:     Head: Normocephalic and atraumatic.  Eyes:     Extraocular Movements: Extraocular movements intact.     Pupils: Pupils are equal, round, and reactive to light.  Cardiovascular:     Rate and Rhythm: Normal rate and regular rhythm.     Heart sounds: No murmur heard.    No friction rub.  Pulmonary:  Effort: Pulmonary effort is normal. No respiratory distress.     Breath sounds: Normal breath sounds. No wheezing or rales.  Abdominal:     General: Bowel sounds are normal. There is no distension.     Palpations: Abdomen is soft.     Tenderness: There is no abdominal tenderness.  Musculoskeletal:        General: Normal range of motion.     Cervical back: Normal range of motion and neck supple.  Skin:    General: Skin is warm and dry.  Neurological:     General: No focal deficit present.     Mental Status: He is alert and oriented to person, place, and time.     Cranial Nerves: No cranial nerve deficit.     Motor: No weakness.     Coordination: Coordination normal.     ED Results / Procedures / Treatments   Labs (all  labs ordered are listed, but only abnormal results are displayed) Labs Reviewed  COMPREHENSIVE METABOLIC PANEL WITH GFR - Abnormal; Notable for the following components:      Result Value   CO2 21 (*)    Total Protein 6.4 (*)    All other components within normal limits  CBG MONITORING, ED - Abnormal; Notable for the following components:   Glucose-Capillary 105 (*)    All other components within normal limits  CBC  DIFFERENTIAL  ETHANOL    EKG None  Radiology CT HEAD WO CONTRAST Result Date: 08/30/2023 CLINICAL DATA:  Numbness tingling elevated BP EXAM: CT HEAD WITHOUT CONTRAST TECHNIQUE: Contiguous axial images were obtained from the base of the skull through the vertex without intravenous contrast. RADIATION DOSE REDUCTION: This exam was performed according to the departmental dose-optimization program which includes automated exposure control, adjustment of the mA and/or kV according to patient size and/or use of iterative reconstruction technique. COMPARISON:  Head CT 07/31/2019 FINDINGS: Brain: No acute territorial infarction, hemorrhage or intracranial mass. Minimal atrophy and chronic small vessel ischemic changes of the white matter. The ventricles are nonenlarged. 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 Vascular: No hyperdense vessels.  No unexpected calcification Skull: Normal. Negative for fracture or focal lesion. Sinuses/Orbits: No acute finding. Other: None IMPRESSION: 1. No CT evidence for acute intracranial abnormality. 2. Minimal atrophy and chronic small vessel ischemic changes of the white matter. 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 Electronically Signed   By: Esmeralda Hedge M.D.   On: 08/30/2023 23:26    Procedures Procedures    Medications Ordered in ED Medications  sodium chloride  flush (NS) 0.9 % injection 3 mL (3 mLs Intravenous Given 08/30/23 2316)    ED Course/ Medical Decision Making/ A&P  Patient is a  78 year old male presenting with tingling both arms as described in the HPI.  Patient arrives here with stable vital signs and is afebrile.  Physical examination is unremarkable and he is neurologically intact.  Laboratory studies obtained including CBC, basic metabolic panel, both of which are unremarkable.  CT scan of the head negative for acute intracranial process.  Patient's symptoms have resolved and he is back to baseline.  At this point, I feel as though discharge is appropriate.  Cause of his symptoms unclear, but not consistent with stroke.  He does state that his blood pressure was somewhat elevated at home during this episode, but is now 143/80.  Final Clinical Impression(s) / ED Diagnoses Final diagnoses:  None    Rx / DC Orders ED  Discharge Orders     None         Orvilla Blander, MD 08/31/23 [phone]

## 2023-09-15 DIAGNOSIS — H2512 Age-related nuclear cataract, left eye: Secondary | ICD-10-CM | POA: Diagnosis not present

## 2023-10-03 ENCOUNTER — Other Ambulatory Visit: Payer: Self-pay | Admitting: Internal Medicine

## 2023-10-03 DIAGNOSIS — N183 Chronic kidney disease, stage 3 unspecified: Secondary | ICD-10-CM | POA: Diagnosis not present

## 2023-10-03 DIAGNOSIS — I10 Essential (primary) hypertension: Secondary | ICD-10-CM | POA: Diagnosis not present

## 2023-10-03 DIAGNOSIS — F419 Anxiety disorder, unspecified: Secondary | ICD-10-CM | POA: Diagnosis not present

## 2023-10-03 DIAGNOSIS — I5189 Other ill-defined heart diseases: Secondary | ICD-10-CM | POA: Diagnosis not present

## 2023-10-03 DIAGNOSIS — R053 Chronic cough: Secondary | ICD-10-CM | POA: Diagnosis not present

## 2023-10-03 DIAGNOSIS — F330 Major depressive disorder, recurrent, mild: Secondary | ICD-10-CM | POA: Diagnosis not present

## 2023-10-03 DIAGNOSIS — I2510 Atherosclerotic heart disease of native coronary artery without angina pectoris: Secondary | ICD-10-CM | POA: Diagnosis not present

## 2023-10-03 DIAGNOSIS — R911 Solitary pulmonary nodule: Secondary | ICD-10-CM

## 2023-10-05 ENCOUNTER — Ambulatory Visit
Admission: RE | Admit: 2023-10-05 | Discharge: 2023-10-05 | Disposition: A | Discharge status: Home / Self Care | Source: Ambulatory Visit | Attending: Internal Medicine | Admitting: Internal Medicine

## 2023-10-05 DIAGNOSIS — R053 Chronic cough: Secondary | ICD-10-CM

## 2023-10-05 DIAGNOSIS — R911 Solitary pulmonary nodule: Secondary | ICD-10-CM

## 2023-10-05 DIAGNOSIS — R918 Other nonspecific abnormal finding of lung field: Secondary | ICD-10-CM | POA: Diagnosis not present

## 2023-10-06 DIAGNOSIS — H2511 Age-related nuclear cataract, right eye: Secondary | ICD-10-CM | POA: Diagnosis not present

## 2023-10-21 DIAGNOSIS — Z974 Presence of external hearing-aid: Secondary | ICD-10-CM | POA: Diagnosis not present

## 2023-10-21 DIAGNOSIS — R2681 Unsteadiness on feet: Secondary | ICD-10-CM | POA: Diagnosis not present

## 2023-10-21 DIAGNOSIS — H6123 Impacted cerumen, bilateral: Secondary | ICD-10-CM | POA: Diagnosis not present

## 2023-10-21 DIAGNOSIS — Z461 Encounter for fitting and adjustment of hearing aid: Secondary | ICD-10-CM | POA: Diagnosis not present

## 2023-10-21 DIAGNOSIS — H906 Mixed conductive and sensorineural hearing loss, bilateral: Secondary | ICD-10-CM | POA: Diagnosis not present

## 2023-10-21 DIAGNOSIS — H903 Sensorineural hearing loss, bilateral: Secondary | ICD-10-CM | POA: Diagnosis not present

## 2023-10-21 DIAGNOSIS — Z7982 Long term (current) use of aspirin: Secondary | ICD-10-CM | POA: Diagnosis not present

## 2023-10-21 DIAGNOSIS — Z570 Occupational exposure to noise: Secondary | ICD-10-CM | POA: Diagnosis not present

## 2023-10-21 DIAGNOSIS — Z79899 Other long term (current) drug therapy: Secondary | ICD-10-CM | POA: Diagnosis not present

## 2023-10-21 DIAGNOSIS — H9313 Tinnitus, bilateral: Secondary | ICD-10-CM | POA: Diagnosis not present

## 2023-10-21 DIAGNOSIS — R42 Dizziness and giddiness: Secondary | ICD-10-CM | POA: Diagnosis not present

## 2023-10-21 DIAGNOSIS — H8023 Cochlear otosclerosis, bilateral: Secondary | ICD-10-CM | POA: Diagnosis not present

## 2023-10-28 DIAGNOSIS — H903 Sensorineural hearing loss, bilateral: Secondary | ICD-10-CM | POA: Diagnosis not present

## 2023-11-29 DIAGNOSIS — I10 Essential (primary) hypertension: Secondary | ICD-10-CM | POA: Diagnosis not present

## 2023-11-29 DIAGNOSIS — F330 Major depressive disorder, recurrent, mild: Secondary | ICD-10-CM | POA: Diagnosis not present

## 2023-11-29 DIAGNOSIS — N183 Chronic kidney disease, stage 3 unspecified: Secondary | ICD-10-CM | POA: Diagnosis not present

## 2023-11-29 DIAGNOSIS — F419 Anxiety disorder, unspecified: Secondary | ICD-10-CM | POA: Diagnosis not present

## 2023-12-15 DIAGNOSIS — I10 Essential (primary) hypertension: Secondary | ICD-10-CM | POA: Diagnosis not present

## 2024-01-26 DIAGNOSIS — D497 Neoplasm of unspecified behavior of endocrine glands and other parts of nervous system: Secondary | ICD-10-CM | POA: Diagnosis not present

## 2024-01-26 DIAGNOSIS — R911 Solitary pulmonary nodule: Secondary | ICD-10-CM | POA: Diagnosis not present

## 2024-01-26 DIAGNOSIS — I5189 Other ill-defined heart diseases: Secondary | ICD-10-CM | POA: Diagnosis not present

## 2024-01-26 DIAGNOSIS — Z79899 Other long term (current) drug therapy: Secondary | ICD-10-CM | POA: Diagnosis not present

## 2024-01-26 DIAGNOSIS — K9089 Other intestinal malabsorption: Secondary | ICD-10-CM | POA: Diagnosis not present

## 2024-01-26 DIAGNOSIS — Z1331 Encounter for screening for depression: Secondary | ICD-10-CM | POA: Diagnosis not present

## 2024-01-26 DIAGNOSIS — Z125 Encounter for screening for malignant neoplasm of prostate: Secondary | ICD-10-CM | POA: Diagnosis not present

## 2024-01-26 DIAGNOSIS — I10 Essential (primary) hypertension: Secondary | ICD-10-CM | POA: Diagnosis not present

## 2024-01-26 DIAGNOSIS — Z23 Encounter for immunization: Secondary | ICD-10-CM | POA: Diagnosis not present

## 2024-01-26 DIAGNOSIS — F419 Anxiety disorder, unspecified: Secondary | ICD-10-CM | POA: Diagnosis not present

## 2024-01-26 DIAGNOSIS — N183 Chronic kidney disease, stage 3 unspecified: Secondary | ICD-10-CM | POA: Diagnosis not present

## 2024-01-26 DIAGNOSIS — Z136 Encounter for screening for cardiovascular disorders: Secondary | ICD-10-CM | POA: Diagnosis not present

## 2024-01-26 DIAGNOSIS — E559 Vitamin D deficiency, unspecified: Secondary | ICD-10-CM | POA: Diagnosis not present

## 2024-01-26 DIAGNOSIS — Z0000 Encounter for general adult medical examination without abnormal findings: Secondary | ICD-10-CM | POA: Diagnosis not present

## 2024-01-26 DIAGNOSIS — R7303 Prediabetes: Secondary | ICD-10-CM | POA: Diagnosis not present

## 2024-01-26 DIAGNOSIS — I2510 Atherosclerotic heart disease of native coronary artery without angina pectoris: Secondary | ICD-10-CM | POA: Diagnosis not present

## 2024-01-26 DIAGNOSIS — F330 Major depressive disorder, recurrent, mild: Secondary | ICD-10-CM | POA: Diagnosis not present

## 2024-01-26 DIAGNOSIS — E041 Nontoxic single thyroid nodule: Secondary | ICD-10-CM | POA: Diagnosis not present

## 2024-02-01 DIAGNOSIS — H43812 Vitreous degeneration, left eye: Secondary | ICD-10-CM | POA: Diagnosis not present

## 2024-02-01 DIAGNOSIS — Z961 Presence of intraocular lens: Secondary | ICD-10-CM | POA: Diagnosis not present

## 2024-02-07 ENCOUNTER — Emergency Department (HOSPITAL_BASED_OUTPATIENT_CLINIC_OR_DEPARTMENT_OTHER)
Admission: EM | Admit: 2024-02-07 | Discharge: 2024-02-07 | Disposition: A | Discharge status: Home / Self Care | Attending: Emergency Medicine | Admitting: Emergency Medicine

## 2024-02-07 ENCOUNTER — Other Ambulatory Visit: Payer: Self-pay

## 2024-02-07 ENCOUNTER — Emergency Department (HOSPITAL_BASED_OUTPATIENT_CLINIC_OR_DEPARTMENT_OTHER)

## 2024-02-07 ENCOUNTER — Encounter (HOSPITAL_BASED_OUTPATIENT_CLINIC_OR_DEPARTMENT_OTHER): Payer: Self-pay

## 2024-02-07 DIAGNOSIS — K219 Gastro-esophageal reflux disease without esophagitis: Secondary | ICD-10-CM | POA: Insufficient documentation

## 2024-02-07 DIAGNOSIS — R1013 Epigastric pain: Secondary | ICD-10-CM

## 2024-02-07 DIAGNOSIS — K5900 Constipation, unspecified: Secondary | ICD-10-CM | POA: Diagnosis not present

## 2024-02-07 DIAGNOSIS — R748 Abnormal levels of other serum enzymes: Secondary | ICD-10-CM | POA: Insufficient documentation

## 2024-02-07 DIAGNOSIS — R1033 Periumbilical pain: Secondary | ICD-10-CM | POA: Diagnosis present

## 2024-02-07 DIAGNOSIS — R109 Unspecified abdominal pain: Secondary | ICD-10-CM | POA: Diagnosis not present

## 2024-02-07 DIAGNOSIS — N189 Chronic kidney disease, unspecified: Secondary | ICD-10-CM | POA: Insufficient documentation

## 2024-02-07 DIAGNOSIS — E871 Hypo-osmolality and hyponatremia: Secondary | ICD-10-CM | POA: Diagnosis not present

## 2024-02-07 DIAGNOSIS — Z7982 Long term (current) use of aspirin: Secondary | ICD-10-CM | POA: Insufficient documentation

## 2024-02-07 DIAGNOSIS — I2510 Atherosclerotic heart disease of native coronary artery without angina pectoris: Secondary | ICD-10-CM | POA: Diagnosis not present

## 2024-02-07 DIAGNOSIS — Z79899 Other long term (current) drug therapy: Secondary | ICD-10-CM | POA: Diagnosis not present

## 2024-02-07 DIAGNOSIS — I129 Hypertensive chronic kidney disease with stage 1 through stage 4 chronic kidney disease, or unspecified chronic kidney disease: Secondary | ICD-10-CM | POA: Diagnosis not present

## 2024-02-07 DIAGNOSIS — R300 Dysuria: Secondary | ICD-10-CM | POA: Diagnosis not present

## 2024-02-07 DIAGNOSIS — N281 Cyst of kidney, acquired: Secondary | ICD-10-CM | POA: Diagnosis not present

## 2024-02-07 DIAGNOSIS — R932 Abnormal findings on diagnostic imaging of liver and biliary tract: Secondary | ICD-10-CM | POA: Diagnosis not present

## 2024-02-07 LAB — COMPREHENSIVE METABOLIC PANEL WITH GFR
ALT: 22 U/L (ref 0–44)
AST: 33 U/L (ref 15–41)
Albumin: 4.4 g/dL (ref 3.5–5.0)
Alkaline Phosphatase: 85 U/L (ref 38–126)
Anion gap: 10 (ref 5–15)
BUN: 22 mg/dL (ref 8–23)
CO2: 25 mmol/L (ref 22–32)
Calcium: 9.9 mg/dL (ref 8.9–10.3)
Chloride: 98 mmol/L (ref 98–111)
Creatinine, Ser: 1.11 mg/dL (ref 0.61–1.24)
GFR, Estimated: >60 mL/min (ref >60)
Glucose, Bld: 96 mg/dL (ref 70–99)
Potassium: 4.8 mmol/L (ref 3.5–5.1)
Sodium: 133 mmol/L — ABNORMAL LOW (ref 135–145)
Total Bilirubin: 1.2 mg/dL (ref 0.0–1.2)
Total Protein: 6.6 g/dL (ref 6.5–8.1)

## 2024-02-07 LAB — CBC
HCT: 42.5 % (ref 39.0–52.0)
Hemoglobin: 14.7 g/dL (ref 13.0–17.0)
MCH: 31.5 pg (ref 26.0–34.0)
MCHC: 34.6 g/dL (ref 30.0–36.0)
MCV: 91 fL (ref 80.0–100.0)
Platelets: 227 K/uL (ref 150–400)
RBC: 4.67 MIL/uL (ref 4.22–5.81)
RDW: 12.3 % (ref 11.5–15.5)
WBC: 7.1 K/uL (ref 4.0–10.5)
nRBC: 0 % (ref 0.0–0.2)

## 2024-02-07 LAB — URINALYSIS, ROUTINE W REFLEX MICROSCOPIC
Bilirubin Urine: NEGATIVE
Glucose, UA: NEGATIVE mg/dL
Hgb urine dipstick: NEGATIVE
Ketones, ur: NEGATIVE mg/dL
Leukocytes,Ua: NEGATIVE
Nitrite: NEGATIVE
Protein, ur: NEGATIVE mg/dL
Specific Gravity, Urine: 1.016 (ref 1.005–1.030)
pH: 6.5 (ref 5.0–8.0)

## 2024-02-07 LAB — LIPASE, BLOOD: Lipase: 62 U/L — ABNORMAL HIGH (ref 11–51)

## 2024-02-07 MED ORDER — MORPHINE SULFATE (PF) 4 MG/ML IV SOLN
4.0000 mg | Freq: Once | INTRAVENOUS | Status: DC
Start: 2024-02-07 — End: 2024-02-07
  Filled 2024-02-07: qty 1

## 2024-02-07 MED ORDER — ONDANSETRON HCL 4 MG/2ML IJ SOLN
4.0000 mg | Freq: Once | INTRAMUSCULAR | Status: AC
  Administered 2024-02-07: 4 mg via INTRAVENOUS
  Filled 2024-02-07: qty 2

## 2024-02-07 MED ORDER — MORPHINE SULFATE (PF) 2 MG/ML IV SOLN
2.0000 mg | Freq: Once | INTRAVENOUS | Status: AC
  Administered 2024-02-07: 2 mg via INTRAVENOUS
  Filled 2024-02-07: qty 1

## 2024-02-07 MED ORDER — ONDANSETRON 4 MG PO TBDP: 4.0000 mg | ORAL_TABLET | Freq: Three times a day (TID) | ORAL | 0 refills | Status: AC | PRN

## 2024-02-07 MED ORDER — PANTOPRAZOLE SODIUM 40 MG PO TBEC: 40.0000 mg | DELAYED_RELEASE_TABLET | Freq: Every day | ORAL | 0 refills | Status: AC

## 2024-02-07 MED ORDER — IOHEXOL 300 MG/ML  SOLN
100.0000 mL | Freq: Once | INTRAMUSCULAR | Status: AC | PRN
  Administered 2024-02-07: 85 mL via INTRAVENOUS

## 2024-02-07 NOTE — Discharge Instructions (Addendum)
 As discussed, please follow up with your primary care provider and GI provider. Seek emergency care if experiencing any new or worsening symptoms.

## 2024-02-07 NOTE — ED Provider Notes (Signed)
 [place] EMERGENCY DEPARTMENT AT [facility] Provider Note   CSN: [id number] Arrival date & time: 02/07/24  9090     Patient presents with: Abdominal Pain   Lucas Hardy is a 78 y.o. male with PMHx OA, CKD, CAD, GERD, HTN, who presents to ED concerned for abdominal pain.  Pain appears to be just above the umbilicus.  Patient stating that this abdominal pain has been occurring for months, but has been becoming severely worse over the past couple days.  Pain is worse after eating.  Patient endorses metal taste in mouth and increased belching which resolved with Zofran  given by triage provider.  Patient has been taking PPI and Tums at home without symptom improvement.  Family ember at bedside state that patient has also been losing weight over the past couple of years.  Patient also endorsing difficulties with constipation - last BM yesterday was nonbloody.  Patient took Tylenol  yesterday without much relief.  Patient also endorsing some dysuria recently.  Denies fever, vomiting, diarrhea, hematuria, hematochezia.     Abdominal Pain      Prior to Admission medications   Medication Sig Start Date End Date Taking? Authorizing Provider  ALPRAZolam (XANAX) 0.25 MG tablet Take 0.25-0.5 mg by mouth as needed. 10/03/23  Yes [provider]  ondansetron  (ZOFRAN -ODT) 4 MG disintegrating tablet Take 1 tablet (4 mg total) by mouth every 8 (eight) hours as needed for nausea. 02/07/24  Yes Hoy Fraction F, PA-C  pantoprazole  (PROTONIX ) 40 MG tablet Take 1 tablet (40 mg total) by mouth daily. 02/07/24 03/08/24 Yes Windie Marasco, Fraction F, PA-C  sertraline (ZOLOFT) 50 MG tablet Take 50 mg by mouth daily. 10/03/23  Yes [provider]  Apoaequorin (PREVAGEN) 10 MG CAPS Take 10 mg by mouth daily.    [provider]  aspirin  EC 81 MG tablet Take 81 mg by mouth daily. Swallow whole.    [provider]  busPIRone (BUSPAR) 5 MG tablet Take 2.5 mg by mouth 2 (two)  times daily. 2.5 mg in the morning and 5 mg at night    [provider]  loratadine (CLARITIN) 10 MG tablet Take 10 mg by mouth daily.    [provider]  Multiple Vitamin (MULTIVITAMIN WITH MINERALS) TABS tablet Take 1 tablet by mouth daily.    [provider]  rosuvastatin  (CRESTOR ) 20 MG tablet Take 20 mg by mouth every morning.     [provider]  traZODone (DESYREL) 50 MG tablet Take 25-50 mg by mouth at bedtime. 03/04/21   [provider]  valsartan (DIOVAN) 80 MG tablet Take 80 mg by mouth 2 (two) times daily. 80 mg in the morning and 160 mg at night 01/10/20   [provider]    Allergies: Atorvastatin, Effexor [venlafaxine], and Ciprofloxacin     Review of Systems  Gastrointestinal:  Positive for abdominal pain.    Updated Vital Signs BP (!) 157/90   Pulse (!) 54   Temp 98.1 F (36.7 C) (Oral)   Resp 16   SpO2 98%   Physical Exam Vitals and nursing note reviewed.  Constitutional:      General: He is not in acute distress.    Appearance: He is not ill-appearing or toxic-appearing.  HENT:     Head: Normocephalic and atraumatic.     Mouth/Throat:     Mouth: Mucous membranes are moist.     Pharynx: No posterior oropharyngeal erythema.  Eyes:     General: No scleral icterus.  Right eye: No discharge.        Left eye: No discharge.     Conjunctiva/sclera: Conjunctivae normal.  Cardiovascular:     Rate and Rhythm: Normal rate and regular rhythm.     Pulses: Normal pulses.     Heart sounds: Normal heart sounds. No murmur heard. Pulmonary:     Effort: Pulmonary effort is normal. No respiratory distress.     Breath sounds: Normal breath sounds. No wheezing, rhonchi or rales.  Abdominal:     General: Abdomen is flat. Bowel sounds are normal. There is no distension.     Palpations: Abdomen is soft. There is no mass.     Tenderness: There is abdominal tenderness in the periumbilical area.     Comments: Tenderness  just above umbilicus  Musculoskeletal:     Right lower leg: No edema.     Left lower leg: No edema.  Skin:    General: Skin is warm and dry.     Findings: No rash.  Neurological:     General: No focal deficit present.     Mental Status: He is alert and oriented to person, place, and time. Mental status is at baseline.  Psychiatric:        Mood and Affect: Mood normal.        Behavior: Behavior normal.     (all labs ordered are listed, but only abnormal results are displayed) Labs Reviewed  LIPASE, BLOOD - Abnormal; Notable for the following components:      Result Value   Lipase 62 (*)    All other components within normal limits  COMPREHENSIVE METABOLIC PANEL WITH GFR - Abnormal; Notable for the following components:   Sodium 133 (*)    All other components within normal limits  CBC  URINALYSIS, ROUTINE W REFLEX MICROSCOPIC    EKG: EKG Interpretation Date/Time:  Tuesday February 07 2024 13:31:09 EST Ventricular Rate:  55 PR Interval:  165 QRS Duration:  105 QT Interval:  453 QTC Calculation: 434 R Axis:   87  Text Interpretation: Sinus rhythm Borderline right axis deviation Left ventricular hypertrophy since last tracing no significant change Confirmed by Lenor Hollering [phone]) on 02/07/2024 2:00:44 PM  Radiology: CT ABDOMEN PELVIS W CONTRAST Result Date: 02/07/2024 CLINICAL DATA:  Abdominal pain increasing over the past few days. EXAM: CT ABDOMEN AND PELVIS WITH CONTRAST TECHNIQUE: Multidetector CT imaging of the abdomen and pelvis was performed using the standard protocol following bolus administration of intravenous contrast. RADIATION DOSE REDUCTION: This exam was performed according to the departmental dose-optimization program which includes automated exposure control, adjustment of the mA and/or kV according to patient size and/or use of iterative reconstruction technique. CONTRAST:  85mL OMNIPAQUE  IOHEXOL  300 MG/ML  SOLN COMPARISON:  10/16/2021 FINDINGS: Lower  chest: Heart is normal size. Minimal calcified plaque over the descending thoracic aorta. Visualized lung bases are clear. Hepatobiliary: Liver demonstrates a few subcentimeter hypodensities too small to characterize but likely cysts and unchanged. Hyperdense subcentimeter focus over the right lobe just below the diaphragm likely focal arteriovenous shunting. Gallbladder and biliary tree are normal. Pancreas: Normal. Spleen: Normal. Adrenals/Urinary Tract: Adrenal glands are normal. Kidneys are normal in size without hydronephrosis or nephrolithiasis. Multiple bilateral renal cysts with the largest over the mid to upper pole right kidney measuring 10.8 cm unchanged. Ureters and bladder are unremarkable. Stomach/Bowel: Stomach is within normal. There are a few small bowel loops over the left upper abdomen at the upper limits of normal in diameter as remainder  of the small bowel is normal. Appendix is normal. Colon is otherwise unremarkable. There is mild fecal retention over the rectum. Vascular/Lymphatic: Mild calcified plaque over the abdominal aorta which is normal in caliber. Remaining vascular structures are unremarkable. No significant adenopathy. Reproductive: Prior prostatectomy. Other: No significant free fluid or free peritoneal air. No focal inflammatory change. Musculoskeletal: Stable moderate T12 compression fracture. IMPRESSION: 1. No acute findings in the abdomen/pelvis. 2. Multiple bilateral renal cysts with the largest over the mid to upper pole right kidney measuring 10.8 cm unchanged. 3. Aortic atherosclerosis. 4. Stable moderate T12 compression fracture. Aortic Atherosclerosis (ICD10-I70.0). Electronically Signed   By: Toribio Agreste M.D.   On: 02/07/2024 12:50     Procedures   Medications Ordered in the ED  ondansetron  (ZOFRAN ) injection 4 mg (4 mg Intravenous Given 02/07/24 0929)  ondansetron  (ZOFRAN ) injection 4 mg (4 mg Intravenous Given 02/07/24 1104)  morphine  (PF) 2 MG/ML injection 2  mg (2 mg Intravenous Given 02/07/24 1106)  iohexol  (OMNIPAQUE ) 300 MG/ML solution 100 mL (85 mLs Intravenous Contrast Given 02/07/24 1132)                                    Medical Decision Making Amount and/or Complexity of Data Reviewed Labs: ordered. Radiology: ordered.  Risk Prescription drug management.    This patient presents to the ED for concern of abdominal pain, this involves an extensive number of treatment options, and is a complaint that carries with it a high risk of complications and morbidity.  The differential diagnosis includes gastroenteritis, colitis, small bowel obstruction, appendicitis, cholecystitis, pancreatitis, nephrolithiasis, UTI, pyelonephritis   Co morbidities that complicate the patient evaluation  OA, CKD, CAD, GERD, HTN,   Additional history obtained:  Dr. Charlott PCP   Problem List / ED Course / Critical interventions / Medication management  Patient presented for abdominal pain that has been present for months but increasing in severity over the past couple days.  Patient also endorsing dysuria and difficulties with constipation.  Last BM yesterday.  Physical exam with mild tenderness just above umbilicus.  Rest of physical exam reassuring.  Patient afebrile with stable vitals. I Ordered, and personally interpreted labs.  UA not concerning for infection.  CBC without leukocytosis or anemia.  CMP with mild hyponatremia at 133.  Lipase mildly elevated at 62.  EKG sinus rhythm. I ordered imaging studies including CT Abd/Pelvis with contrast: evaluate for structural/surgical etiology of patients' severe abdominal pain. I independently visualized and interpreted imaging and I agree with the radiologist interpretation of acute process.  There were incidental renal cyst which patient states that he followed up with a different outpatient prior years ago. Provided patient with Zofran  and 2 mg morphine .  Patient feeling a lot better.  Overall, given  upper abdominal pain with increased belching and metallic taste in mouth - I am more concern for gastric ulcer.  Educated patient on following up with a GI provider.  Patient stating that he is already established with an outpatient GI provider and will call them upon discharge for follow-up appointment in the next couple days. Patient requesting refill of his pantoprazole .  Also states he would like some Zofran  as this helped with his symptoms earlier today.  I have sent this prescription to his pharmacy. Staffed with Dr. Lenor who agrees with plan. I have reviewed the patients home medicines and have made adjustments as needed The patient has  been appropriately medically screened and/or stabilized in the ED. I have low suspicion for any other emergent medical condition which would require further screening, evaluation or treatment in the ED or require inpatient management. At time of discharge the patient is hemodynamically stable and in no acute distress. I have discussed work-up results and diagnosis with patient and answered all questions. Patient is agreeable with discharge plan. We discussed strict return precautions for returning to the emergency department and they verbalized understanding.     Social Determinants of Health:  geriatric       Final diagnoses:  Gastroesophageal reflux disease, unspecified whether esophagitis present  Epigastric pain    ED Discharge Orders          Ordered    ondansetron  (ZOFRAN -ODT) 4 MG disintegrating tablet  Every 8 hours PRN        02/07/24 1408    pantoprazole  (PROTONIX ) 40 MG tablet  Daily        02/07/24 1408               Hoy Nidia FAL[place], [place] 02/07/24 1504    Lenor Hollering, MD 02/07/24 1523

## 2024-02-07 NOTE — ED Notes (Signed)
 Reviewed discharge instructions, medications, and home care with pt. Pt verbalized understanding and had no further questions. Pt exited ED without complications.

## 2024-02-07 NOTE — ED Triage Notes (Signed)
 Patient reports abdominal pain that has been increasing over the last few days. He also has indigestion as well. Worse after eating. Reports some nausea as well.

## 2024-02-09 DIAGNOSIS — R110 Nausea: Secondary | ICD-10-CM | POA: Diagnosis not present

## 2024-02-09 DIAGNOSIS — K5900 Constipation, unspecified: Secondary | ICD-10-CM | POA: Diagnosis not present

## 2024-02-09 DIAGNOSIS — R1010 Upper abdominal pain, unspecified: Secondary | ICD-10-CM | POA: Diagnosis not present

## 2024-02-09 DIAGNOSIS — R634 Abnormal weight loss: Secondary | ICD-10-CM | POA: Diagnosis not present

## 2024-02-09 DIAGNOSIS — K219 Gastro-esophageal reflux disease without esophagitis: Secondary | ICD-10-CM | POA: Diagnosis not present

## 2024-02-20 DIAGNOSIS — L814 Other melanin hyperpigmentation: Secondary | ICD-10-CM | POA: Diagnosis not present

## 2024-02-20 DIAGNOSIS — L821 Other seborrheic keratosis: Secondary | ICD-10-CM | POA: Diagnosis not present

## 2024-02-20 DIAGNOSIS — D1801 Hemangioma of skin and subcutaneous tissue: Secondary | ICD-10-CM | POA: Diagnosis not present

## 2024-03-08 DIAGNOSIS — K219 Gastro-esophageal reflux disease without esophagitis: Secondary | ICD-10-CM | POA: Diagnosis not present

## 2024-03-08 DIAGNOSIS — K5900 Constipation, unspecified: Secondary | ICD-10-CM | POA: Diagnosis not present

## 2024-03-08 DIAGNOSIS — R1010 Upper abdominal pain, unspecified: Secondary | ICD-10-CM | POA: Diagnosis not present

## 2024-03-08 DIAGNOSIS — I10 Essential (primary) hypertension: Secondary | ICD-10-CM | POA: Diagnosis not present

## 2024-03-08 DIAGNOSIS — K4020 Bilateral inguinal hernia, without obstruction or gangrene, not specified as recurrent: Secondary | ICD-10-CM | POA: Diagnosis not present

## 2024-03-08 DIAGNOSIS — N183 Chronic kidney disease, stage 3 unspecified: Secondary | ICD-10-CM | POA: Diagnosis not present

## 2024-03-14 DIAGNOSIS — K219 Gastro-esophageal reflux disease without esophagitis: Secondary | ICD-10-CM | POA: Diagnosis not present

## 2024-03-14 DIAGNOSIS — R110 Nausea: Secondary | ICD-10-CM | POA: Diagnosis not present

## 2024-03-14 DIAGNOSIS — R634 Abnormal weight loss: Secondary | ICD-10-CM | POA: Diagnosis not present

## 2024-03-14 DIAGNOSIS — R1010 Upper abdominal pain, unspecified: Secondary | ICD-10-CM | POA: Diagnosis not present

## 2024-03-14 DIAGNOSIS — K5900 Constipation, unspecified: Secondary | ICD-10-CM | POA: Diagnosis not present

## 2024-04-13 ENCOUNTER — Ambulatory Visit: Payer: Self-pay | Admitting: General Surgery

## 2024-04-13 ENCOUNTER — Telehealth (HOSPITAL_BASED_OUTPATIENT_CLINIC_OR_DEPARTMENT_OTHER): Payer: Self-pay

## 2024-04-13 NOTE — Telephone Encounter (Signed)
" ° °  Name: Lucas Hardy  DOB: 12/15/1945  MRN: [id number]  Primary Cardiologist: Oneil Parchment, MD  Chart reviewed as part of pre-operative protocol coverage. Because of [name]'s past medical history and time since last visit, he will require a follow-up in-office visit in order to better assess preoperative cardiovascular risk.  Pre-op covering staff: - Please schedule appointment and call patient to inform them. If patient already had an upcoming appointment within acceptable timeframe, please add pre-op clearance to the appointment notes so provider is aware. - Please contact requesting surgeon's office via preferred method (i.e, phone, fax) to inform them of need for appointment prior to surgery.   Berline Semrad, [place]  04/13/2024, 1:02 PM   "

## 2024-04-13 NOTE — Telephone Encounter (Signed)
 Called patient to schedule a office appointment for pre-op clearance. No answer left a vm to call back.

## 2024-04-13 NOTE — Telephone Encounter (Signed)
 Pt returned call regarding in OV to be cleared for upcoming procedure. Pt is scheduled to come in on 04/19/24. Please advise

## 2024-04-13 NOTE — Telephone Encounter (Signed)
"  ° °  Pre-operative Risk Assessment    Patient Name: Lucas Hardy  DOB: June 01, 1945 MRN: [id number]   Date of last office visit: 07/07/2023 with Dr. Jeffrie Date of next office visit: None  Request for Surgical Clearance    Procedure:  Inguinal hernia surgery  Date of Surgery:  Clearance TBD                                 Surgeon:  Cordella Idler, MD Surgeon's Group or Practice Name:  [facility]  Phone number:  [phone] Fax number:  [phone]   Type of Clearance Requested:   - Medical  - Pharmacy:  [facility]  -does not specify   Type of Anesthesia:  General    Additional requests/questions:  None  SignedPatrcia Iverson CROME   04/13/2024, 12:54 PM   "

## 2024-04-19 ENCOUNTER — Ambulatory Visit: Attending: Cardiology | Admitting: Cardiology

## 2024-04-19 VITALS — BP 134/74 | HR 50 | Ht 69.0 in | Wt 149.0 lb

## 2024-04-19 DIAGNOSIS — I2510 Atherosclerotic heart disease of native coronary artery without angina pectoris: Secondary | ICD-10-CM | POA: Diagnosis not present

## 2024-04-19 DIAGNOSIS — E782 Mixed hyperlipidemia: Secondary | ICD-10-CM | POA: Diagnosis not present

## 2024-04-19 DIAGNOSIS — Z01818 Encounter for other preprocedural examination: Secondary | ICD-10-CM

## 2024-04-19 NOTE — Progress Notes (Signed)
 " Cardiology Office Note:  .   Date:  04/19/2024  ID:  Lucas Hardy, DOB 1945-08-12, MRN [id number] PCP: Charlott Dorn LABOR, MD  [facility] Providers Cardiologist:  Oneil Parchment, MD    History of Present Illness: .   Lucas Hardy is a 79 y.o. male Discussed the use of AI scribe   History of Present Illness Lucas Hardy is a 79 year old male with hypertension and coronary artery disease who presents for cardiac clearance prior to hernia surgery. He is accompanied by a family member.  He has no recent chest pain or unusual shortness of breath. He experiences occasional lightheadedness upon standing, which he attributes to previous blood pressure medication, amlodipine , that was discontinued due to orthostatic symptoms.  He has a history of minor nonobstructive coronary artery disease, with a 25% stenosis in the distal RCA noted on a cardiac catheterization performed on May 31, 2017. A CT scan has shown coronary plaque, and he has grade one diastolic dysfunction. An echocardiogram from April 07, 2020, revealed mild mitral valve regurgitation, normal pump function, and a normal aortic root.  He has experienced significant unintentional weight loss, which has prompted further investigation. He recently underwent an endoscopy that revealed chronic gastritis, and he is currently taking Protonix  twice daily. Most foods upset his stomach, leading to a diet primarily consisting of cream potatoes. He also experiences frequent belching and discomfort.  He mentions a recent episode of bronchitis, for which he was prescribed antibiotics. He continues to experience some hoarseness. Additionally, he notes a history of elevated potassium levels, which led him to reduce his intake of bananas, and he is currently experiencing issues with high calcium  levels.  In terms of physical activity, he maintains a routine of walking four to five days a week.      ROS: No chest pain fevers  vomiting shortness of breath  Studies Reviewed: SABRA   EKG Interpretation Date/Time:  Thursday April 19 2024 15:45:59 EST Ventricular Rate:  50 PR Interval:  160 QRS Duration:  96 QT Interval:  438 QTC Calculation: 399 R Axis:   65  Text Interpretation: Sinus bradycardia When compared with ECG of 07-Feb-2024 13:31, No significant change since last tracing Confirmed by Parchment Oneil ([id number]) on 04/19/2024 4:10:59 PM    Results Radiology Coronary CT: Coronary atherosclerotic plaque present  Diagnostic Cardiac catheterization (05/31/2017): Minor nonobstructive coronary artery disease, 25% stenosis in distal right coronary artery Echocardiogram (04/07/2020): Mild mitral valve regurgitation, normal left ventricular systolic function, normal aortic root, grade 1 diastolic dysfunction Risk Assessment/Calculations:            Physical Exam:   VS:  BP 134/74 (BP Location: Left Arm, Patient Position: Sitting, Cuff Size: Normal)   Pulse (!) 50   Ht 5' 9 (1.753 m)   Wt 149 lb (67.6 kg)   SpO2 95%   BMI 22.00 kg/m    Wt Readings from Last 3 Encounters:  04/19/24 149 lb (67.6 kg)  08/30/23 162 lb (73.5 kg)  07/07/23 159 lb 9.6 oz (72.4 kg)    GEN: Well nourished, well developed in no acute distress, thinner NECK: No JVD; No carotid bruits CARDIAC: RRR, no murmurs, no rubs, no gallops RESPIRATORY:  Clear to auscultation without rales, wheezing or rhonchi  ABDOMEN: Soft, non-tender, non-distended EXTREMITIES:  No edema; No deformity   ASSESSMENT AND PLAN: .    Assessment and Plan Assessment & Plan Preoperative cardiac evaluation for hernia repair Cardiac evaluation for upcoming hernia  repair surgery. No high-risk cardiac symptoms. EKG shows bradycardia but not dangerously slow. Mild mitral valve regurgitation noted on previous echocardiogram, not concerning at this time. Proceeding with surgery is deemed safe. - Proceed with hernia repair surgery as planned.  Low overall cardiac risk.   Bradycardia known and stable.  He is able to complete greater than 4 METS of activity.   Atherosclerotic coronary artery disease without angina Minor nonobstructive coronary artery disease with 25% stenosis in distal RCA. No current angina or chest pain. Coronary plaque noted on CT. - Continue current management and monitoring.  Mild mitral valve regurgitation Noted on echocardiogram from 2022. Condition is stable and not concerning at this time. - Continue to monitor for symptoms such as increased shortness of breath or fatigue.  Grade 1 diastolic dysfunction No current symptoms or changes. Condition is stable. - Continue monitoring.  Bradycardia EKG shows bradycardia with heart rate in the low fifties. Not considered dangerously slow. Anesthesiologist may note this during surgery, but it is not concerning for him. - Proceed with surgery as planned.  History of orthostatic hypotension Orthostatic hypotension previously managed by discontinuing amlodipine . No current symptoms reported. - Continue to monitor for symptoms of orthostatic hypotension.  Unintentional weight loss Significant unintentional weight loss noted. Recent endoscopy showed chronic gastritis. Currently on Protonix  twice daily. Dietary modifications advised to manage symptoms. - Continue Protonix  twice daily. - Advised dietary modifications to include simple foods like white rice and baked chicken.         Dispo: May proceed with surgery.  Signed, Oneil Parchment, MD  "

## 2024-04-19 NOTE — Patient Instructions (Signed)
 Medication Instructions:  The current medical regimen is effective;  continue present plan and medications.  *If you need a refill on your cardiac medications before your next appointment, please call your pharmacy*  Follow-Up: At [facility], you and your health needs are our priority.  As part of our continuing mission to provide you with exceptional heart care, our providers are all part of one team.  This team includes your primary Cardiologist (physician) and Advanced Practice Providers or APPs (Physician Assistants and Nurse Practitioners) who all work together to provide you with the care you need, when you need it.  Your next appointment:   1 year(s)  Provider:   Oneil Parchment, MD    We recommend signing up for the patient portal called MyChart.  Sign up information is provided on this After Visit Summary.  MyChart is used to connect with patients for Virtual Visits (Telemedicine).  Patients are able to view lab/test results, encounter notes, upcoming appointments, etc.  Non-urgent messages can be sent to your provider as well.   To learn more about what you can do with MyChart, go to [email or web address].     OK to surgery

## 2024-06-05 ENCOUNTER — Ambulatory Visit (HOSPITAL_COMMUNITY): Admit: 2024-06-05 | Admitting: General Surgery
